# Patient Record
Sex: Male | Born: 1955 | Race: White | Hispanic: No | Smoking: Current every day smoker
Health system: Southern US, Community
[De-identification: ages and names within clinical notes are randomized; demographics above are authoritative.]

---

## 2008-10-14 ENCOUNTER — Emergency Department (HOSPITAL_COMMUNITY): Admission: EM | Admit: 2008-10-14 | Discharge: 2008-10-15 | Payer: Self-pay | Admitting: Emergency Medicine

## 2014-01-22 ENCOUNTER — Encounter (HOSPITAL_COMMUNITY): Payer: Self-pay | Admitting: Emergency Medicine

## 2014-01-22 ENCOUNTER — Emergency Department (HOSPITAL_COMMUNITY)
Admission: EM | Admit: 2014-01-22 | Discharge: 2014-01-23 | Disposition: A | Discharge status: Other Acute Inpatient Hospital | Payer: Federal, State, Local not specified - Other | Attending: Emergency Medicine | Admitting: Emergency Medicine

## 2014-01-22 DIAGNOSIS — Z720 Tobacco use: Secondary | ICD-10-CM | POA: Insufficient documentation

## 2014-01-22 DIAGNOSIS — R000 Tachycardia, unspecified: Secondary | ICD-10-CM | POA: Insufficient documentation

## 2014-01-22 DIAGNOSIS — F10230 Alcohol dependence with withdrawal, uncomplicated: Secondary | ICD-10-CM | POA: Insufficient documentation

## 2014-01-22 LAB — CBC
HEMATOCRIT: 46.7 % (ref 39.0–52.0)
HEMOGLOBIN: 16.3 g/dL (ref 13.0–17.0)
MCH: 34.4 pg — AB (ref 26.0–34.0)
MCHC: 34.9 g/dL (ref 30.0–36.0)
MCV: 98.5 fL (ref 78.0–100.0)
Platelets: 183 10*3/uL (ref 150–400)
RBC: 4.74 MIL/uL (ref 4.22–5.81)
RDW: 12.8 % (ref 11.5–15.5)
WBC: 6.8 10*3/uL (ref 4.0–10.5)

## 2014-01-22 LAB — RAPID URINE DRUG SCREEN, HOSP PERFORMED
Amphetamines: NOT DETECTED
BARBITURATES: NOT DETECTED
Benzodiazepines: NOT DETECTED
COCAINE: NOT DETECTED
OPIATES: NOT DETECTED
TETRAHYDROCANNABINOL: NOT DETECTED

## 2014-01-22 LAB — COMPREHENSIVE METABOLIC PANEL
ALBUMIN: 4.2 g/dL (ref 3.5–5.2)
ALK PHOS: 105 U/L (ref 39–117)
ALT: 142 U/L — ABNORMAL HIGH (ref 0–53)
AST: 139 U/L — ABNORMAL HIGH (ref 0–37)
Anion gap: 17 — ABNORMAL HIGH (ref 5–15)
BUN: 10 mg/dL (ref 6–23)
CO2: 20 mEq/L (ref 19–32)
CREATININE: 0.97 mg/dL (ref 0.50–1.35)
Calcium: 10 mg/dL (ref 8.4–10.5)
Chloride: 96 mEq/L (ref 96–112)
GFR calc Af Amer: >90 mL/min (ref >90)
GFR calc non Af Amer: >90 mL/min (ref >90)
GLUCOSE: 95 mg/dL (ref 70–99)
Potassium: 4.4 mEq/L (ref 3.7–5.3)
Sodium: 133 mEq/L — ABNORMAL LOW (ref 137–147)
TOTAL PROTEIN: 8.3 g/dL (ref 6.0–8.3)
Total Bilirubin: 0.8 mg/dL (ref 0.3–1.2)

## 2014-01-22 LAB — SALICYLATE LEVEL: Salicylate Lvl: <2 mg/dL — ABNORMAL LOW (ref 2.8–20.0)

## 2014-01-22 LAB — ETHANOL: ALCOHOL ETHYL (B): 13 mg/dL — AB (ref 0–11)

## 2014-01-22 LAB — ACETAMINOPHEN LEVEL: Acetaminophen (Tylenol), Serum: <15 ug/mL (ref 10–30)

## 2014-01-22 MED ORDER — ACETAMINOPHEN 325 MG PO TABS
650.0000 mg | ORAL_TABLET | ORAL | Status: DC | PRN
Start: 2014-01-22 — End: 2014-01-23

## 2014-01-22 MED ORDER — VITAMIN B-1 100 MG PO TABS: 100.0000 mg | ORAL_TABLET | Freq: Every day | ORAL | Status: DC

## 2014-01-22 MED ORDER — LORAZEPAM 2 MG/ML IJ SOLN: 0.0000 mg | Freq: Four times a day (QID) | INTRAMUSCULAR | Status: DC

## 2014-01-22 MED ORDER — LORAZEPAM 1 MG PO TABS
0.0000 mg | ORAL_TABLET | Freq: Two times a day (BID) | ORAL | Status: DC
  Administered 2014-01-23: 2 mg via ORAL
  Filled 2014-01-22: qty 2

## 2014-01-22 MED ORDER — ALUM & MAG HYDROXIDE-SIMETH 200-200-20 MG/5ML PO SUSP: 30.0000 mL | ORAL | Status: DC | PRN

## 2014-01-22 MED ORDER — LORAZEPAM 2 MG/ML IJ SOLN: 0.0000 mg | Freq: Two times a day (BID) | INTRAMUSCULAR | Status: DC

## 2014-01-22 MED ORDER — IBUPROFEN 200 MG PO TABS: 600.0000 mg | ORAL_TABLET | Freq: Three times a day (TID) | ORAL | Status: DC | PRN

## 2014-01-22 MED ORDER — ONDANSETRON HCL 4 MG PO TABS: 4.0000 mg | ORAL_TABLET | Freq: Three times a day (TID) | ORAL | Status: DC | PRN

## 2014-01-22 MED ORDER — THIAMINE HCL 100 MG/ML IJ SOLN: 100.0000 mg | Freq: Every day | INTRAMUSCULAR | Status: DC

## 2014-01-22 MED ORDER — VITAMIN B-1 100 MG PO TABS
100.0000 mg | ORAL_TABLET | Freq: Every day | ORAL | Status: DC
  Administered 2014-01-22 – 2014-01-23 (×2): 100 mg via ORAL
  Filled 2014-01-22 (×2): qty 1

## 2014-01-22 MED ORDER — LORAZEPAM 1 MG PO TABS
0.0000 mg | ORAL_TABLET | Freq: Four times a day (QID) | ORAL | Status: DC
  Administered 2014-01-22: 2 mg via ORAL
  Administered 2014-01-23: 1 mg via ORAL
  Filled 2014-01-22: qty 2
  Filled 2014-01-22: qty 1

## 2014-01-22 MED ORDER — NICOTINE 21 MG/24HR TD PT24
21.0000 mg | MEDICATED_PATCH | Freq: Every day | TRANSDERMAL | Status: DC
  Administered 2014-01-22 – 2014-01-23 (×2): 21 mg via TRANSDERMAL
  Filled 2014-01-22 (×2): qty 1

## 2014-01-22 MED ORDER — LORAZEPAM 1 MG PO TABS
1.0000 mg | ORAL_TABLET | Freq: Three times a day (TID) | ORAL | Status: DC | PRN
  Administered 2014-01-22: 1 mg via ORAL
  Filled 2014-01-22: qty 1

## 2014-01-22 NOTE — ED Provider Notes (Signed)
CSN: [id number]     Arrival date & time 01/22/14  1504 History   First MD Initiated Contact with Patient 01/22/14 1532     Chief Complaint  Patient presents with  . etoh detox      (Consider location/radiation/quality/duration/timing/severity/associated sxs/prior Treatment) HPI Comments: Patient is a 58 year old male with no past medical history who presents requesting alcohol detox. Patient reports drinking daily for the past 40 years. He drinks about 6-40oz beers daily. He denies any illicit drug use. He smokes cigarettes daily. He denies SI/HI. No aggravating/alleviaitng factors. No other associated symptoms.    History reviewed. No pertinent past medical history. History reviewed. No pertinent past surgical history. No family history on file. History  Substance Use Topics  . Smoking status: Current Every Day Smoker  . Smokeless tobacco: Not on file  . Alcohol Use: Yes    Review of Systems  Constitutional: Negative for fever, chills and fatigue.       Substance abuse  HENT: Negative for trouble swallowing.   Eyes: Negative for visual disturbance.  Respiratory: Negative for shortness of breath.   Cardiovascular: Negative for chest pain and palpitations.  Gastrointestinal: Negative for nausea, vomiting, abdominal pain and diarrhea.  Genitourinary: Negative for dysuria and difficulty urinating.  Musculoskeletal: Negative for arthralgias and neck pain.  Skin: Negative for color change.  Neurological: Negative for dizziness and weakness.  Psychiatric/Behavioral: Negative for dysphoric mood.      Allergies  Review of patient's allergies indicates no known allergies.  Home Medications   Prior to Admission medications   Not on File   BP 138/87 mmHg  Pulse 109  Temp(Src) 98.6 F (37 C) (Oral)  Resp 20  SpO2 98% Physical Exam  Constitutional: He appears well-developed and well-nourished. No distress.  HENT:  Head: Normocephalic and atraumatic.  Eyes: Conjunctivae  and EOM are normal.  Neck: Normal range of motion.  Cardiovascular: Regular rhythm.  Exam reveals no gallop and no friction rub.   No murmur heard. tachycardic  Pulmonary/Chest: Effort normal and breath sounds normal. He has no wheezes. He has no rales. He exhibits no tenderness.  Abdominal: Soft. He exhibits no distension. There is no tenderness. There is no rebound.  Musculoskeletal: Normal range of motion.  Neurological: He is alert.  Speech is goal-oriented. Generalized tremors consistent with DTs.   Skin: Skin is warm and dry.  Psychiatric: He has a normal mood and affect. His behavior is normal.  Nursing note and vitals reviewed.   ED Course  Procedures (including critical care time) Labs Review Labs Reviewed  CBC - Abnormal; Notable for the following:    MCH 34.4 (*)    All other components within normal limits  COMPREHENSIVE METABOLIC PANEL - Abnormal; Notable for the following:    Sodium 133 (*)    AST 139 (*)    ALT 142 (*)    Anion gap 17 (*)    All other components within normal limits  ETHANOL - Abnormal; Notable for the following:    Alcohol, Ethyl (B) 13 (*)    All other components within normal limits  SALICYLATE LEVEL - Abnormal; Notable for the following:    Salicylate Lvl <2.0 (*)    All other components within normal limits  ACETAMINOPHEN LEVEL  URINE RAPID DRUG SCREEN (HOSP PERFORMED)    Imaging Review No results found.   EKG Interpretation None      MDM   Final diagnoses:  None    3:50 PM Labs pending.  Patient has DTs at the time. Patient is tachycardic with remaining vitals stable.     Emilia BeckKaitlyn Sadhana Frater, PA-C 01/23/14 1517  Gerhard Munchobert Lockwood, MD 01/23/14 1530

## 2014-01-22 NOTE — ED Notes (Signed)
Called PA an informed that pt's ciwa is 16. Pt does not have ativan attached to ciwa protocol. PA stated she would assess pt status.

## 2014-01-22 NOTE — Progress Notes (Signed)
CSW met with Pt. There was no family at bedside. The Pt says he does not feel as though he has a good support system. Pt says he has been drinking for 30 years, but has been drinking excessively for about a year because he has been unemployed.  Pt informed CSW that when he was employed with an income he consumed less alcohol.  Pt is homeless and does not have any insurance on file. CSW will start documentation for [facility] and [facility]. CSW will also give Pt community resources for shelter and food.   , LCSWA [phone] ED CSW 01/22/2014 7:46 PM     

## 2014-01-22 NOTE — ED Notes (Signed)
Pt requesting detox from ETOH, last use right before coming into ED. Denies SI/HI.

## 2014-01-22 NOTE — Progress Notes (Signed)
  CARE MANAGEMENT ED NOTE 01/22/2014  Patient:  Justin Pugh,Justin Pugh   Account Number:  [id number]  Date Initiated:  01/22/2014  Documentation initiated by:  Radford PaxFERRERO,Kyreese Chio  Subjective/Objective Assessment:   Patient presents to Ed with requesting detox from alcohol.     Subjective/Objective Assessment Detail:   Patient without pmhx per chart review     Action/Plan:   TTS consult   Action/Plan Detail:   Anticipated DC Date:       Status Recommendation to Physician:   Result of Recommendation:    Other ED Services  Consult Working Plan   In-house referral  Clinical Social Worker   DC Associate Professorlanning Services  Other  PCP issues    Choice offered to / List presented to:            Status of service:  Completed, signed off  ED Comments:   ED Comments Detail:  EDCM spoke to patient at bedside.  Patient confirms he does [place] have a pcp [place]. Patient reports he is homeless and living in a tent. [facility]   EDCM provide patient with [facility], informed patient of services there and walk in times.  EDCM also provided patient with list of pcps who accept self pay patients, list of discount pharmacies and websites needymeds.org and GoodRX.com for medication assistance, phone number to inquire about the orange card, phone number to inquire about Mediciad, phone number to inquire about the Affordable Care Act, financial resources in the community such as local churches, salvation army, urban ministries, and dental assistance for uninsured patients. EDCM placed consult to EDSW for homelessness.  [facility] discussed patient with EDP at 1753pm regarding DT's?  As per EDP, patient "not really having DT's, he is more alcohol dependent."  Patient is currently on CIWA protocol. EDCM discussed patient with EDRN.  EDCM discussed having disposition for patient within twenty four hours with EDSW. No further EDCM needs at this  time.

## 2014-01-22 NOTE — BH Assessment (Signed)
Assessment Note  Justin Pugh is an 58 y.o. male.  -Clinician talked to [facility], PA about need for TTS.  Patient is requesting detox from ETOH.  No other drug use and no SI or HI.  Patient says that he does want to get detox from ETOH.  He drinks three to four '40s per day for the last year.  Patient drank around 14:00 today prior to coming in.  BAL is only 13 however and that was taken at 16:55.  Patient reports having tremors and cramps, nausea, chills, sweats when detoxing.  Patient denies any HI or SI or any previous attempts.  Pt denies A/V hallucinations.  Depression related to stressors of no job and being homeless & living in a tent.  Patient said that he has a court date on 12/10 for trespassing, misconduct and soliciting.  Patient says that he was told that he needed to get into a detox program prior to this court date and have proof of same.  Clinician called [facility] and spoke to [facility] who said that they could take the referral after 08:30 tomorrow (12/04).  [facility] has no beds available tonight.  No beds at [facility] either.  -Pt care was discussed with Verne Spurr, PA-C who recommended that patient stay at [facility] overnight then be referred out to [facility] in the AM.  Also be evaluated for his detox symptoms at that time.  PA [facility] notified and in agreement.  Axis I: 303.90 ETOH use d/o, severe Axis II: Deferred Axis III: History reviewed. No pertinent past medical history. Axis IV: economic problems, housing problems, occupational problems, problems related to legal system/crime, problems with access to health care services and problems with primary support group Axis V: 31-40 impairment in reality testing  Past Medical History: History reviewed. No pertinent past medical history.  History reviewed. No pertinent past surgical history.  Family History: No family history on file.  Social History:  reports that he has been smoking.  He does not have any smokeless tobacco  history on file. He reports that he drinks alcohol. His drug history is not on file.  Additional Social History:  Alcohol / Drug Use Pain Medications: None Prescriptions: None Over the Counter: None History of alcohol / drug use?: Yes Withdrawal Symptoms: Cramps, Fever / Chills, Nausea / Vomiting, Patient aware of relationship between substance abuse and physical/medical complications, Sweats, Tremors, Weakness Substance #1 Name of Substance 1: ETOH (beer) 1 - Age of First Use: Teens 1 - Amount (size/oz): Three to four 40's per day 1 - Frequency: Daily use 1 - Duration: One year at that rate 1 - Last Use / Amount: 12/03 around 14:00 was last drink  CIWA: CIWA-Ar BP: (!) 147/129 mmHg Pulse Rate: 94 Nausea and Vomiting: 2 Tactile Disturbances: mild itching, pins and needles, burning or numbness Tremor: six Auditory Disturbances: not present Paroxysmal Sweats: two Visual Disturbances: not present Anxiety: two Headache, Fullness in Head: very mild Agitation: somewhat more than normal activity Orientation and Clouding of Sensorium: oriented and can do serial additions CIWA-Ar Total: 16 COWS:    Allergies: No Known Allergies  Home Medications:  (Not in a hospital admission)  OB/GYN Status:  No LMP for male patient.  General Assessment Data Location of Assessment: [facility] ED Is this a Tele or Face-to-Face Assessment?: Face-to-Face Is this an Initial Assessment or a Re-assessment for this encounter?: Initial Assessment Living Arrangements: Other (Comment) (Homeless, living in a tent.) Can pt return to current living arrangement?: Yes Admission  Status: Voluntary Is patient capable of signing voluntary admission?: Yes Transfer from: Acute Hospital Referral Source: Self/Family/Friend     [facility] Living Arrangements: Other (Comment) (Homeless, living in a tent.) Name of Psychiatrist: None Name of Therapist: N/A     Risk to self with the past 6 months Suicidal  Ideation: No Suicidal Intent: No Is patient at risk for suicide?: No Suicidal Plan?: No Access to Means: No What has been your use of drugs/alcohol within the last 12 months?: ETOH use daily Previous Attempts/Gestures: No How many times?: 0 Other Self Harm Risks: None Triggers for Past Attempts: None known Intentional Self Injurious Behavior: None Family Suicide History: No Recent stressful life event(s): Legal Issues, Other (Comment) (Homelessness (living in a tent).) Persecutory voices/beliefs?: Yes Depression: Yes Depression Symptoms: Despondent, Isolating Substance abuse history and/or treatment for substance abuse?: Yes Suicide prevention information given to non-admitted patients: Not applicable  Risk to Others within the past 6 months Homicidal Ideation: No Thoughts of Harm to Others: No Current Homicidal Intent: No Current Homicidal Plan: No Access to Homicidal Means: No Identified Victim: No one History of harm to others?: No Assessment of Violence: None Noted Violent Behavior Description: None noted Does patient have access to weapons?: No Criminal Charges Pending?: Yes Describe Pending Criminal Charges: Trespassing, soliciting & misconduct Does patient have a court date: Yes Court Date: 01/29/14  Psychosis Hallucinations: None noted Delusions: None noted  Mental Status Report Appear/Hygiene: Disheveled, In scrubs, Poor hygiene Eye Contact: Good Motor Activity: Freedom of movement, Restlessness Speech: Logical/coherent Level of Consciousness: Alert Mood: Anxious, Sad Affect: Anxious, Sad Anxiety Level: Moderate Thought Processes: Coherent, Relevant Judgement: Unimpaired Orientation: Person, Place, Time, Situation Obsessive Compulsive Thoughts/Behaviors: None  Cognitive Functioning Concentration: Decreased Memory: Remote Intact, Recent Impaired IQ: Average Insight: Fair Impulse Control: Poor Appetite: Good Weight Loss: 0 Weight Gain: 0 Sleep:  Decreased Total Hours of Sleep: 5 Vegetative Symptoms: None  ADLScreening [facility] Assessment Services) Patient's cognitive ability adequate to safely complete daily activities?: Yes Patient able to express need for assistance with ADLs?: Yes Independently performs ADLs?: Yes (appropriate for developmental age)  Prior Inpatient Therapy Prior Inpatient Therapy: No Prior Therapy Dates: N/A Prior Therapy Facilty/Provider(s): N/A Reason for Treatment: N/A  Prior Outpatient Therapy Prior Outpatient Therapy: No Prior Therapy Dates: N/a Prior Therapy Facilty/Provider(s): N/A Reason for Treatment: N/A  ADL Screening (condition at time of admission) Patient's cognitive ability adequate to safely complete daily activities?: Yes Is the patient deaf or have difficulty hearing?: No Does the patient have difficulty seeing, even when wearing glasses/contacts?: No Does the patient have difficulty concentrating, remembering, or making decisions?: No Patient able to express need for assistance with ADLs?: Yes Does the patient have difficulty dressing or bathing?: No Independently performs ADLs?: Yes (appropriate for developmental age) Does the patient have difficulty walking or climbing stairs?: No Weakness of Legs: None Weakness of Arms/Hands: None  Home Assistive Devices/Equipment Home Assistive Devices/Equipment: None    Abuse/Neglect Assessment (Assessment to be complete while patient is alone) Physical Abuse: Denies Verbal Abuse: Denies Sexual Abuse: Denies Exploitation of patient/patient's resources: Denies Self-Neglect: Denies     Merchant navy officerAdvance Directives (For Healthcare) Does patient have an advance directive?: No Would patient like information on creating an advanced directive?: No - patient declined information    Additional Information 1:1 In Past 12 Months?: No CIRT Risk: No Elopement Risk: No Does patient have medical clearance?: Yes     Disposition:  Disposition Initial  Assessment Completed for this Encounter:  Yes Disposition of Patient: Referred to [facility], [facility]  On Site Evaluation by:   Reviewed with Physician:    Beatriz StallionHarvey, Jayvyn Haselton Ray 01/22/2014 10:15 PM

## 2014-01-22 NOTE — Progress Notes (Signed)
CSW called [facility] and [facility] there is no bed availability. [facility] advised CSW to try again in the morning and [facility] stated they only have 1 male bed available. CSW is gathering Homeless shelter and food bank information to give to Pt.  Trish MageBrittney Lealand Elting, LCSWA [phone] ED CSW 01/22/2014 10:45 PM

## 2014-01-23 ENCOUNTER — Observation Stay (HOSPITAL_COMMUNITY)
Admission: AD | Admit: 2014-01-23 | Discharge: 2014-01-24 | Disposition: A | Discharge status: Home / Self Care | Payer: Federal, State, Local not specified - Other | Source: Intra-hospital | Attending: Psychiatry | Admitting: Psychiatry

## 2014-01-23 ENCOUNTER — Encounter (HOSPITAL_COMMUNITY): Payer: Self-pay | Admitting: *Deleted

## 2014-01-23 DIAGNOSIS — Z590 Homelessness: Secondary | ICD-10-CM | POA: Diagnosis not present

## 2014-01-23 DIAGNOSIS — F10239 Alcohol dependence with withdrawal, unspecified: Principal | ICD-10-CM | POA: Insufficient documentation

## 2014-01-23 DIAGNOSIS — F1020 Alcohol dependence, uncomplicated: Secondary | ICD-10-CM | POA: Diagnosis present

## 2014-01-23 DIAGNOSIS — F1919 Other psychoactive substance abuse with unspecified psychoactive substance-induced disorder: Secondary | ICD-10-CM

## 2014-01-23 DIAGNOSIS — F17210 Nicotine dependence, cigarettes, uncomplicated: Secondary | ICD-10-CM | POA: Insufficient documentation

## 2014-01-23 DIAGNOSIS — F419 Anxiety disorder, unspecified: Secondary | ICD-10-CM | POA: Insufficient documentation

## 2014-01-23 DIAGNOSIS — F10230 Alcohol dependence with withdrawal, uncomplicated: Secondary | ICD-10-CM | POA: Diagnosis present

## 2014-01-23 MED ORDER — LORAZEPAM 1 MG PO TABS: 0.0000 mg | ORAL_TABLET | Freq: Two times a day (BID) | ORAL | Status: DC

## 2014-01-23 MED ORDER — VITAMIN B-1 100 MG PO TABS
100.0000 mg | ORAL_TABLET | Freq: Every day | ORAL | Status: DC
  Administered 2014-01-24: 100 mg via ORAL
  Filled 2014-01-23 (×2): qty 1

## 2014-01-23 MED ORDER — IBUPROFEN 600 MG PO TABS: 600.0000 mg | ORAL_TABLET | Freq: Three times a day (TID) | ORAL | Status: DC | PRN

## 2014-01-23 MED ORDER — ALUM & MAG HYDROXIDE-SIMETH 200-200-20 MG/5ML PO SUSP: 30.0000 mL | ORAL | Status: DC | PRN

## 2014-01-23 MED ORDER — LORAZEPAM 1 MG PO TABS
1.0000 mg | ORAL_TABLET | Freq: Every day | ORAL | Status: DC
Start: 2014-01-26 — End: 2014-01-24

## 2014-01-23 MED ORDER — ACETAMINOPHEN 325 MG PO TABS: 650.0000 mg | ORAL_TABLET | ORAL | Status: DC | PRN

## 2014-01-23 MED ORDER — LORAZEPAM 1 MG PO TABS: 1.0000 mg | ORAL_TABLET | Freq: Two times a day (BID) | ORAL | Status: DC

## 2014-01-23 MED ORDER — PNEUMOCOCCAL 13-VAL CONJ VACC IM SUSP
0.5000 mL | INTRAMUSCULAR | Status: AC
  Administered 2014-01-24: 0.5 mL via INTRAMUSCULAR
  Filled 2014-01-23: qty 0.5

## 2014-01-23 MED ORDER — INFLUENZA VAC SPLIT QUAD 0.5 ML IM SUSY
0.5000 mL | PREFILLED_SYRINGE | INTRAMUSCULAR | Status: AC
  Administered 2014-01-24: 0.5 mL via INTRAMUSCULAR
  Filled 2014-01-23: qty 0.5

## 2014-01-23 MED ORDER — ACETAMINOPHEN 325 MG PO TABS: 650.0000 mg | ORAL_TABLET | Freq: Four times a day (QID) | ORAL | Status: DC | PRN

## 2014-01-23 MED ORDER — LORAZEPAM 1 MG PO TABS
1.0000 mg | ORAL_TABLET | Freq: Three times a day (TID) | ORAL | Status: DC
  Administered 2014-01-24 (×2): 1 mg via ORAL
  Filled 2014-01-23: qty 1

## 2014-01-23 MED ORDER — MAGNESIUM HYDROXIDE 400 MG/5ML PO SUSP: 30.0000 mL | Freq: Every day | ORAL | Status: DC | PRN

## 2014-01-23 MED ORDER — LORAZEPAM 1 MG PO TABS
1.0000 mg | ORAL_TABLET | Freq: Four times a day (QID) | ORAL | Status: AC
  Administered 2014-01-23 (×2): 1 mg via ORAL

## 2014-01-23 MED ORDER — LORAZEPAM 1 MG PO TABS: 0.0000 mg | ORAL_TABLET | Freq: Four times a day (QID) | ORAL | Status: DC

## 2014-01-23 MED ORDER — HYDROXYZINE HCL 25 MG PO TABS: 25.0000 mg | ORAL_TABLET | Freq: Four times a day (QID) | ORAL | Status: DC | PRN

## 2014-01-23 MED ORDER — LORAZEPAM 1 MG PO TABS: 1.0000 mg | ORAL_TABLET | Freq: Three times a day (TID) | ORAL | Status: DC | PRN

## 2014-01-23 MED ORDER — NICOTINE 21 MG/24HR TD PT24
21.0000 mg | MEDICATED_PATCH | Freq: Every day | TRANSDERMAL | Status: DC
  Administered 2014-01-24: 21 mg via TRANSDERMAL
  Filled 2014-01-23 (×2): qty 1

## 2014-01-23 MED ORDER — LOPERAMIDE HCL 2 MG PO CAPS: 2.0000 mg | ORAL_CAPSULE | ORAL | Status: DC | PRN

## 2014-01-23 MED ORDER — ADULT MULTIVITAMIN W/MINERALS CH
1.0000 | ORAL_TABLET | Freq: Every day | ORAL | Status: DC
  Administered 2014-01-24: 1 via ORAL
  Filled 2014-01-23 (×3): qty 1

## 2014-01-23 MED ORDER — ONDANSETRON HCL 4 MG PO TABS: 4.0000 mg | ORAL_TABLET | Freq: Three times a day (TID) | ORAL | Status: DC | PRN

## 2014-01-23 NOTE — H&P (Signed)
Psychiatric Observation Admission Assessment Adult  Patient Identification:  Justin Pugh Date of Evaluation:  01/23/2014 Chief Complaint:  ETOH USE DISORDER   Subjective:   Justin Pugh is a 58 year old Caucasian Male who presented to the [facility] ED for Alcohol Detoxification.  Patient reports he drinks 4-6 40 oz beers every day.  Relates that he has had a stable pattern of use since he was around 58 years old. Denies drug use.  Denies any past psychiatric hospitalization or recovery programs but feels he needs to obtain sobriety as he has multiple misdemeanor charges pending and "everything just keeps getting worse" History of Present Illness: Justin Pugh is a Caucasian male who looks older than his stated age. He presented to the ED for Alcohol Detox and was subsequently admitted to the observation unit for stabilization of his alcohol withdrawal.  Patient has a long history of substance use without a pattern of recovery.  Denies every seeking substance abuse treatment or using a 12 step program and feels that he needs to obtain sobriety as he has been getting into increasingly more trouble while living on the streets.  Patient has multiple pending charges and a court date scheduled for disorderly conduct and trespassing on December 10th.  No past history of psychiatric hospitalization or outpatient management.  No psychotropic medications. No previous SI attempts  Denies signs and symptoms of depression.  Denies Suicidal or Homicidal ideation.  Denies auditory or visual hallucinations.  No evidence of delusions, Does not attend to internal stimuli.  Elements:  Location:  long standing substance use and wanting to obtain sobriety. Quality:  acute. Severity:  moderate. Timing:  acute exacerbation of a chronic problem . Duration:  many years . Context:  homelessness, loss of job. Associated Signs/Synptoms: Depression Symptoms:  anxiety, insomnia, (Hypo) Manic Symptoms:  Denies  Anxiety Symptoms:   Excessive Worry, Psychotic Symptoms: denies  PTSD Symptoms: Negative Total Time spent with patient: 30 minutes  Psychiatric Specialty Exam: Physical Exam  Constitutional: He appears well-developed and well-nourished.  Skin: Skin is warm and dry.  Psychiatric: He has a normal mood and affect. His behavior is normal. Thought content normal.    Review of Systems  Constitutional: Positive for diaphoresis.  HENT: Negative.   Eyes: Negative.   Respiratory: Negative.   Cardiovascular: Negative.   Gastrointestinal: Negative.   Skin: Negative.   Neurological: Positive for tremors.  Psychiatric/Behavioral: Positive for substance abuse. The patient is nervous/anxious and has insomnia.     Blood pressure 132/96, pulse 114, temperature 97.5 F (36.4 C), temperature source Oral, resp. rate 18, height 6' (1.829 m), weight 95.255 kg (210 lb).Body mass index is 28.47 kg/(m^2).  General Appearance: Casual  Eye Contact::  Fair  Speech:  Clear and Coherent  Volume:  Normal  Mood:  Euthymic  Affect:  Congruent and Full Range  Thought Process:  Goal Directed, Linear and Logical  Orientation:  Full (Time, Place, and Person)  Thought Content:  NA  Suicidal Thoughts:  No  Homicidal Thoughts:  No  Memory:  Immediate;   Good Recent;   Good Remote;   Good  Judgement:  Fair  Insight:  Fair  Psychomotor Activity:  Normal  Concentration:  Fair  Recall:  [place] of Knowledge:Good  Language: Good  Akathisia:  No  Handed:  Right  AIMS (if indicated):     Assets:  Communication Skills Desire for Improvement Leisure Time  Sleep:       Musculoskeletal: Strength & Muscle  Tone: within normal limits Gait & Station: normal Patient leans: N/A  Past Psychiatric History: Diagnosis:  Hospitalizations:  Outpatient Care:  Substance Abuse Care:  Self-Mutilation:  Suicidal Attempts:  Violent Behaviors:   Past Medical History:  History reviewed. No pertinent past medical  history. None. Allergies:  No Known Allergies PTA Medications: No prescriptions prior to admission    Previous Psychotropic Medications:  Medication/Dose  No medications               Substance Abuse History in the last 12 months:  Yes.    Consequences of Substance Abuse: Medical Consequences:  elevated liver enzymes  Legal Consequences:  charges for disorderly conduct/ past DUI - served 4 years  Withdrawal Symptoms:   Diaphoresis Nausea Tremors  Social History:  reports that he has been smoking.  He does not have any smokeless tobacco history on file. He reports that he drinks alcohol. His drug history is not on file. 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 No pertinent family history.  Results for orders placed or performed during the hospital encounter of 01/22/14 (from the past 72 hour(s))  Acetaminophen level     Status: None   Collection Time: 01/22/14  4:22 PM  Result Value Ref Range   Acetaminophen (Tylenol), Serum <15.0 10 - 30 ug/mL    Comment:        THERAPEUTIC CONCENTRATIONS VARY SIGNIFICANTLY. A RANGE OF 10-30 ug/mL MAY BE AN EFFECTIVE CONCENTRATION FOR MANY PATIENTS. HOWEVER, SOME ARE BEST TREATED AT CONCENTRATIONS OUTSIDE THIS RANGE. ACETAMINOPHEN CONCENTRATIONS >150 ug/mL AT 4 HOURS AFTER INGESTION AND >50 ug/mL AT  12 HOURS AFTER INGESTION ARE OFTEN ASSOCIATED WITH TOXIC REACTIONS.   CBC     Status: Abnormal   Collection Time: 01/22/14  4:22 PM  Result Value Ref Range   WBC 6.8 4.0 - 10.5 K/uL   RBC 4.74 4.22 - 5.81 MIL/uL   Hemoglobin 16.3 13.0 - 17.0 g/dL   HCT 46.7 39.0 - 52.0 %   MCV 98.5 78.0 - 100.0 fL   MCH 34.4 (H) 26.0 - 34.0 pg   MCHC 34.9 30.0 - 36.0 g/dL   RDW 12.8 11.5 - 15.5 %   Platelets 183 150 - 400 K/uL  Comprehensive metabolic panel     Status: Abnormal   Collection Time: 01/22/14  4:22 PM  Result Value Ref Range   Sodium 133 (L) 137 - 147 mEq/L   Potassium 4.4 3.7 - 5.3 mEq/L   Chloride 96 96 - 112 mEq/L   CO2 20 19 - 32 mEq/L   Glucose, Bld 95 70 - 99 mg/dL   BUN 10 6 - 23 mg/dL   Creatinine, Ser 0.97 0.50 - 1.35 mg/dL   Calcium 10.0 8.4 - 10.5 mg/dL   Total Protein 8.3 6.0 - 8.3  g/dL   Albumin 4.2 3.5 - 5.2 g/dL   AST 139 (H) 0 - 37 U/L    Comment: NO VISIBLE HEMOLYSIS HEMOLYSIS AT THIS LEVEL MAY AFFECT RESULT    ALT 142 (H) 0 - 53 U/L   Alkaline Phosphatase 105 39 - 117 U/L   Total Bilirubin 0.8 0.3 - 1.2 mg/dL   GFR calc non Af Amer >90 >90 mL/min   GFR calc Af Amer >90 >90 mL/min    Comment: (NOTE) The eGFR has been calculated using the CKD EPI equation. This calculation has not been validated in all clinical situations. eGFR's persistently <90 mL/min signify possible Chronic Kidney Disease.    Anion gap 17 (H) 5 - 15  Ethanol (ETOH)     Status: Abnormal   Collection Time: 01/22/14  4:22 PM  Result Value Ref Range   Alcohol, Ethyl (B) 13 (H) 0 - 11 mg/dL    Comment:        LOWEST DETECTABLE LIMIT FOR SERUM ALCOHOL IS 11 mg/dL FOR MEDICAL PURPOSES ONLY   Salicylate level     Status: Abnormal   Collection Time: 01/22/14  4:22 PM  Result Value Ref Range   Salicylate Lvl <0.0 (L) 2.8 - 20.0 mg/dL  Urine Drug Screen     Status: None   Collection Time: 01/22/14  4:36 PM  Result Value Ref Range   Opiates NONE DETECTED NONE DETECTED   Cocaine NONE  DETECTED NONE DETECTED   Benzodiazepines NONE DETECTED NONE DETECTED   Amphetamines NONE DETECTED NONE DETECTED   Tetrahydrocannabinol NONE DETECTED NONE DETECTED   Barbiturates NONE DETECTED NONE DETECTED    Comment:        DRUG SCREEN FOR MEDICAL PURPOSES ONLY.  IF CONFIRMATION IS NEEDED FOR ANY PURPOSE, NOTIFY LAB WITHIN 5 DAYS.        LOWEST DETECTABLE LIMITS FOR URINE DRUG SCREEN Drug Class       Cutoff (ng/mL) Amphetamine      1000 Barbiturate      200 Benzodiazepine   867 Tricyclics       619 Opiates          300 Cocaine          300 THC              50    Psychological Evaluations:  Assessment:   DSM5:  Alcohol dependence with uncomplicated withdrawal   Schizophrenia Disorders:none Obsessive-Compulsive Disorders: none Trauma-Stressor Disorders:  none Substance/Addictive Disorders:  Alcohol Related Disorder - Severe (303.90) and Alcohol Withdrawal without Perceptual Disturbances (F10.239) Depressive Disorders: none  AXIS I:  Substance Abuse AXIS II:  Deferred AXIS III:  History reviewed. No pertinent past medical history. AXIS IV:  economic problems, housing problems, occupational problems, problems with access to health care services and problems with primary support group AXIS V:  51-60 moderate symptoms  Treatment Plan/Recommendations:  Admit to Observation Unit for Alochol Detox  Discussed importance of sobriety and continued substance recovery/ elevation of liver enzymes with patient.  Discussed  need for patient to follow up with primary care physician in 2 weeks for recheck of liver function.  Treatment Plan Summary: Daily contact with patient to assess and evaluate symptoms and progress in treatment Medication management patient placed on CIWA - Ativan protocol for alcohol withdrawal   Discussed 23 hour observation status and patient verbalizes understanding  Current Medications:  No current facility-administered medications for this encounter.     Observation Level/Precautions:  Continuous Observation Detox  Laboratory:  CBC Chemistry Profile UDS UA  Completed in the [facility] ED   Psychotherapy:    Medications:  Ativan Detox Protocol   Consultations:  Social work for outpatient referrals for substance dependency   Discharge Concerns:   Safety   Estimated LOS:  23 observation   Other:      Kennedy Bucker  PMH-NP  12/4/20152:40 PM

## 2014-01-23 NOTE — Progress Notes (Signed)
Patient met resting in bed. Reports sweating and hot flashes as his major symptoms. Patient applied cold-wet towel on his forehead. Denies pain, SI, AH/VH. Cooperative and pleasant. Will continue to monitor patient.

## 2014-01-23 NOTE — Progress Notes (Signed)
Patient ID: Justin Pugh, male   DOB: Apr 25, 1955, 58 y.o.   MRN: [id number] Admission Note-[facility]. He states he has been drinking alcohol since age 58 and has not had any sobriety. He has recently incurred some legal charges related to his panhandling, and has charges and three court dates, 12/10,1/13, and 1/22. He has not support. He is homeless and unemployed. He would like to get his life turned around and get work, housing and he believes he can. He was encouraged to get an evaluation to help his court date and is open to our direction.He is pleasant and cooperative. He denies any thoughts to hurt self and others,and is not psychotic. He has never had previous detox treatment and doesn't have any mental health treatment. He states he feels depressed mostly related to his housing situation. Oriented to the unit and given food and drink once here.

## 2014-01-23 NOTE — Progress Notes (Signed)
[facility] INPATIENT:  Family/Significant Other Suicide Prevention Education  Suicide Prevention Education:  Patient Refusal for Family/Significant Other Suicide Prevention Education: The patient Justin Pugh has refused to provide written consent for family/significant other to be provided Family/Significant Other Suicide Prevention Education during admission and/or prior to discharge.  Physician notified.  Wynona LunaBeck, Cletis Muma K 01/23/2014, 2:41 PM

## 2014-01-23 NOTE — Consult Note (Signed)
[facility] Psychiat[facility] Consult   Reason for Consult:  Request for detox  Referring Physician:  EDP  Justin Pugh is an 58 y.o. male. Total Time spent with patient: 45 minutes  Assessment: AXIS I:  Substance Abuse AXIS II:  Deferred AXIS III:  History reviewed. No pertinent past medical history. AXIS IV:  economic problems, housing problems and occupational problems AXIS V:  51-60 moderate symptoms  Plan:  Supportive therapy provided about ongoing stressors. admit to [facility] observation unit for alcohol detoxification   Subjective:   Justin Pugh is a 58 y.o. male patient presented to the emergency department for alcohol detoxification  HPI:  Patient is a 58 year old male with no past medical history who presents requesting alcohol detox. Patient reports drinking daily for the past 40 years. He drinks about 6-40oz beers daily. He denies any illicit drug use. He smokes cigarettes daily. Patient reports long history of substance abuse and is noted to have fine motor tremors this am.  Patient denies feelings of depression, d Suicidal ideation, homicidal ideation, auditory or visual hallucinations.  Patient reports that he is currently homeless and drinks to cope with his problems and to avoid symptoms of withdrawal.  Patient blood alcohol at time of admission was 13.  Urine drug screen was unremarkable.   HPI Elements:   Location:  generalized. Quality:  acute. Severity:  moderate. Timing:  constant. Duration:  acute exacerbation of a chronic problem . Context:  stressors and lack of housing .  Past Psychiatric History: History reviewed. No pertinent past medical history.  reports that he has been smoking.  He does not have any smokeless tobacco history on file. He reports that he drinks alcohol. His drug history is not on file. No family history on file. Family History Substance Abuse: No Family Supports: No Living Arrangements: Other (Comment) (Homeless, living in a tent.) Can pt  return to current living arrangement?: Yes Abuse/Neglect [facility]) Physical Abuse: Denies Verbal Abuse: Denies Sexual Abuse: Denies Allergies:  No Known Allergies  ACT Assessment Complete:  Yes:    Educational Status    Risk to Self: Risk to self with the past 6 months Suicidal Ideation: No Suicidal Intent: No Is patient at risk for suicide?: No Suicidal Plan?: No Access to Means: No What has been your use of drugs/alcohol within the last 12 months?: ETOH use daily Previous Attempts/Gestures: No How many times?: 0 Other Self Harm Risks: None Triggers for Past Attempts: None known Intentional Self Injurious Behavior: None Family Suicide History: No Recent stressful life event(s): Legal Issues, Other (Comment) (Homelessness (living in a tent).) Persecutory voices/beliefs?: Yes Depression: Yes Depression Symptoms: Despondent, Isolating Substance abuse history and/or treatment for substance abuse?: Yes Suicide prevention information given to non-admitted patients: Not applicable  Risk to Others: Risk to Others within the past 6 months Homicidal Ideation: No Thoughts of Harm to Others: No Current Homicidal Intent: No Current Homicidal Plan: No Access to Homicidal Means: No Identified Victim: No one History of harm to others?: No Assessment of Violence: None Noted Violent Behavior Description: None noted Does patient have access to weapons?: No Criminal Charges Pending?: Yes Describe Pending Criminal Charges: Trespassing, soliciting & misconduct Does patient have a court date: Yes Court Date: 01/29/14  Abuse: Abuse/Neglect Assessment (Assessment to be complete while patient is alone) Physical Abuse: Denies Verbal Abuse: Denies Sexual Abuse: Denies Exploitation of patient/patient's resources: Denies Self-Neglect: Denies  Prior Inpatient Therapy: Prior Inpatient Therapy Prior Inpatient Therapy: No Prior Therapy Dates: N/A Prior  Therapy Facilty/Provider(s): N/A Reason for  Treatment: N/A  Prior Outpatient Therapy: Prior Outpatient Therapy Prior Outpatient Therapy: No Prior Therapy Dates: N/a Prior Therapy Facilty/Provider(s): N/A Reason for Treatment: N/A  Additional Information: Additional Information 1:1 In Past 12 Months?: No CIRT Risk: No Elopement Risk: No Does patient have medical clearance?: Yes                  Objective: Blood pressure 148/87, pulse 88, temperature 97.5 F (36.4 C), temperature source Oral, resp. rate 19, SpO2 99 %.There is no height or weight on file to calculate BMI. Results for orders placed or performed during the hospital encounter of 01/22/14 (from the past 72 hour(s))  Acetaminophen level     Status: None   Collection Time: 01/22/14  4:22 PM  Result Value Ref Range   Acetaminophen (Tylenol), Serum <15.0 10 - 30 ug/mL    Comment:        THERAPEUTIC CONCENTRATIONS VARY SIGNIFICANTLY. A RANGE OF 10-30 ug/mL MAY BE AN EFFECTIVE CONCENTRATION FOR MANY PATIENTS. HOWEVER, SOME ARE BEST TREATED AT CONCENTRATIONS OUTSIDE THIS RANGE. ACETAMINOPHEN CONCENTRATIONS >150 ug/mL AT 4 HOURS AFTER INGESTION AND >50 ug/mL AT 12 HOURS AFTER INGESTION ARE OFTEN ASSOCIATED WITH TOXIC REACTIONS.   CBC     Status: Abnormal   Collection Time: 01/22/14  4:22 PM  Result Value Ref Range   WBC 6.8 4.0 - 10.5 K/uL   RBC 4.74 4.22 - 5.81 MIL/uL   Hemoglobin 16.3 13.0 - 17.0 g/dL   HCT 46.7 39.0 - 52.0 %   MCV 98.5 78.0 - 100.0 fL   MCH 34.4 (H) 26.0 - 34.0 pg   MCHC 34.9 30.0 - 36.0 g/dL   RDW 12.8 11.5 - 15.5 %   Platelets 183 150 - 400 K/uL  Comprehensive metabolic panel     Status: Abnormal   Collection Time: 01/22/14  4:22 PM  Result Value Ref Range   Sodium 133 (L) 137 - 147 mEq/L   Potassium 4.4 3.7 - 5.3 mEq/L   Chloride 96 96 - 112 mEq/L   CO2 20 19 - 32 mEq/L   Glucose, Bld 95 70 - 99 mg/dL   BUN 10 6 - 23 mg/dL   Creatinine, Ser 0.97 0.50 - 1.35 mg/dL   Calcium 10.0 8.4 - 10.5 mg/dL   Total Protein  8.3 6.0 - 8.3 g/dL   Albumin 4.2 3.5 - 5.2 g/dL   AST 139 (H) 0 - 37 U/L    Comment: NO VISIBLE HEMOLYSIS HEMOLYSIS AT THIS LEVEL MAY AFFECT RESULT    ALT 142 (H) 0 - 53 U/L   Alkaline Phosphatase 105 39 - 117 U/L   Total Bilirubin 0.8 0.3 - 1.2 mg/dL   GFR calc non Af Amer >90 >90 mL/min   GFR calc Af Amer >90 >90 mL/min    Comment: (NOTE) The eGFR has been calculated using the CKD EPI equation. This calculation has not been validated in all clinical situations. eGFR's persistently <90 mL/min signify possible Chronic Kidney Disease.    Anion gap 17 (H) 5 - 15  Ethanol (ETOH)     Status: Abnormal   Collection Time: 01/22/14  4:22 PM  Result Value Ref Range   Alcohol, Ethyl (B) 13 (H) 0 - 11 mg/dL    Comment:        LOWEST DETECTABLE LIMIT FOR SERUM ALCOHOL IS 11 mg/dL FOR MEDICAL PURPOSES ONLY   Salicylate level     Status: Abnormal   Collection Time: 01/22/14  4:22 PM  Result Value Ref Range   Salicylate Lvl <6.7 (L) 2.8 - 20.0 mg/dL  Urine Drug Screen     Status: None   Collection Time: 01/22/14  4:36 PM  Result Value Ref Range   Opiates NONE DETECTED NONE DETECTED   Cocaine NONE DETECTED NONE DETECTED   Benzodiazepines NONE DETECTED NONE DETECTED   Amphetamines NONE DETECTED NONE DETECTED   Tetrahydrocannabinol NONE DETECTED NONE DETECTED   Barbiturates NONE DETECTED NONE DETECTED    Comment:        DRUG SCREEN FOR MEDICAL PURPOSES ONLY.  IF CONFIRMATION IS NEEDED FOR ANY PURPOSE, NOTIFY LAB WITHIN 5 DAYS.        LOWEST DETECTABLE LIMITS FOR URINE DRUG SCREEN Drug Class       Cutoff (ng/mL) Amphetamine      1000 Barbiturate      200 Benzodiazepine   672 Tricyclics       094 Opiates          300 Cocaine          300 THC              50    Labs are reviewed and are pertinent for elevated AST 139 and ALT 142  .  Current Facility-Administered Medications  Medication Dose Route Frequency Provider Last Rate Last Dose  . acetaminophen (TYLENOL) tablet 650 mg   650 mg Oral Q4H PRN Alvina Chou, PA-C      . alum & mag hydroxide-simeth (MAALOX/MYLANTA) 200-200-20 MG/5ML suspension 30 mL  30 mL Oral PRN Alvina Chou, PA-C      . ibuprofen (ADVIL,MOTRIN) tablet 600 mg  600 mg Oral Q8H PRN Alvina Chou, PA-C      . LORazepam (ATIVAN) injection 0-4 mg  0-4 mg Intravenous 4 times per day Alvina Chou, PA-C   Stopped at 01/23/14 0516  . LORazepam (ATIVAN) injection 0-4 mg  0-4 mg Intravenous Q12H Alvina Chou, PA-C   Stopped at 01/23/14 [phone]  . LORazepam (ATIVAN) tablet 0-4 mg  0-4 mg Oral 4 times per day Alvina Chou, PA-C   1 mg at 01/23/14 1148  . LORazepam (ATIVAN) tablet 0-4 mg  0-4 mg Oral Q12H Kaitlyn Szekalski, PA-C   2 mg at 01/23/14 0853  . LORazepam (ATIVAN) tablet 1 mg  1 mg Oral Q8H PRN Alvina Chou, PA-C   1 mg at 01/22/14 1712  . nicotine (NICODERM CQ - dosed in mg/24 hours) patch 21 mg  21 mg Transdermal Daily Kaitlyn Szekalski, PA-C   21 mg at 01/23/14 0920  . ondansetron (ZOFRAN) tablet 4 mg  4 mg Oral Q8H PRN Alvina Chou, PA-C      . thiamine (VITAMIN B-1) tablet 100 mg  100 mg Oral Daily Kaitlyn Szekalski, PA-C   100 mg at 01/23/14 0920   Or  . thiamine (B-1) injection 100 mg  100 mg Intravenous Daily Kaitlyn Szekalski, PA-C      . thiamine (B-1) injection 100 mg  100 mg Intravenous Daily Alvina Chou, PA-C   Stopped at 01/23/14 2836  . thiamine (VITAMIN B-1) tablet 100 mg  100 mg Oral Daily Alvina Chou, PA-C   Stopped at 01/23/14 6294   No current outpatient prescriptions on file.    Psychiatric Specialty Exam:     Blood pressure 148/87, pulse 88, temperature 97.5 F (36.4 C), temperature source Oral, resp. rate 19, SpO2 99 %.There is no height or weight on file to calculate BMI.  General Appearance: Casual and Fairly  Groomed  Engineer, water::  Fair  Speech:  Clear and Coherent  Volume:  Normal  Mood:  Euthymic  Affect:  Congruent  Thought Process:  Coherent, Goal Directed, Linear  and Logical  Orientation:  Full (Time, Place, and Person)  Thought Content:  WDL  Suicidal Thoughts:  No  Homicidal Thoughts:  No  Memory:  Immediate;   Good Recent;   Good Remote;   Good  Judgement:  Fair  Insight:  Fair  Psychomotor Activity:  Normal  Concentration:  Fair  Recall:  Good  Fund of Knowledge:Good  Language: Good  Akathisia:  No  Handed:  Right  AIMS (if indicated):     Assets:  Communication Skills Desire for Improvement Leisure Time  Sleep:      Musculoskeletal: Strength & Muscle Tone: within normal limits Gait & Station: normal Patient leans: N/A  Treatment Plan Summary: admit to [facility] observation unit for stabilization of withdrawal symptoms   Kennedy Bucker PMH-NP  01/23/2014 1:26 PM  Patient seen, evaluated and I agree with notes by Nurse Practitioner. Corena Pilgrim, MD

## 2014-01-23 NOTE — BH Assessment (Signed)
[facility] Assessment Progress Note  [facility] by Thedore MinsMojeed Akintayo, MD.  This writer was also asked to pursue placement at [facility] and [facility], as these referrals had already been initiated.  [facility], pt has been declined due to pending legal charges.  [facility] has received his referral information and is reviewing it at this time.  Pt has signed Voluntary Admission and Consent for Treatment, as well as Consent to Release Information, and these [facility].  Pt's nurse, Hardie LoraLilibeth, has been informed.  She agrees to send original paperwork with pt via [facility] [place], and to call report to [phone] or [phone].  Doylene Canninghomas Shanikka Wonders, MA Triage Specialist 01/23/2014 @ 13:19

## 2014-01-23 NOTE — ED Notes (Signed)
3 personal belonging bags endorsed to Transport.

## 2014-01-24 DIAGNOSIS — F1014 Alcohol abuse with alcohol-induced mood disorder: Secondary | ICD-10-CM

## 2014-01-24 DIAGNOSIS — F329 Major depressive disorder, single episode, unspecified: Secondary | ICD-10-CM

## 2014-01-24 DIAGNOSIS — F10239 Alcohol dependence with withdrawal, unspecified: Secondary | ICD-10-CM | POA: Diagnosis not present

## 2014-01-24 MED ORDER — HYDROXYZINE HCL 25 MG PO TABS: 25.0000 mg | ORAL_TABLET | Freq: Four times a day (QID) | ORAL | Status: DC | PRN

## 2014-01-24 NOTE — BH Assessment (Signed)
[facility] Assessment Progress Note    Pt given outpatient resources to follow up with by this clinician.  Casimer LaniusKristen Mar Zettler, MS, [facility] Licensed Professional Counselor Therapeutic Triage Specialist [facility] Phone: [phone] Fax: [phone]

## 2014-01-24 NOTE — Progress Notes (Signed)
Pt alert, oriented and cooperative. Affect/mood anxious but pleasant. Denies pain or discomfort. -SI/HI, -A/[place]. Emotional support and encouragement given. Will continue to monitor closely and evaluate for stabilization.

## 2014-01-24 NOTE — Progress Notes (Signed)
Discharge instructions reviewed with patient. Outpatient resources for alcoholism and substance abuse provided by Counselor. Denies SI/HI, -A/[place]. Denies pain or discomfort.

## 2014-01-24 NOTE — Progress Notes (Signed)
Pt alert, oriented and cooperative. Pt discharged home. Bus pass given. All personal belongings returned to patient. Pt agrees to follow up with outpatient resources provided.

## 2014-01-24 NOTE — Discharge Summary (Signed)
[facility] [facility] OBS UNIT DISCHARGE SUMMARY  Patient Identification:  Justin Pugh Date of Evaluation:  01/24/2014 Chief Complaint:  ETOH USE DISORDER   Subjective:   Justin Pugh is a 58 year old Caucasian Male who presented to the [facility] ED for Alcohol Detoxification.  Pt seen and chart reviewed. Pt denies SI, HI, and AVH, contracts for safety. Pt reports that he is feeling much better after spending the night in the [facility] OBS Unit and that he would like outpatient treatment. Pt reports that he wants "to do the right thing" regarding being present for his upcoming court date. Pt's CIWA is 6 at this time but 2 of this score is from chronic tremor which pt reports has been going on for awhile. He denies other withdrawal symptoms at this time.   History of Present Illness: Justin Pugh is a Caucasian male who looks older than his stated age. He presented to the ED for Alcohol Detox and was subsequently admitted to the observation unit for stabilization of his alcohol withdrawal.  Patient has a long history of substance use without a pattern of recovery.  Denies every seeking substance abuse treatment or using a 12 step program and feels that he needs to obtain sobriety as he has been getting into increasingly more trouble while living on the streets.  Patient has multiple pending charges and a court date scheduled for disorderly conduct and trespassing on December 10th.  No past history of psychiatric hospitalization or outpatient management.  No psychotropic medications. No previous SI attempts  Denies signs and symptoms of depression.  Denies Suicidal or Homicidal ideation.  Denies auditory or visual hallucinations.  No evidence of delusions, Does not attend to internal stimuli.   Total Time spent with patient: 25 minutes  Psychiatric Specialty Exam: Physical Exam  Constitutional: He appears well-developed and well-nourished.  Skin: Skin is warm and dry.  Psychiatric: He has a normal mood and affect. His behavior  is normal. Thought content normal.    Review of Systems  Constitutional: Positive for diaphoresis.  HENT: Negative.   Eyes: Negative.   Respiratory: Negative.   Cardiovascular: Negative.   Gastrointestinal: Negative.   Skin: Negative.   Neurological: Positive for tremors.  Psychiatric/Behavioral: Positive for substance abuse. The patient is nervous/anxious and has insomnia.     Blood pressure 137/100, pulse 92, temperature 97.5 F (36.4 C), temperature source Oral, resp. rate 16, height 6' (1.829 m), weight 95.255 kg (210 lb), SpO2 98 %.Body mass index is 28.47 kg/(m^2).  General Appearance: Casual  Eye Contact::  Fair  Speech:  Clear and Coherent  Volume:  Normal  Mood:  Euthymic  Affect:  Congruent and Full Range  Thought Process:  Goal Directed, Linear and Logical  Orientation:  Full (Time, Place, and Person)  Thought Content:  NA  Suicidal Thoughts:  No  Homicidal Thoughts:  No  Memory:  Immediate;   Good Recent;   Good Remote;   Good  Judgement:  Fair  Insight:  Fair  Psychomotor Activity:  Normal  Concentration:  Fair  Recall:  [place] of Knowledge:Good  Language: Good  Akathisia:  No  Handed:  Right  AIMS (if indicated):     Assets:  Communication Skills Desire for Improvement Leisure Time  Sleep:       Musculoskeletal: Strength & Muscle Tone: within normal limits Gait & Station: normal Patient leans: N/A   Past Medical History:  History reviewed. No pertinent past medical history. None. Allergies:  No Known  Allergies PTA Medications: No prescriptions prior to admission    Previous Psychotropic Medications:  Medication/Dose  No medications               Substance Abuse History in the last 12 months:  Yes.    Consequences of Substance Abuse: Medical Consequences:  elevated liver enzymes  Legal Consequences:  charges for disorderly conduct/ past DUI - served 4 years  Withdrawal Symptoms:   Diaphoresis Nausea Tremors  Social  History:  reports that he has been smoking.  He does not have any smokeless tobacco history on file. He reports that he drinks alcohol. His drug history is not on file. 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 No pertinent family history.  Results for orders placed or performed during the hospital encounter of 01/22/14 (from the past 72 hour(s))  Acetaminophen level     Status: None   Collection Time: 01/22/14  4:22 PM  Result Value Ref Range   Acetaminophen (Tylenol), Serum <15.0 10 - 30 ug/mL    Comment:        THERAPEUTIC CONCENTRATIONS VARY SIGNIFICANTLY. A RANGE OF 10-30 ug/mL MAY BE AN EFFECTIVE CONCENTRATION FOR MANY PATIENTS. HOWEVER, SOME ARE BEST TREATED AT CONCENTRATIONS OUTSIDE THIS RANGE. ACETAMINOPHEN CONCENTRATIONS >150 ug/mL AT 4 HOURS AFTER INGESTION AND >50 ug/mL AT 12 HOURS AFTER INGESTION ARE OFTEN ASSOCIATED WITH TOXIC REACTIONS.   CBC     Status: Abnormal   Collection Time: 01/22/14  4:22 PM  Result Value Ref Range   WBC 6.8 4.0 - 10.5 K/uL   RBC 4.74 4.22 - 5.81 MIL/uL   Hemoglobin 16.3 13.0 - 17.0 g/dL   HCT 46.7 39.0 - 52.0 %   MCV 98.5 78.0 - 100.0 fL   MCH 34.4 (H) 26.0 - 34.0 pg   MCHC 34.9 30.0 - 36.0 g/dL   RDW 12.8 11.5 - 15.5 %   Platelets 183 150 - 400 K/uL  Comprehensive metabolic panel     Status: Abnormal    Collection Time: 01/22/14  4:22 PM  Result Value Ref Range   Sodium 133 (L) 137 - 147 mEq/L   Potassium 4.4 3.7 - 5.3 mEq/L   Chloride 96 96 - 112 mEq/L   CO2 20 19 - 32 mEq/L   Glucose, Bld 95 70 - 99 mg/dL   BUN 10 6 - 23 mg/dL   Creatinine, Ser 0.97 0.50 - 1.35 mg/dL   Calcium 10.0 8.4 - 10.5 mg/dL   Total Protein 8.3 6.0 - 8.3 g/dL   Albumin 4.2 3.5 - 5.2 g/dL   AST 139 (H) 0 - 37 U/L    Comment: NO VISIBLE HEMOLYSIS HEMOLYSIS AT THIS LEVEL MAY AFFECT RESULT    ALT 142 (H) 0 - 53 U/L   Alkaline Phosphatase 105 39 - 117 U/L  Total Bilirubin 0.8 0.3 - 1.2 mg/dL   GFR calc non Af Amer >90 >90 mL/min   GFR calc Af Amer >90 >90 mL/min    Comment: (NOTE) The eGFR has been calculated using the CKD EPI equation. This calculation has not been validated in all clinical situations. eGFR's persistently <90 mL/min signify possible Chronic Kidney Disease.    Anion gap 17 (H) 5 - 15  Ethanol (ETOH)     Status: Abnormal   Collection Time: 01/22/14  4:22 PM  Result Value Ref Range   Alcohol, Ethyl (B) 13 (H) 0 - 11 mg/dL    Comment:        LOWEST DETECTABLE LIMIT FOR SERUM ALCOHOL IS 11 mg/dL FOR MEDICAL PURPOSES ONLY   Salicylate level     Status: Abnormal   Collection Time: 01/22/14  4:22 PM  Result Value Ref Range   Salicylate Lvl <1.0 (L) 2.8 - 20.0 mg/dL  Urine Drug Screen     Status: None   Collection Time: 01/22/14  4:36 PM  Result Value Ref Range   Opiates NONE DETECTED NONE DETECTED   Cocaine NONE DETECTED NONE DETECTED   Benzodiazepines NONE DETECTED NONE DETECTED   Amphetamines NONE DETECTED NONE DETECTED   Tetrahydrocannabinol NONE DETECTED NONE DETECTED   Barbiturates NONE DETECTED NONE DETECTED    Comment:        DRUG SCREEN FOR MEDICAL PURPOSES ONLY.  IF CONFIRMATION IS NEEDED FOR ANY PURPOSE, NOTIFY LAB WITHIN 5 DAYS.        LOWEST DETECTABLE LIMITS FOR URINE DRUG SCREEN Drug Class       Cutoff (ng/mL) Amphetamine      1000 Barbiturate       200 Benzodiazepine   175 Tricyclics       102 Opiates          300 Cocaine          300 THC              50    Psychological Evaluations:  Assessment:   DSM5:  Alcohol dependence with uncomplicated withdrawal   Schizophrenia Disorders:none Obsessive-Compulsive Disorders: none Trauma-Stressor Disorders:  none Substance/Addictive Disorders:  Alcohol Related Disorder - Severe (303.90) and Alcohol Withdrawal without Perceptual Disturbances (F10.239) Depressive Disorders: none  AXIS I:  Substance Abuse, substance induced mood disorder, alcohol abuse, MDD AXIS II:  Deferred AXIS III:  History reviewed. No pertinent past medical history. AXIS IV:  economic problems, housing problems, occupational problems, problems with access to health care services and problems with primary support group AXIS V:  51-60 moderate symptoms  Treatment Plan/Recommendations:  Admit to Observation Unit for Alochol Detox  Discussed importance of sobriety and continued substance recovery/ elevation of liver enzymes with patient.  Discussed  need for patient to follow up with primary care physician in 2 weeks for recheck of liver function.  Treatment Plan Summary: Daily contact with patient to assess and evaluate symptoms and progress in treatment Medication management patient placed on CIWA - Ativan protocol for alcohol withdrawal   Discussed 23 hour observation status and patient verbalizes understanding  Current Medications:  Current Facility-Administered Medications  Medication Dose Route Frequency Provider Last Rate Last Dose  . acetaminophen (TYLENOL) tablet 650 mg  650 mg Oral Q4H PRN Waylan Boga, NP      . alum & mag hydroxide-simeth (MAALOX/MYLANTA) 200-200-20 MG/5ML suspension 30 mL  30 mL Oral PRN Waylan Boga, NP      . hydrOXYzine (ATARAX/VISTARIL) tablet 25 mg  25 mg Oral Q6H PRN Nicholaus Bloom, MD      . ibuprofen (ADVIL,MOTRIN) tablet 600 mg  600 mg Oral Q8H PRN Waylan Boga, NP      .  loperamide (IMODIUM) capsule 2-4 mg  2-4 mg Oral PRN Nicholaus Bloom, MD      . LORazepam (ATIVAN) tablet 1 mg  1 mg Oral Q8H PRN Waylan Boga, NP      . LORazepam (ATIVAN) tablet 1 mg  1 mg Oral TID Nicholaus Bloom, MD   1 mg at 01/24/14 9144   Followed by  . [START ON 01/25/2014] LORazepam (ATIVAN) tablet 1 mg  1 mg Oral BID Nicholaus Bloom, MD       Followed by  . [START ON 01/26/2014] LORazepam (ATIVAN) tablet 1 mg  1 mg Oral Daily Nicholaus Bloom, MD      . multivitamin with minerals tablet 1 tablet  1 tablet Oral Daily Nicholaus Bloom, MD   1 tablet at 01/24/14 [phone]  . nicotine (NICODERM CQ - dosed in mg/24 hours) patch 21 mg  21 mg Transdermal Daily Waylan Boga, NP   21 mg at 01/24/14 0738  . ondansetron (ZOFRAN) tablet 4 mg  4 mg Oral Q8H PRN Waylan Boga, NP      . pneumococcal 13-valent conjugate vaccine (PREVNAR 13) injection 0.5 mL  0.5 mL Intramuscular Tomorrow-1000 Kennedy Bucker, NP      . thiamine (VITAMIN B-1) tablet 100 mg  100 mg Oral Daily Waylan Boga, NP   100 mg at 01/24/14 8350   Plan: Discharge home with outpatient resources to followup on for alcoholism and substance abuse. TTS to assist.   Benjamine Mola, FNP-BC 01/24/2014  10:13 AM  Agree with plan

## 2014-02-20 ENCOUNTER — Encounter (HOSPITAL_COMMUNITY): Payer: Self-pay | Admitting: *Deleted

## 2014-02-20 ENCOUNTER — Inpatient Hospital Stay (HOSPITAL_COMMUNITY)
Admission: EM | Admit: 2014-02-20 | Discharge: 2014-03-09 | DRG: 085 | Disposition: A | Discharge status: Rehabilitation Facility | Payer: Medicaid Other | Attending: General Surgery | Admitting: General Surgery

## 2014-02-20 ENCOUNTER — Emergency Department (HOSPITAL_COMMUNITY): Payer: Medicaid Other

## 2014-02-20 DIAGNOSIS — J15211 Pneumonia due to Methicillin susceptible Staphylococcus aureus: Secondary | ICD-10-CM | POA: Diagnosis not present

## 2014-02-20 DIAGNOSIS — Z781 Physical restraint status: Secondary | ICD-10-CM

## 2014-02-20 DIAGNOSIS — G9340 Encephalopathy, unspecified: Secondary | ICD-10-CM | POA: Diagnosis not present

## 2014-02-20 DIAGNOSIS — Y908 Blood alcohol level of 240 mg/100 ml or more: Secondary | ICD-10-CM | POA: Diagnosis present

## 2014-02-20 DIAGNOSIS — S0633AA Contusion and laceration of cerebrum, unspecified, with loss of consciousness status unknown, initial encounter: Secondary | ICD-10-CM

## 2014-02-20 DIAGNOSIS — F17210 Nicotine dependence, cigarettes, uncomplicated: Secondary | ICD-10-CM | POA: Diagnosis present

## 2014-02-20 DIAGNOSIS — S02401A Maxillary fracture, unspecified, initial encounter for closed fracture: Secondary | ICD-10-CM | POA: Diagnosis present

## 2014-02-20 DIAGNOSIS — S065XAA Traumatic subdural hemorrhage with loss of consciousness status unknown, initial encounter: Secondary | ICD-10-CM

## 2014-02-20 DIAGNOSIS — K5900 Constipation, unspecified: Secondary | ICD-10-CM | POA: Diagnosis not present

## 2014-02-20 DIAGNOSIS — S0292XA Unspecified fracture of facial bones, initial encounter for closed fracture: Secondary | ICD-10-CM | POA: Diagnosis present

## 2014-02-20 DIAGNOSIS — Z4659 Encounter for fitting and adjustment of other gastrointestinal appliance and device: Secondary | ICD-10-CM

## 2014-02-20 DIAGNOSIS — Z590 Homelessness: Secondary | ICD-10-CM

## 2014-02-20 DIAGNOSIS — A419 Sepsis, unspecified organism: Secondary | ICD-10-CM | POA: Diagnosis not present

## 2014-02-20 DIAGNOSIS — J9690 Respiratory failure, unspecified, unspecified whether with hypoxia or hypercapnia: Secondary | ICD-10-CM

## 2014-02-20 DIAGNOSIS — S022XXA Fracture of nasal bones, initial encounter for closed fracture: Secondary | ICD-10-CM | POA: Diagnosis present

## 2014-02-20 DIAGNOSIS — T1490XA Injury, unspecified, initial encounter: Secondary | ICD-10-CM

## 2014-02-20 DIAGNOSIS — F10231 Alcohol dependence with withdrawal delirium: Secondary | ICD-10-CM | POA: Diagnosis not present

## 2014-02-20 DIAGNOSIS — D62 Acute posthemorrhagic anemia: Secondary | ICD-10-CM | POA: Diagnosis not present

## 2014-02-20 DIAGNOSIS — T1490 Injury, unspecified: Secondary | ICD-10-CM | POA: Diagnosis present

## 2014-02-20 DIAGNOSIS — J9811 Atelectasis: Secondary | ICD-10-CM | POA: Diagnosis not present

## 2014-02-20 DIAGNOSIS — S069X3A Unspecified intracranial injury with loss of consciousness of 1 hour to 5 hours 59 minutes, initial encounter: Secondary | ICD-10-CM | POA: Diagnosis not present

## 2014-02-20 DIAGNOSIS — R0603 Acute respiratory distress: Secondary | ICD-10-CM

## 2014-02-20 DIAGNOSIS — J9601 Acute respiratory failure with hypoxia: Secondary | ICD-10-CM | POA: Diagnosis not present

## 2014-02-20 DIAGNOSIS — S065X0A Traumatic subdural hemorrhage without loss of consciousness, initial encounter: Secondary | ICD-10-CM | POA: Diagnosis present

## 2014-02-20 DIAGNOSIS — S0219XD Other fracture of base of skull, subsequent encounter for fracture with routine healing: Secondary | ICD-10-CM | POA: Diagnosis not present

## 2014-02-20 DIAGNOSIS — E876 Hypokalemia: Secondary | ICD-10-CM | POA: Diagnosis not present

## 2014-02-20 DIAGNOSIS — R410 Disorientation, unspecified: Secondary | ICD-10-CM | POA: Diagnosis not present

## 2014-02-20 DIAGNOSIS — R509 Fever, unspecified: Secondary | ICD-10-CM

## 2014-02-20 DIAGNOSIS — W19XXXA Unspecified fall, initial encounter: Secondary | ICD-10-CM | POA: Diagnosis present

## 2014-02-20 DIAGNOSIS — S062X9S Diffuse traumatic brain injury with loss of consciousness of unspecified duration, sequela: Secondary | ICD-10-CM | POA: Diagnosis not present

## 2014-02-20 DIAGNOSIS — J189 Pneumonia, unspecified organism: Secondary | ICD-10-CM | POA: Diagnosis not present

## 2014-02-20 DIAGNOSIS — Z01818 Encounter for other preprocedural examination: Secondary | ICD-10-CM

## 2014-02-20 DIAGNOSIS — Y9289 Other specified places as the place of occurrence of the external cause: Secondary | ICD-10-CM | POA: Diagnosis not present

## 2014-02-20 DIAGNOSIS — S06339A Contusion and laceration of cerebrum, unspecified, with loss of consciousness of unspecified duration, initial encounter: Secondary | ICD-10-CM

## 2014-02-20 DIAGNOSIS — Z931 Gastrostomy status: Secondary | ICD-10-CM

## 2014-02-20 DIAGNOSIS — S069X4S Unspecified intracranial injury with loss of consciousness of 6 hours to 24 hours, sequela: Secondary | ICD-10-CM | POA: Diagnosis not present

## 2014-02-20 DIAGNOSIS — F10239 Alcohol dependence with withdrawal, unspecified: Secondary | ICD-10-CM | POA: Diagnosis not present

## 2014-02-20 DIAGNOSIS — S062X9A Diffuse traumatic brain injury with loss of consciousness of unspecified duration, initial encounter: Secondary | ICD-10-CM

## 2014-02-20 DIAGNOSIS — G936 Cerebral edema: Secondary | ICD-10-CM | POA: Diagnosis present

## 2014-02-20 DIAGNOSIS — S020XXA Fracture of vault of skull, initial encounter for closed fracture: Secondary | ICD-10-CM | POA: Diagnosis present

## 2014-02-20 DIAGNOSIS — Z978 Presence of other specified devices: Secondary | ICD-10-CM

## 2014-02-20 DIAGNOSIS — Z95828 Presence of other vascular implants and grafts: Secondary | ICD-10-CM

## 2014-02-20 DIAGNOSIS — I6200 Nontraumatic subdural hemorrhage, unspecified: Secondary | ICD-10-CM | POA: Diagnosis not present

## 2014-02-20 DIAGNOSIS — R1314 Dysphagia, pharyngoesophageal phase: Secondary | ICD-10-CM | POA: Diagnosis not present

## 2014-02-20 DIAGNOSIS — S065X9A Traumatic subdural hemorrhage with loss of consciousness of unspecified duration, initial encounter: Secondary | ICD-10-CM | POA: Diagnosis present

## 2014-02-20 DIAGNOSIS — S0689AA Other specified intracranial injury with loss of consciousness status unknown, initial encounter: Secondary | ICD-10-CM

## 2014-02-20 LAB — PROTIME-INR
INR: 1 (ref 0.00–1.49)
PROTHROMBIN TIME: 13.3 s (ref 11.6–15.2)

## 2014-02-20 LAB — COMPREHENSIVE METABOLIC PANEL
ALBUMIN: 3.9 g/dL (ref 3.5–5.2)
ALK PHOS: 87 U/L (ref 39–117)
ALT: 33 U/L (ref 0–53)
ANION GAP: 13 (ref 5–15)
AST: 40 U/L — ABNORMAL HIGH (ref 0–37)
BILIRUBIN TOTAL: 0.7 mg/dL (ref 0.3–1.2)
BUN: 6 mg/dL (ref 6–23)
CO2: 23 mmol/L (ref 19–32)
Calcium: 8.3 mg/dL — ABNORMAL LOW (ref 8.4–10.5)
Chloride: 102 mEq/L (ref 96–112)
Creatinine, Ser: 0.81 mg/dL (ref 0.50–1.35)
GLUCOSE: 103 mg/dL — AB (ref 70–99)
POTASSIUM: 3.6 mmol/L (ref 3.5–5.1)
Sodium: 138 mmol/L (ref 135–145)
Total Protein: 6.7 g/dL (ref 6.0–8.3)

## 2014-02-20 LAB — RAPID URINE DRUG SCREEN, HOSP PERFORMED
AMPHETAMINES: NOT DETECTED
BARBITURATES: NOT DETECTED
BENZODIAZEPINES: POSITIVE — AB
Cocaine: NOT DETECTED
OPIATES: NOT DETECTED
TETRAHYDROCANNABINOL: NOT DETECTED

## 2014-02-20 LAB — URINALYSIS, ROUTINE W REFLEX MICROSCOPIC
BILIRUBIN URINE: NEGATIVE
GLUCOSE, UA: NEGATIVE mg/dL
HGB URINE DIPSTICK: NEGATIVE
Ketones, ur: 15 mg/dL — AB
LEUKOCYTES UA: NEGATIVE
NITRITE: NEGATIVE
PROTEIN: NEGATIVE mg/dL
Specific Gravity, Urine: 1.02 (ref 1.005–1.030)
Urobilinogen, UA: 0.2 mg/dL (ref 0.0–1.0)
pH: 5 (ref 5.0–8.0)

## 2014-02-20 LAB — CBC
HCT: 45 % (ref 39.0–52.0)
Hemoglobin: 15.7 g/dL (ref 13.0–17.0)
MCH: 34.6 pg — AB (ref 26.0–34.0)
MCHC: 34.9 g/dL (ref 30.0–36.0)
MCV: 99.1 fL (ref 78.0–100.0)
Platelets: 183 10*3/uL (ref 150–400)
RBC: 4.54 MIL/uL (ref 4.22–5.81)
RDW: 12.8 % (ref 11.5–15.5)
WBC: 9.8 10*3/uL (ref 4.0–10.5)

## 2014-02-20 LAB — MRSA PCR SCREENING: MRSA BY PCR: NEGATIVE

## 2014-02-20 LAB — CDS SEROLOGY

## 2014-02-20 LAB — ETHANOL: ALCOHOL ETHYL (B): 309 mg/dL — AB (ref 0–9)

## 2014-02-20 MED ORDER — HYDROMORPHONE HCL 1 MG/ML IJ SOLN
1.0000 mg | INTRAMUSCULAR | Status: DC | PRN
  Administered 2014-02-20 – 2014-02-22 (×21): 1 mg via INTRAVENOUS
  Filled 2014-02-20 (×21): qty 1

## 2014-02-20 MED ORDER — IOHEXOL 300 MG/ML  SOLN
100.0000 mL | Freq: Once | INTRAMUSCULAR | Status: AC | PRN
  Administered 2014-02-20: 100 mL via INTRAVENOUS

## 2014-02-20 MED ORDER — SODIUM CHLORIDE 0.9 % IV BOLUS (SEPSIS)
1000.0000 mL | Freq: Once | INTRAVENOUS | Status: AC
  Administered 2014-02-20: 1000 mL via INTRAVENOUS

## 2014-02-20 MED ORDER — HALOPERIDOL LACTATE 5 MG/ML IJ SOLN
2.0000 mg | Freq: Four times a day (QID) | INTRAMUSCULAR | Status: DC | PRN
  Administered 2014-02-20 – 2014-02-22 (×5): 2 mg via INTRAMUSCULAR
  Filled 2014-02-20 (×5): qty 1

## 2014-02-20 MED ORDER — CETYLPYRIDINIUM CHLORIDE 0.05 % MT LIQD
7.0000 mL | Freq: Two times a day (BID) | OROMUCOSAL | Status: DC
  Administered 2014-02-20 – 2014-02-24 (×9): 7 mL via OROMUCOSAL

## 2014-02-20 MED ORDER — DEXTROSE-NACL 5-0.9 % IV SOLN
INTRAVENOUS | Status: DC
  Administered 2014-02-20 – 2014-02-27 (×12): via INTRAVENOUS
  Administered 2014-02-28: 40 mL/h via INTRAVENOUS
  Administered 2014-03-01 – 2014-03-08 (×10): via INTRAVENOUS

## 2014-02-20 MED ORDER — ONDANSETRON HCL 4 MG/2ML IJ SOLN
4.0000 mg | Freq: Four times a day (QID) | INTRAMUSCULAR | Status: DC | PRN
  Administered 2014-03-05: 4 mg via INTRAVENOUS
  Filled 2014-02-20: qty 2

## 2014-02-20 MED ORDER — MIDAZOLAM HCL 2 MG/2ML IJ SOLN
2.0000 mg | INTRAMUSCULAR | Status: DC | PRN
  Administered 2014-02-20 – 2014-02-22 (×14): 2 mg via INTRAVENOUS
  Filled 2014-02-20 (×14): qty 2

## 2014-02-20 MED ORDER — TETANUS-DIPHTHERIA TOXOIDS TD 5-2 LFU IM INJ
0.5000 mL | INJECTION | Freq: Once | INTRAMUSCULAR | Status: AC
  Administered 2014-02-20: 0.5 mL via INTRAMUSCULAR
  Filled 2014-02-20: qty 0.5

## 2014-02-20 MED ORDER — ONDANSETRON HCL 4 MG PO TABS: 4.0000 mg | ORAL_TABLET | Freq: Four times a day (QID) | ORAL | Status: DC | PRN

## 2014-02-20 NOTE — ED Notes (Signed)
The pt has voided in the bed .  Bed changed

## 2014-02-20 NOTE — ED Notes (Signed)
The pt is startting to wake.  Pulling off 02 mask  .  Attempting to get up.  He has only siad one word   Shit.  Does not attempt to answer questions
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
## 2014-02-20 NOTE — ED Notes (Signed)
The pt just now answered the first question appropriately.  Asking him if he needs to pee.  He replied yes but  Is not

## 2014-02-20 NOTE — ED Notes (Signed)
Dr jenkins at the bedside 
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
## 2014-02-20 NOTE — ED Notes (Signed)
The pt is not responding verbally due to the drugs he was given  enroute

## 2014-02-20 NOTE — ED Notes (Signed)
Dr Lovell Sheehan roused the pt up during his exam his pupils are 3.4 equal and react.  Attempting to get up again
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
## 2014-02-20 NOTE — ED Notes (Signed)
The pt just went  To and returned from c-t pt still medicated.  Moving legs and arms thrashing   No response to question s asked

## 2014-02-20 NOTE — ED Notes (Signed)
Lab here to draw blood.

## 2014-02-20 NOTE — ED Notes (Signed)
The pt is startting to have bruising around his rt orbit now.  Pupils remain equal at 2.0 and reactive.  Agitated with blood draw attempting to get out of bed pulling off  02 mask

## 2014-02-20 NOTE — ED Notes (Signed)
Both pupils equal and react to light size 2.0 bilaterally
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
## 2014-02-20 NOTE — ED Notes (Signed)
Head elevated to 30 degrees

## 2014-02-20 NOTE — ED Notes (Signed)
Dr Luisa Hart at the bedside..  Pt settling back down

## 2014-02-20 NOTE — ED Notes (Signed)
Nasal trumpet removed by ed res.  c-collar remains in place

## 2014-02-20 NOTE — ED Notes (Signed)
One attempt to call report.
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
## 2014-02-20 NOTE — ED Notes (Signed)
Blood on his lips  No active bleeding  From nose or mouth.  Blood in nares

## 2014-02-20 NOTE — ED Notes (Signed)
Report caLLED TO RN ON 3100

## 2014-02-20 NOTE — ED Notes (Signed)
Port chest xray 
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
## 2014-02-21 ENCOUNTER — Inpatient Hospital Stay (HOSPITAL_COMMUNITY): Payer: Medicaid Other

## 2014-02-21 LAB — CBC
HCT: 42.9 % (ref 39.0–52.0)
Hemoglobin: 14.5 g/dL (ref 13.0–17.0)
MCH: 33.7 pg (ref 26.0–34.0)
MCHC: 33.8 g/dL (ref 30.0–36.0)
MCV: 99.8 fL (ref 78.0–100.0)
PLATELETS: 245 10*3/uL (ref 150–400)
RBC: 4.3 MIL/uL (ref 4.22–5.81)
RDW: 12.9 % (ref 11.5–15.5)
WBC: 12.5 10*3/uL — ABNORMAL HIGH (ref 4.0–10.5)

## 2014-02-21 LAB — COMPREHENSIVE METABOLIC PANEL
ALBUMIN: 3.7 g/dL (ref 3.5–5.2)
ALT: 31 U/L (ref 0–53)
ANION GAP: 12 (ref 5–15)
AST: 38 U/L — ABNORMAL HIGH (ref 0–37)
Alkaline Phosphatase: 71 U/L (ref 39–117)
BUN: 6 mg/dL (ref 6–23)
CALCIUM: 8.3 mg/dL — AB (ref 8.4–10.5)
CHLORIDE: 103 meq/L (ref 96–112)
CO2: 22 mmol/L (ref 19–32)
Creatinine, Ser: 0.81 mg/dL (ref 0.50–1.35)
GFR calc Af Amer: >90 mL/min (ref >90)
GLUCOSE: 141 mg/dL — AB (ref 70–99)
POTASSIUM: 3.7 mmol/L (ref 3.5–5.1)
Sodium: 137 mmol/L (ref 135–145)
Total Bilirubin: 0.5 mg/dL (ref 0.3–1.2)
Total Protein: 6.7 g/dL (ref 6.0–8.3)
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
## 2014-02-22 ENCOUNTER — Inpatient Hospital Stay (HOSPITAL_COMMUNITY): Payer: Medicaid Other

## 2014-02-22 MED ORDER — HYDRALAZINE HCL 20 MG/ML IJ SOLN
10.0000 mg | INTRAMUSCULAR | Status: DC | PRN
  Administered 2014-02-23 – 2014-03-03 (×8): 10 mg via INTRAVENOUS
  Filled 2014-02-22 (×9): qty 1

## 2014-02-22 MED ORDER — ACETAMINOPHEN 160 MG/5ML PO SOLN
650.0000 mg | Freq: Four times a day (QID) | ORAL | Status: DC | PRN
  Administered 2014-02-27: 650 mg via ORAL
  Filled 2014-02-22: qty 20.3

## 2014-02-22 MED ORDER — HYDROMORPHONE HCL 1 MG/ML IJ SOLN
1.0000 mg | INTRAMUSCULAR | Status: DC | PRN
  Administered 2014-02-22 – 2014-03-01 (×30): 1 mg via INTRAVENOUS
  Filled 2014-02-22 (×31): qty 1

## 2014-02-22 MED ORDER — LORAZEPAM 2 MG/ML IJ SOLN
0.5000 mg | Freq: Three times a day (TID) | INTRAMUSCULAR | Status: DC | PRN
  Administered 2014-02-22 – 2014-02-23 (×4): 0.5 mg via INTRAVENOUS
  Filled 2014-02-22: qty 1
  Filled 2014-02-22 (×3): qty 0.25
  Filled 2014-02-22: qty 1
  Filled 2014-02-22: qty 0.25
  Filled 2014-02-22 (×2): qty 1

## 2014-02-22 MED ORDER — ACETAMINOPHEN 650 MG RE SUPP
650.0000 mg | RECTAL | Status: DC | PRN
  Administered 2014-02-22: 650 mg via RECTAL
  Filled 2014-02-22: qty 1

## 2014-02-22 MED ORDER — HYDRALAZINE HCL 20 MG/ML IJ SOLN
10.0000 mg | INTRAMUSCULAR | Status: DC | PRN
  Administered 2014-02-22 (×2): 10 mg via INTRAVENOUS
  Filled 2014-02-22 (×2): qty 1

## 2014-02-22 MED ORDER — RISPERIDONE 2 MG PO TBDP
2.0000 mg | ORAL_TABLET | Freq: Every day | ORAL | Status: DC
  Administered 2014-02-22 – 2014-02-24 (×3): 2 mg via ORAL
  Filled 2014-02-22 (×4): qty 1

## 2014-02-22 MED ORDER — METOPROLOL TARTRATE 1 MG/ML IV SOLN
5.0000 mg | INTRAVENOUS | Status: DC | PRN
  Administered 2014-02-22 – 2014-03-06 (×8): 5 mg via INTRAVENOUS
  Filled 2014-02-22 (×6): qty 5
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
## 2014-02-22 NOTE — Progress Notes (Signed)
Apresoline drawn up but never given due to persistent tachycardia. Wasted. MD paged and orders received.

## 2014-02-23 MED ORDER — LORAZEPAM 2 MG/ML IJ SOLN
1.0000 mg | INTRAMUSCULAR | Status: DC | PRN
  Administered 2014-02-23: 1 mg via INTRAVENOUS
  Administered 2014-02-24 – 2014-03-01 (×5): 2 mg via INTRAVENOUS
  Filled 2014-02-23 (×6): qty 1

## 2014-02-23 MED ORDER — DEXMEDETOMIDINE HCL IN NACL 400 MCG/100ML IV SOLN
0.4000 ug/kg/h | INTRAVENOUS | Status: DC
  Administered 2014-02-23 – 2014-02-24 (×6): 0.4 ug/kg/h via INTRAVENOUS
  Administered 2014-02-25 (×2): 0.7 ug/kg/h via INTRAVENOUS
  Administered 2014-02-25: 0.6 ug/kg/h via INTRAVENOUS
  Administered 2014-02-25: 0.4 ug/kg/h via INTRAVENOUS
  Administered 2014-02-25: 0.8 ug/kg/h via INTRAVENOUS
  Administered 2014-02-26 (×3): 1.2 ug/kg/h via INTRAVENOUS
  Administered 2014-02-26 (×3): 0.8 ug/kg/h via INTRAVENOUS
  Administered 2014-02-26: 0.9 ug/kg/h via INTRAVENOUS
  Administered 2014-02-26: 1.1 ug/kg/h via INTRAVENOUS
  Administered 2014-02-26: 0.7 ug/kg/h via INTRAVENOUS
  Administered 2014-02-26: 0.8 ug/kg/h via INTRAVENOUS
  Administered 2014-02-26: 0.9 ug/kg/h via INTRAVENOUS
  Administered 2014-02-27 – 2014-03-01 (×18): 1.2 ug/kg/h via INTRAVENOUS
  Administered 2014-03-01: 1.7 ug/kg/h via INTRAVENOUS
  Administered 2014-03-01: 1.2 ug/kg/h via INTRAVENOUS
  Administered 2014-03-01: 1.5 ug/kg/h via INTRAVENOUS
  Administered 2014-03-01: 1.6 ug/kg/h via INTRAVENOUS
  Administered 2014-03-01: 1.7 ug/kg/h via INTRAVENOUS
  Administered 2014-03-01: 1.2 ug/kg/h via INTRAVENOUS
  Administered 2014-03-01: 1.5 ug/kg/h via INTRAVENOUS
  Administered 2014-03-02 (×2): 1.8 ug/kg/h via INTRAVENOUS
  Administered 2014-03-02: 2 ug/kg/h via INTRAVENOUS
  Administered 2014-03-02 (×5): 1.8 ug/kg/h via INTRAVENOUS
  Administered 2014-03-02: 1.2 ug/kg/h via INTRAVENOUS
  Administered 2014-03-03: 1.5 ug/kg/h via INTRAVENOUS
  Administered 2014-03-03: 2 ug/kg/h via INTRAVENOUS
  Administered 2014-03-03: 1.7 ug/kg/h via INTRAVENOUS
  Administered 2014-03-03: 1.6 ug/kg/h via INTRAVENOUS
  Administered 2014-03-03: 1.7 ug/kg/h via INTRAVENOUS
  Administered 2014-03-03: 1.6 ug/kg/h via INTRAVENOUS
  Administered 2014-03-03: 2 ug/kg/h via INTRAVENOUS
  Administered 2014-03-03: 1.5 ug/kg/h via INTRAVENOUS
  Administered 2014-03-03: 2 ug/kg/h via INTRAVENOUS
  Administered 2014-03-04: 1.8 ug/kg/h via INTRAVENOUS
  Administered 2014-03-04: 1.7 ug/kg/h via INTRAVENOUS
  Administered 2014-03-04 (×3): 2 ug/kg/h via INTRAVENOUS
  Administered 2014-03-04 (×3): 1.7 ug/kg/h via INTRAVENOUS
  Administered 2014-03-04: 2 ug/kg/h via INTRAVENOUS
  Administered 2014-03-05: 1 ug/kg/h via INTRAVENOUS
  Administered 2014-03-05: 1.5 ug/kg/h via INTRAVENOUS
  Administered 2014-03-05: 1.7 ug/kg/h via INTRAVENOUS
  Filled 2014-02-23 (×2): qty 100
  Filled 2014-02-23: qty 50
  Filled 2014-02-23: qty 100
  Filled 2014-02-23 (×2): qty 50
  Filled 2014-02-23 (×4): qty 100
  Filled 2014-02-23 (×2): qty 50
  Filled 2014-02-23: qty 100
  Filled 2014-02-23: qty 50
  Filled 2014-02-23 (×2): qty 100
  Filled 2014-02-23 (×3): qty 50
  Filled 2014-02-23: qty 100
  Filled 2014-02-23: qty 50
  Filled 2014-02-23: qty 100
  Filled 2014-02-23 (×2): qty 50
  Filled 2014-02-23 (×3): qty 100
  Filled 2014-02-23: qty 50
  Filled 2014-02-23: qty 100
  Filled 2014-02-23: qty 50
  Filled 2014-02-23: qty 100
  Filled 2014-02-23 (×2): qty 50
  Filled 2014-02-23: qty 100
  Filled 2014-02-23: qty 50
  Filled 2014-02-23: qty 100
  Filled 2014-02-23 (×2): qty 50
  Filled 2014-02-23 (×4): qty 100
  Filled 2014-02-23: qty 50
  Filled 2014-02-23 (×2): qty 100
  Filled 2014-02-23 (×2): qty 50
  Filled 2014-02-23 (×2): qty 100
  Filled 2014-02-23: qty 50
  Filled 2014-02-23: qty 100
  Filled 2014-02-23: qty 50
  Filled 2014-02-23: qty 100
  Filled 2014-02-23 (×2): qty 50
  Filled 2014-02-23 (×3): qty 100
  Filled 2014-02-23: qty 50
  Filled 2014-02-23: qty 100
  Filled 2014-02-23 (×2): qty 50
  Filled 2014-02-23 (×2): qty 100
  Filled 2014-02-23 (×3): qty 50
  Filled 2014-02-23 (×5): qty 100
  Filled 2014-02-23 (×3): qty 50
  Filled 2014-02-23 (×2): qty 100
  Filled 2014-02-23: qty 50
  Filled 2014-02-23 (×7): qty 100

## 2014-02-23 MED ORDER — DEXTROSE 5 % IV SOLN
2.0000 g | INTRAVENOUS | Status: DC
  Administered 2014-02-23 – 2014-02-25 (×3): 2 g via INTRAVENOUS
  Filled 2014-02-23 (×3): qty 2
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
## 2014-02-23 NOTE — Progress Notes (Signed)
UR completed.  Deronte Solis, RN BSN [facility] CCM Trauma/Neuro ICU Case Manager [phone]  
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
## 2014-02-23 NOTE — Progress Notes (Signed)
Two patent PIVs now. Pt calmer now. Will hold off on PICC for now per Dr. [name].

## 2014-02-24 ENCOUNTER — Inpatient Hospital Stay (HOSPITAL_COMMUNITY): Payer: Medicaid Other

## 2014-02-24 LAB — BASIC METABOLIC PANEL
Anion gap: 11 (ref 5–15)
BUN: 9 mg/dL (ref 6–23)
CHLORIDE: 108 meq/L (ref 96–112)
CO2: 19 mmol/L (ref 19–32)
Calcium: 9 mg/dL (ref 8.4–10.5)
Creatinine, Ser: 0.71 mg/dL (ref 0.50–1.35)
GFR calc non Af Amer: >90 mL/min (ref >90)
Glucose, Bld: 132 mg/dL — ABNORMAL HIGH (ref 70–99)
Potassium: 3.6 mmol/L (ref 3.5–5.1)
Sodium: 138 mmol/L (ref 135–145)

## 2014-02-24 LAB — CBC
HCT: 39.5 % (ref 39.0–52.0)
Hemoglobin: 13.7 g/dL (ref 13.0–17.0)
MCH: 33.7 pg (ref 26.0–34.0)
MCHC: 34.7 g/dL (ref 30.0–36.0)
MCV: 97.1 fL (ref 78.0–100.0)
PLATELETS: 168 10*3/uL (ref 150–400)
RBC: 4.07 MIL/uL — ABNORMAL LOW (ref 4.22–5.81)
RDW: 12.8 % (ref 11.5–15.5)
WBC: 15.5 10*3/uL — ABNORMAL HIGH (ref 4.0–10.5)

## 2014-02-24 LAB — EXPECTORATED SPUTUM ASSESSMENT W GRAM STAIN, RFLX TO RESP C: Special Requests: NORMAL

## 2014-02-24 LAB — GLUCOSE, CAPILLARY
GLUCOSE-CAPILLARY: 131 mg/dL — AB (ref 70–99)
GLUCOSE-CAPILLARY: 141 mg/dL — AB (ref 70–99)

## 2014-02-24 LAB — EXPECTORATED SPUTUM ASSESSMENT W REFEX TO RESP CULTURE

## 2014-02-24 MED ORDER — PIVOT 1.5 CAL PO LIQD
1000.0000 mL | ORAL | Status: DC
  Filled 2014-02-24: qty 1000

## 2014-02-24 MED ORDER — QUETIAPINE FUMARATE 50 MG PO TABS
50.0000 mg | ORAL_TABLET | Freq: Two times a day (BID) | ORAL | Status: DC
  Administered 2014-02-24 – 2014-02-25 (×4): 50 mg
  Filled 2014-02-24 (×7): qty 1

## 2014-02-24 MED ORDER — PRO-STAT SUGAR FREE PO LIQD
30.0000 mL | Freq: Every day | ORAL | Status: DC
Start: 2014-02-24 — End: 2014-02-25
  Administered 2014-02-24: 30 mL
  Filled 2014-02-24 (×2): qty 30

## 2014-02-24 MED ORDER — PIVOT 1.5 CAL PO LIQD
1000.0000 mL | ORAL | Status: DC
  Administered 2014-02-24 – 2014-02-28 (×5): 1000 mL
  Filled 2014-02-24 (×8): qty 1000

## 2014-02-24 MED ORDER — PIVOT 1.5 CAL PO LIQD
1000.0000 mL | ORAL | Status: DC
  Filled 2014-02-24 (×2): qty 1000
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
## 2014-02-24 NOTE — Progress Notes (Signed)
Patient ID: Justin Pugh, male   DOB: 04-01-1955, 59 y.o.   MRN: [id number] Films reviewed with Dr. Jenne PaneBates. Panda placed via R nare using protocol. Will check abd film. Justin GelinasBurke Yvanna Vidas, MD, MPH, FACS Trauma: [phone] General Surgery: [phone]
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
## 2014-02-24 NOTE — Progress Notes (Signed)
Patient ID: Justin Pugh, male   DOB: Jan 13, 1956, 59 y.o.   MRN: [id number] Tried to carefully place panda via L nare. Unable due to coughing. We did get a great sputum sample, however. ST to see as part of [name]. Violeta GelinasBurke Shawny Borkowski, MD, MPH, FACS Trauma: [phone] General Surgery: [phone]
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
## 2014-02-24 NOTE — Progress Notes (Addendum)
 1mg  of Ativan taken out of pyxis to administer to patient. Due to medical condition, pt given 2mg  of ativan instead. MAR is correct. Spoke with pharmacist about correcting the waste in pyxis. Was told to correct this by wasting 1mg  in Pyxis and documenting via progress note that I gave 2mg . See MAR.

## 2014-02-24 NOTE — Progress Notes (Signed)
Girlfriend of patient said note was needed for public defenders office.  Patients mother was called to get consent to send note to Olena LeatherwoodErin Adler, Arts administratorpublic defender.

## 2014-02-24 NOTE — Progress Notes (Signed)
Received prescreen request for inpatient rehab and have reviewed pt's case. Per latest PT note, pt is currently demonstrating behaviors consistent with Rancho II level. We will need pt to demonstrate behaviors at a Rancho III to IV to consider the possibility of rehab consult.  I will follow pt's progress and make recommendations accordingly.   Thanks.  Juliann MuleJanine Onell Mcmath, PT Rehabilitation Admissions Coordinator [phone]
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
## 2014-02-25 ENCOUNTER — Inpatient Hospital Stay (HOSPITAL_COMMUNITY): Payer: Medicaid Other

## 2014-02-25 ENCOUNTER — Inpatient Hospital Stay (HOSPITAL_COMMUNITY): Payer: Medicaid Other | Admitting: Certified Registered"

## 2014-02-25 LAB — POCT I-STAT 3, ART BLOOD GAS (G3+)
ACID-BASE DEFICIT: 3 mmol/L — AB (ref 0.0–2.0)
Acid-base deficit: 5 mmol/L — ABNORMAL HIGH (ref 0.0–2.0)
Bicarbonate: 27 meq/L — ABNORMAL HIGH (ref 20.0–24.0)
Bicarbonate: 20.9 meq/L (ref 20.0–24.0)
O2 Saturation: 93 %
O2 Saturation: 99 %
PH ART: 7.198 — AB (ref 7.350–7.450)
Patient temperature: 100.1
Patient temperature: 100.1
TCO2: 29 mmol/L (ref 0–100)
TCO2: 22 mmol/L (ref 0–100)
pCO2 arterial: 70.1 mmHg (ref 35.0–45.0)
pCO2 arterial: 40.3 mmHg (ref 35.0–45.0)
pH, Arterial: 7.326 — ABNORMAL LOW (ref 7.350–7.450)
pO2, Arterial: 86 mmHg (ref 80.0–100.0)
pO2, Arterial: 163 mmHg — ABNORMAL HIGH (ref 80.0–100.0)

## 2014-02-25 LAB — GLUCOSE, CAPILLARY
GLUCOSE-CAPILLARY: 117 mg/dL — AB (ref 70–99)
GLUCOSE-CAPILLARY: 132 mg/dL — AB (ref 70–99)
Glucose-Capillary: 121 mg/dL — ABNORMAL HIGH (ref 70–99)
Glucose-Capillary: 117 mg/dL — ABNORMAL HIGH (ref 70–99)
Glucose-Capillary: 143 mg/dL — ABNORMAL HIGH (ref 70–99)
Glucose-Capillary: 125 mg/dL — ABNORMAL HIGH (ref 70–99)

## 2014-02-25 LAB — CBC
HEMATOCRIT: 37.6 % — AB (ref 39.0–52.0)
HEMOGLOBIN: 13 g/dL (ref 13.0–17.0)
MCH: 34.6 pg — ABNORMAL HIGH (ref 26.0–34.0)
MCHC: 34.6 g/dL (ref 30.0–36.0)
MCV: 100 fL (ref 78.0–100.0)
Platelets: 171 10*3/uL (ref 150–400)
RBC: 3.76 MIL/uL — ABNORMAL LOW (ref 4.22–5.81)
RDW: 12.8 % (ref 11.5–15.5)
WBC: 12 10*3/uL — ABNORMAL HIGH (ref 4.0–10.5)

## 2014-02-25 LAB — BASIC METABOLIC PANEL
Anion gap: 7 (ref 5–15)
BUN: 17 mg/dL (ref 6–23)
CO2: 24 mmol/L (ref 19–32)
CREATININE: 1.14 mg/dL (ref 0.50–1.35)
Calcium: 8.4 mg/dL (ref 8.4–10.5)
Chloride: 109 mEq/L (ref 96–112)
GFR calc Af Amer: 81 mL/min — ABNORMAL LOW (ref >90)
GFR calc non Af Amer: 70 mL/min — ABNORMAL LOW (ref >90)
GLUCOSE: 180 mg/dL — AB (ref 70–99)
Potassium: 3.5 mmol/L (ref 3.5–5.1)
Sodium: 140 mmol/L (ref 135–145)

## 2014-02-25 LAB — TRIGLYCERIDES: TRIGLYCERIDES: 67 mg/dL (ref <150)

## 2014-02-25 MED ORDER — CETYLPYRIDINIUM CHLORIDE 0.05 % MT LIQD
7.0000 mL | Freq: Four times a day (QID) | OROMUCOSAL | Status: DC
  Administered 2014-02-25 – 2014-03-05 (×31): 7 mL via OROMUCOSAL

## 2014-02-25 MED ORDER — FENTANYL CITRATE 0.05 MG/ML IJ SOLN
100.0000 ug | INTRAMUSCULAR | Status: DC | PRN
  Administered 2014-02-25 – 2014-03-01 (×21): 100 ug via INTRAVENOUS
  Filled 2014-02-25 (×22): qty 2

## 2014-02-25 MED ORDER — LIDOCAINE HCL (CARDIAC) 20 MG/ML IV SOLN
INTRAVENOUS | Status: DC | PRN
  Administered 2014-02-25: 100 mg via INTRAVENOUS

## 2014-02-25 MED ORDER — PROPOFOL 10 MG/ML IV BOLUS
INTRAVENOUS | Status: DC | PRN
  Administered 2014-02-25: 100 mg via INTRAVENOUS

## 2014-02-25 MED ORDER — VANCOMYCIN HCL IN DEXTROSE 1-5 GM/200ML-% IV SOLN
1000.0000 mg | Freq: Three times a day (TID) | INTRAVENOUS | Status: DC
  Administered 2014-02-25 – 2014-02-26 (×2): 1000 mg via INTRAVENOUS
  Filled 2014-02-25 (×3): qty 200

## 2014-02-25 MED ORDER — PROPOFOL 10 MG/ML IV EMUL: 0.0000 ug/kg/min | INTRAVENOUS | Status: DC

## 2014-02-25 MED ORDER — VANCOMYCIN HCL 10 G IV SOLR
1500.0000 mg | Freq: Once | INTRAVENOUS | Status: AC
  Administered 2014-02-25: 1500 mg via INTRAVENOUS
  Filled 2014-02-25: qty 1500

## 2014-02-25 MED ORDER — RISPERIDONE 2 MG PO TABS
2.0000 mg | ORAL_TABLET | Freq: Every day | ORAL | Status: DC
  Administered 2014-02-25 – 2014-02-27 (×3): 2 mg via ORAL
  Filled 2014-02-25 (×4): qty 1

## 2014-02-25 MED ORDER — SUCCINYLCHOLINE CHLORIDE 20 MG/ML IJ SOLN
INTRAMUSCULAR | Status: DC | PRN
  Administered 2014-02-25: 100 mg via INTRAVENOUS

## 2014-02-25 MED ORDER — PROPOFOL 10 MG/ML IV EMUL
INTRAVENOUS | Status: AC
  Filled 2014-02-25: qty 100

## 2014-02-25 MED ORDER — CHLORHEXIDINE GLUCONATE 0.12 % MT SOLN
15.0000 mL | Freq: Two times a day (BID) | OROMUCOSAL | Status: DC
  Administered 2014-02-25 – 2014-03-04 (×14): 15 mL via OROMUCOSAL
  Filled 2014-02-25 (×15): qty 15

## 2014-02-25 MED ORDER — PIPERACILLIN-TAZOBACTAM 3.375 G IVPB
3.3750 g | Freq: Three times a day (TID) | INTRAVENOUS | Status: DC
  Administered 2014-02-25 (×2): 3.375 g via INTRAVENOUS
  Filled 2014-02-25 (×3): qty 50

## 2014-02-25 MED ORDER — SODIUM CHLORIDE 0.9 % IV BOLUS (SEPSIS)
1000.0000 mL | Freq: Once | INTRAVENOUS | Status: AC
  Administered 2014-02-25: 1000 mL via INTRAVENOUS

## 2014-02-25 NOTE — Progress Notes (Signed)
[phone] paged Dr. Magnus IvanBlackman regarding patients 02 sats dropping to high 80's with RR in 40's. Orders given for stat blood gas and chest xray. Patient placed on non-rebreather. [phone] paged Dr. Magnus IvanBlackman again with blood gas results and to notify that 02 sats were continuing to drop. Requested emergent intubation.  [phone] operator was called requesting emergent intubation. Began bagging the patient. Respiratory at bedside.  [id number] Patient intubated. [phone] Dr. Magnus IvanBlackman at bedside. 0300 Patient BP dropped 70's-80's systolic. Orders given for 1 L bolus of saline. Will continue to monitor.

## 2014-02-25 NOTE — Clinical Social Work Note (Signed)
Clinical Social Worker continuing to follow patient and family for support and discharge planning needs.  Patient is now intubated with a Panda in place.  Patient mother works from [employer]5am - 1pm and plans to visit in the afternoons.  Patient mother states that patient is welcome into her [place] in [place] once stable for discharge.  CSW remains available for support and to assist with discharge planning needs once medically stable.  Justin Pugh, Kentucky[place]CSW [phone]
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
## 2014-02-25 NOTE — Progress Notes (Signed)
SLP Cancellation Note  Patient Details Name: Justin Pugh MRN: [id number] DOB: 1955/09/08   Cancelled treatment:        Pt intubated currently. Will follow along for communicative-cognitive intervention  [name], Zanita Millman Willis 02/25/2014, 9:55 AM   [name] [name] Pager [phone]

## 2014-02-25 NOTE — Progress Notes (Signed)
Patient ID: Justin Pugh, male   DOB: 1955/06/14, 59 y.o.   MRN: [id number]  Patient developed persistent decreasing O2 sats and increased O2 requirements.  ABG with CO2 of 70.  CXR looked much worse. Urgently intubated. Sedation protocol started. Repeat CXR pending.  Patient remained hemodynamically stable.

## 2014-02-25 NOTE — Transfer of Care (Signed)
OOD intubation 

## 2014-02-25 NOTE — Progress Notes (Signed)
Justin Pugh paged Neurosurgery answering service regarding patient neuro status. Patient intubated at 0245, no sedation administered since procedure. Patient only withdrawing to pain, not opening eyes or following commands. Dr. Conchita ParisNundkumar recommended to continue to monitor and allow sedation more time to wear off. 0740 patient now opening eyes and localizing to pain. Dr. Lovell SheehanJenkins at bedside. No new orders at this time.
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
## 2014-02-26 LAB — BLOOD GAS, ARTERIAL
ACID-BASE EXCESS: 0.6 mmol/L (ref 0.0–2.0)
BICARBONATE: 24 meq/L (ref 20.0–24.0)
Drawn by: 41977
FIO2: 0.45 %
MECHVT: 620 mL
O2 SAT: 96.6 %
PCO2 ART: 33.8 mmHg — AB (ref 35.0–45.0)
PEEP: 5 cmH2O
Patient temperature: 98.7
RATE: 20 resp/min
TCO2: 25 mmol/L (ref 0–100)
pH, Arterial: 7.465 — ABNORMAL HIGH (ref 7.350–7.450)
pO2, Arterial: 82.3 mmHg (ref 80.0–100.0)

## 2014-02-26 LAB — BASIC METABOLIC PANEL
ANION GAP: 8 (ref 5–15)
BUN: 19 mg/dL (ref 6–23)
CHLORIDE: 112 meq/L (ref 96–112)
CO2: 23 mmol/L (ref 19–32)
Calcium: 8.6 mg/dL (ref 8.4–10.5)
Creatinine, Ser: 0.91 mg/dL (ref 0.50–1.35)
GFR calc non Af Amer: >90 mL/min (ref >90)
Glucose, Bld: 132 mg/dL — ABNORMAL HIGH (ref 70–99)
POTASSIUM: 3.1 mmol/L — AB (ref 3.5–5.1)
Sodium: 143 mmol/L (ref 135–145)

## 2014-02-26 LAB — GLUCOSE, CAPILLARY
Glucose-Capillary: 119 mg/dL — ABNORMAL HIGH (ref 70–99)
Glucose-Capillary: 149 mg/dL — ABNORMAL HIGH (ref 70–99)
Glucose-Capillary: 134 mg/dL — ABNORMAL HIGH (ref 70–99)
Glucose-Capillary: 115 mg/dL — ABNORMAL HIGH (ref 70–99)
Glucose-Capillary: 135 mg/dL — ABNORMAL HIGH (ref 70–99)

## 2014-02-26 LAB — CULTURE, RESPIRATORY

## 2014-02-26 LAB — CULTURE, RESPIRATORY W GRAM STAIN

## 2014-02-26 LAB — CBC WITH DIFFERENTIAL/PLATELET
Basophils Absolute: 0.1 10*3/uL (ref 0.0–0.1)
Basophils Relative: 1 % (ref 0–1)
EOS PCT: 3 % (ref 0–5)
Eosinophils Absolute: 0.3 10*3/uL (ref 0.0–0.7)
HCT: 37.2 % — ABNORMAL LOW (ref 39.0–52.0)
Hemoglobin: 12.7 g/dL — ABNORMAL LOW (ref 13.0–17.0)
LYMPHS PCT: 11 % — AB (ref 12–46)
Lymphs Abs: 1.2 10*3/uL (ref 0.7–4.0)
MCH: 33.8 pg (ref 26.0–34.0)
MCHC: 34.1 g/dL (ref 30.0–36.0)
MCV: 98.9 fL (ref 78.0–100.0)
MONO ABS: 2.2 10*3/uL — AB (ref 0.1–1.0)
Monocytes Relative: 20 % — ABNORMAL HIGH (ref 3–12)
NEUTROS PCT: 65 % (ref 43–77)
Neutro Abs: 7.1 10*3/uL (ref 1.7–7.7)
PLATELETS: 190 10*3/uL (ref 150–400)
RBC: 3.76 MIL/uL — ABNORMAL LOW (ref 4.22–5.81)
RDW: 12.8 % (ref 11.5–15.5)
WBC: 10.9 10*3/uL — AB (ref 4.0–10.5)

## 2014-02-26 MED ORDER — SODIUM CHLORIDE 0.9 % IJ SOLN
10.0000 mL | Freq: Two times a day (BID) | INTRAMUSCULAR | Status: DC
  Administered 2014-02-26 (×2): 20 mL
  Administered 2014-02-27 – 2014-02-28 (×3): 10 mL
  Administered 2014-03-01: 20 mL
  Administered 2014-03-01 – 2014-03-04 (×4): 10 mL
  Administered 2014-03-07: 40 mL
  Administered 2014-03-07: 20 mL
  Administered 2014-03-08: 12 mL

## 2014-02-26 MED ORDER — CEFAZOLIN SODIUM-DEXTROSE 2-3 GM-% IV SOLR
2.0000 g | Freq: Three times a day (TID) | INTRAVENOUS | Status: AC
  Administered 2014-02-26 – 2014-03-08 (×32): 2 g via INTRAVENOUS
  Filled 2014-02-26 (×37): qty 50

## 2014-02-26 MED ORDER — POTASSIUM CHLORIDE 20 MEQ/15ML (10%) PO SOLN
40.0000 meq | Freq: Two times a day (BID) | ORAL | Status: AC
  Administered 2014-02-26 (×2): 40 meq via ORAL
  Filled 2014-02-26 (×3): qty 30

## 2014-02-26 MED ORDER — SODIUM CHLORIDE 0.9 % IJ SOLN
10.0000 mL | INTRAMUSCULAR | Status: DC | PRN
  Administered 2014-03-05: 10 mL
  Filled 2014-02-26: qty 40

## 2014-02-26 MED ORDER — QUETIAPINE FUMARATE 100 MG PO TABS
100.0000 mg | ORAL_TABLET | Freq: Three times a day (TID) | ORAL | Status: DC
  Administered 2014-02-26 – 2014-02-27 (×6): 100 mg
  Filled 2014-02-26 (×9): qty 1
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
## 2014-02-26 NOTE — Progress Notes (Signed)
Patient ID: Justin Pugh, male   DOB: 1955/08/19, 59 y.o.   MRN: [id number] I met with his mother at the bedside and updated her on [name]'s current condition and our plan of care. I answered her questions. She is willing to have him come home with her at D/C but she does work some during the day. Georganna Skeans, MD, MPH, FACS Trauma: [phone] General Surgery: [phone]

## 2014-02-26 NOTE — Progress Notes (Signed)
SLP Cancellation Note  Patient Details Name: Justin Pugh MRN: [id number] DOB: [birth year]   Cancelled treatment:        Continues on vent. Defer tx; if not weaning by tomorrow, likely sign off and request new orders.   [name], [name] 02/26/2014, 7:51 AM   [name] [name] Pager [phone]

## 2014-02-26 NOTE — Progress Notes (Signed)
UR completed.  Await medical stability to be able to determine pt's needs for d/c.   Carlyle LipaMichelle Harlis Champoux, RN BSN MHA CCM Trauma/Neuro ICU Case Manager [phone]

## 2014-02-26 NOTE — Clinical Documentation Improvement (Signed)
Possible Clinical Conditions? Cerebral Edema Brain Herniation Other Condition Cannot Clinically Determine   Supporting Information: Risk Factors:Subdural Hematoma  Diagnostics:CT HEAD 02-24-14 IMPRESSION: Convexity subdural hematoma bilaterally greater right frontal region measuring 13.2 mm versus prior 12 mm. Anterior frontal hemorrhagic contusions greater on the right with largest hemorrhagic component measuring 2.2 cm versus prior 1.8 cm. Increase in amount of surrounding vasogenic edema. Mild impression upon the frontal horns without evidence of midline shift.  Thank You, Nevin BloodgoodJoan B Genevia Bouldin, RN, BSN, CCDS,Clinical Documentation Specialist:  [phone]  [phone]=[facility]- Health Information Management

## 2014-02-26 NOTE — Progress Notes (Signed)
Peripherally Inserted Central Catheter/Midline Placement  The IV Nurse has discussed with the patient and/or persons authorized to consent for the patient, the purpose of this procedure and the potential benefits and risks involved with this procedure.  The benefits include less needle sticks, lab draws from the catheter and patient may be discharged home with the catheter.  Risks include, but not limited to, infection, bleeding, blood clot (thrombus formation), and puncture of an artery; nerve damage and irregular heat beat.  Alternatives to this procedure were also discussed.  PICC/Midline Placement Documentation  PICC / Midline Double Lumen 02/26/14 PICC Right Basilic 47 cm 0 cm (Active)  Indication for Insertion or Continuance of Line Prolonged intravenous therapies 02/26/2014  2:00 PM  Exposed Catheter (cm) 0 cm 02/26/2014  2:00 PM  Dressing Change Due 03/05/14 02/26/2014  2:00 PM    Telephone Consent   Justin Pugh, Justin Pugh 02/26/2014, 2:54 PM
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
## 2014-02-26 NOTE — Progress Notes (Signed)
PT Cancellation Note  Patient Details Name: Justin Pugh MRN: [id number] DOB: 06/15/1955   Cancelled Treatment:    Reason Eval/Treat Not Completed: Patient not medically ready.  Pt sedated on vent at this time.  Spoke with MD who requests hold therapy today.  Will f/u another time.     [name], Alison MurrayMegan F 02/26/2014, 9:51 AM

## 2014-02-27 ENCOUNTER — Inpatient Hospital Stay (HOSPITAL_COMMUNITY): Payer: Medicaid Other

## 2014-02-27 DIAGNOSIS — J9601 Acute respiratory failure with hypoxia: Secondary | ICD-10-CM

## 2014-02-27 DIAGNOSIS — I6200 Nontraumatic subdural hemorrhage, unspecified: Secondary | ICD-10-CM

## 2014-02-27 LAB — GLUCOSE, CAPILLARY
GLUCOSE-CAPILLARY: 117 mg/dL — AB (ref 70–99)
Glucose-Capillary: 118 mg/dL — ABNORMAL HIGH (ref 70–99)
Glucose-Capillary: 135 mg/dL — ABNORMAL HIGH (ref 70–99)
Glucose-Capillary: 136 mg/dL — ABNORMAL HIGH (ref 70–99)
Glucose-Capillary: 145 mg/dL — ABNORMAL HIGH (ref 70–99)
Glucose-Capillary: 163 mg/dL — ABNORMAL HIGH (ref 70–99)

## 2014-02-27 LAB — CBC
HCT: 38.4 % — ABNORMAL LOW (ref 39.0–52.0)
Hemoglobin: 12.9 g/dL — ABNORMAL LOW (ref 13.0–17.0)
MCH: 33.6 pg (ref 26.0–34.0)
MCHC: 33.6 g/dL (ref 30.0–36.0)
MCV: 100 fL (ref 78.0–100.0)
Platelets: 217 10*3/uL (ref 150–400)
RBC: 3.84 MIL/uL — ABNORMAL LOW (ref 4.22–5.81)
RDW: 12.9 % (ref 11.5–15.5)
WBC: 13.1 10*3/uL — ABNORMAL HIGH (ref 4.0–10.5)

## 2014-02-27 LAB — BASIC METABOLIC PANEL
Anion gap: 6 (ref 5–15)
BUN: 17 mg/dL (ref 6–23)
CO2: 24 mmol/L (ref 19–32)
Calcium: 8.6 mg/dL (ref 8.4–10.5)
Chloride: 112 mEq/L (ref 96–112)
Creatinine, Ser: 0.79 mg/dL (ref 0.50–1.35)
GFR calc Af Amer: >90 mL/min (ref >90)
GFR calc non Af Amer: >90 mL/min (ref >90)
GLUCOSE: 189 mg/dL — AB (ref 70–99)
Potassium: 3.3 mmol/L — ABNORMAL LOW (ref 3.5–5.1)
Sodium: 142 mmol/L (ref 135–145)

## 2014-02-27 LAB — BLOOD GAS, ARTERIAL
Acid-base deficit: 1.6 mmol/L (ref 0.0–2.0)
BICARBONATE: 22.2 meq/L (ref 20.0–24.0)
DRAWN BY: 41977
FIO2: 0.45 %
O2 SAT: 98.4 %
PEEP: 5 cmH2O
Patient temperature: 98.6
RATE: 20 resp/min
TCO2: 23.2 mmol/L (ref 0–100)
VT: 620 mL
pCO2 arterial: 34.5 mmHg — ABNORMAL LOW (ref 35.0–45.0)
pH, Arterial: 7.424 (ref 7.350–7.450)
pO2, Arterial: 132 mmHg — ABNORMAL HIGH (ref 80.0–100.0)

## 2014-02-27 LAB — MAGNESIUM: Magnesium: 2.1 mg/dL (ref 1.5–2.5)

## 2014-02-27 MED ORDER — FUROSEMIDE 10 MG/ML IJ SOLN
20.0000 mg | Freq: Once | INTRAMUSCULAR | Status: AC
  Administered 2014-02-27: 20 mg via INTRAVENOUS

## 2014-02-27 MED ORDER — ALBUTEROL SULFATE (2.5 MG/3ML) 0.083% IN NEBU: 2.5000 mg | INHALATION_SOLUTION | RESPIRATORY_TRACT | Status: DC | PRN

## 2014-02-27 MED ORDER — PANTOPRAZOLE SODIUM 40 MG PO PACK
40.0000 mg | PACK | ORAL | Status: DC
  Administered 2014-02-27: 40 mg
  Filled 2014-02-27 (×4): qty 20

## 2014-02-27 MED ORDER — FOLIC ACID 1 MG PO TABS
1.0000 mg | ORAL_TABLET | Freq: Every day | ORAL | Status: DC
  Filled 2014-02-27: qty 1

## 2014-02-27 MED ORDER — VITAMIN B-1 100 MG PO TABS
100.0000 mg | ORAL_TABLET | Freq: Every day | ORAL | Status: DC
  Administered 2014-02-27 – 2014-02-28 (×2): 100 mg
  Filled 2014-02-27 (×3): qty 1

## 2014-02-27 MED ORDER — DOCUSATE SODIUM 50 MG/5ML PO LIQD
100.0000 mg | Freq: Two times a day (BID) | ORAL | Status: DC
  Administered 2014-02-27 – 2014-03-08 (×10): 100 mg via ORAL
  Filled 2014-02-27 (×22): qty 10

## 2014-02-27 MED ORDER — POLYETHYLENE GLYCOL 3350 17 G PO PACK
17.0000 g | PACK | Freq: Three times a day (TID) | ORAL | Status: DC | PRN
  Filled 2014-02-27: qty 1

## 2014-02-27 MED ORDER — FOLIC ACID 1 MG PO TABS
1.0000 mg | ORAL_TABLET | Freq: Every day | ORAL | Status: DC
Start: 2014-02-27 — End: 2014-03-01
  Administered 2014-02-27 – 2014-02-28 (×2): 1 mg
  Filled 2014-02-27 (×3): qty 1

## 2014-02-27 MED ORDER — POTASSIUM CHLORIDE 10 MEQ/100ML IV SOLN
10.0000 meq | INTRAVENOUS | Status: AC
  Administered 2014-02-27 (×4): 10 meq via INTRAVENOUS
  Filled 2014-02-27 (×4): qty 100

## 2014-02-27 NOTE — Progress Notes (Signed)
Received prescreen request on 02-24-14 for possible inpatient rehab consult and pt was reintubated. Pt has remained on vent support. At this time, rehab consult is not appropriate. Please reach out to an admission coordinator if pt becomes appropriate and is extubated/participating with therapy.  Thank you.  Juliann MuleJanine Macguire Holsinger, PT Rehabilitation Admissions Coordinator [phone]

## 2014-02-27 NOTE — Progress Notes (Signed)
OT Cancellation Note  Patient Details Name: Katrine CohoJeffrey L Noh MRN: [id number] DOB: 04-14-1955   Cancelled Treatment:    Reason Eval/Treat Not Completed: Medical issues which prohibited therapy Pt intubated/sedated. Will check on pt Monday. [facility] SandstoneWARD,HILLARY  Ahriyah Vannest, OTR/L  [phone] 02/27/2014 02/27/2014, 12:55 PM
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
## 2014-02-27 NOTE — Progress Notes (Signed)
SLP Cancellation Note  Patient Details Name: Justin CohoJeffrey L Pugh MRN: [id number] DOB: 1955/10/04   Cancelled treatment:       Reason Eval/Treat Not Completed: Medical issues which prohibited therapy.  Pt remains intubated.  Will sign off and await new orders.   Blenda MountsCouture, Chivas Notz Laurice 02/27/2014, 10:44 AM

## 2014-02-28 ENCOUNTER — Inpatient Hospital Stay (HOSPITAL_COMMUNITY): Payer: Medicaid Other

## 2014-02-28 DIAGNOSIS — J9601 Acute respiratory failure with hypoxia: Secondary | ICD-10-CM | POA: Diagnosis present

## 2014-02-28 DIAGNOSIS — S065XAA Traumatic subdural hemorrhage with loss of consciousness status unknown, initial encounter: Secondary | ICD-10-CM | POA: Diagnosis present

## 2014-02-28 DIAGNOSIS — S065X9A Traumatic subdural hemorrhage with loss of consciousness of unspecified duration, initial encounter: Secondary | ICD-10-CM | POA: Diagnosis present

## 2014-02-28 LAB — BASIC METABOLIC PANEL
ANION GAP: 5 (ref 5–15)
BUN: 22 mg/dL (ref 6–23)
CO2: 26 mmol/L (ref 19–32)
CREATININE: 0.81 mg/dL (ref 0.50–1.35)
Calcium: 8.1 mg/dL — ABNORMAL LOW (ref 8.4–10.5)
Chloride: 109 mEq/L (ref 96–112)
GFR calc non Af Amer: >90 mL/min (ref >90)
Glucose, Bld: 158 mg/dL — ABNORMAL HIGH (ref 70–99)
POTASSIUM: 3.2 mmol/L — AB (ref 3.5–5.1)
SODIUM: 140 mmol/L (ref 135–145)

## 2014-02-28 LAB — CBC WITH DIFFERENTIAL/PLATELET
Basophils Absolute: 0 10*3/uL (ref 0.0–0.1)
Basophils Relative: 0 % (ref 0–1)
EOS ABS: 0.6 10*3/uL (ref 0.0–0.7)
EOS PCT: 4 % (ref 0–5)
HCT: 35.6 % — ABNORMAL LOW (ref 39.0–52.0)
Hemoglobin: 11.8 g/dL — ABNORMAL LOW (ref 13.0–17.0)
Lymphocytes Relative: 11 % — ABNORMAL LOW (ref 12–46)
Lymphs Abs: 1.6 10*3/uL (ref 0.7–4.0)
MCH: 32.7 pg (ref 26.0–34.0)
MCHC: 33.1 g/dL (ref 30.0–36.0)
MCV: 98.6 fL (ref 78.0–100.0)
Monocytes Absolute: 1.3 10*3/uL — ABNORMAL HIGH (ref 0.1–1.0)
Monocytes Relative: 9 % (ref 3–12)
Neutro Abs: 11.1 10*3/uL — ABNORMAL HIGH (ref 1.7–7.7)
Neutrophils Relative %: 76 % (ref 43–77)
Platelets: 229 10*3/uL (ref 150–400)
RBC: 3.61 MIL/uL — AB (ref 4.22–5.81)
RDW: 12.8 % (ref 11.5–15.5)
WBC Morphology: INCREASED
WBC: 14.6 10*3/uL — ABNORMAL HIGH (ref 4.0–10.5)

## 2014-02-28 LAB — GLUCOSE, CAPILLARY
GLUCOSE-CAPILLARY: 107 mg/dL — AB (ref 70–99)
GLUCOSE-CAPILLARY: 129 mg/dL — AB (ref 70–99)
GLUCOSE-CAPILLARY: 140 mg/dL — AB (ref 70–99)
Glucose-Capillary: 109 mg/dL — ABNORMAL HIGH (ref 70–99)
Glucose-Capillary: 135 mg/dL — ABNORMAL HIGH (ref 70–99)
Glucose-Capillary: 149 mg/dL — ABNORMAL HIGH (ref 70–99)
Glucose-Capillary: 156 mg/dL — ABNORMAL HIGH (ref 70–99)

## 2014-02-28 LAB — TRIGLYCERIDES: Triglycerides: 63 mg/dL (ref <150)

## 2014-02-28 MED ORDER — POTASSIUM CHLORIDE 10 MEQ/100ML IV SOLN
10.0000 meq | INTRAVENOUS | Status: AC
  Administered 2014-02-28 (×6): 10 meq via INTRAVENOUS
  Filled 2014-02-28 (×6): qty 100

## 2014-02-28 MED ORDER — QUETIAPINE FUMARATE 100 MG PO TABS
100.0000 mg | ORAL_TABLET | Freq: Three times a day (TID) | ORAL | Status: DC
  Administered 2014-02-28: 100 mg
  Filled 2014-02-28 (×6): qty 1

## 2014-02-28 MED ORDER — INSULIN ASPART 100 UNIT/ML ~~LOC~~ SOLN
0.0000 [IU] | SUBCUTANEOUS | Status: DC
  Administered 2014-02-28 – 2014-03-04 (×11): 1 [IU] via SUBCUTANEOUS
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
## 2014-02-28 NOTE — Progress Notes (Signed)
eLink Physician-Brief Progress Note Patient Name: Justin CohoJeffrey L Porr DOB: 1955-08-15 MRN: [id number]   Date of Service  02/28/2014  HPI/Events of Note  Hypokalemia  eICU Interventions  Potassium replaced     Intervention Category Intermediate Interventions: Electrolyte abnormality - evaluation and management  DETERDING,ELIZABETH 02/28/2014, 6:32 AM
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
## 2014-02-28 NOTE — Procedures (Signed)
Extubation Procedure Note  Patient Details:   Name: Justin CohoJeffrey L Bribiesca DOB: November 22, 1955 MRN: [id number]   Airway Documentation:  AIRWAYS 8 mm (Active)  Secured at (cm) 24 cm 02/25/2014 12:00 AM    Evaluation  O2 sats: stable throughout Complications: No apparent complications Patient did tolerate procedure well. Bilateral Breath Sounds: Clear, Diminished Suctioning: Airway Yes, pt. placed to SBT/wean @ 0912 06/25/38% (peep/ps), tolerating well with good numbers, order placed, NIF:(-40) cmh/FVC:(1.8)L, holding head off bed attempting to pull out all lines, (+) cuff leak noted with initial cuff pressure reading, RN's/staff @ bedside, Extubated without difficulty and placed to 2 lpm n/c,plan to start I/S asap, RT to monitor.  Joylene JohnSweeney, Gregery Walberg Mitchell 02/28/2014, 10:28 AM
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
## 2014-03-01 LAB — COMPREHENSIVE METABOLIC PANEL
ALBUMIN: 2.1 g/dL — AB (ref 3.5–5.2)
ALT: 38 U/L (ref 0–53)
AST: 39 U/L — AB (ref 0–37)
Alkaline Phosphatase: 97 U/L (ref 39–117)
Anion gap: 9 (ref 5–15)
BUN: 12 mg/dL (ref 6–23)
CALCIUM: 8.9 mg/dL (ref 8.4–10.5)
CHLORIDE: 105 meq/L (ref 96–112)
CO2: 26 mmol/L (ref 19–32)
Creatinine, Ser: 0.63 mg/dL (ref 0.50–1.35)
GFR calc Af Amer: >90 mL/min (ref >90)
GFR calc non Af Amer: >90 mL/min (ref >90)
Glucose, Bld: 126 mg/dL — ABNORMAL HIGH (ref 70–99)
Potassium: 3.6 mmol/L (ref 3.5–5.1)
Sodium: 140 mmol/L (ref 135–145)
Total Bilirubin: 0.9 mg/dL (ref 0.3–1.2)
Total Protein: 6.4 g/dL (ref 6.0–8.3)

## 2014-03-01 LAB — GLUCOSE, CAPILLARY
GLUCOSE-CAPILLARY: 109 mg/dL — AB (ref 70–99)
GLUCOSE-CAPILLARY: 118 mg/dL — AB (ref 70–99)
GLUCOSE-CAPILLARY: 131 mg/dL — AB (ref 70–99)
Glucose-Capillary: 125 mg/dL — ABNORMAL HIGH (ref 70–99)
Glucose-Capillary: 120 mg/dL — ABNORMAL HIGH (ref 70–99)
Glucose-Capillary: 121 mg/dL — ABNORMAL HIGH (ref 70–99)

## 2014-03-01 LAB — CBC
HEMATOCRIT: 36.7 % — AB (ref 39.0–52.0)
Hemoglobin: 12.5 g/dL — ABNORMAL LOW (ref 13.0–17.0)
MCH: 32.8 pg (ref 26.0–34.0)
MCHC: 34.1 g/dL (ref 30.0–36.0)
MCV: 96.3 fL (ref 78.0–100.0)
Platelets: 243 10*3/uL (ref 150–400)
RBC: 3.81 MIL/uL — ABNORMAL LOW (ref 4.22–5.81)
RDW: 12.5 % (ref 11.5–15.5)
WBC: 16.5 10*3/uL — AB (ref 4.0–10.5)

## 2014-03-01 MED ORDER — FENTANYL CITRATE 0.05 MG/ML IJ SOLN
25.0000 ug | INTRAMUSCULAR | Status: DC | PRN
  Administered 2014-03-01 – 2014-03-04 (×11): 25 ug via INTRAVENOUS
  Filled 2014-03-01 (×10): qty 2

## 2014-03-01 MED ORDER — LORAZEPAM 2 MG/ML IJ SOLN
0.5000 mg | INTRAMUSCULAR | Status: DC | PRN
  Administered 2014-03-01 – 2014-03-07 (×7): 0.5 mg via INTRAVENOUS
  Filled 2014-03-01 (×8): qty 1

## 2014-03-01 MED ORDER — THIAMINE HCL 100 MG/ML IJ SOLN
100.0000 mg | Freq: Every day | INTRAMUSCULAR | Status: DC
  Administered 2014-03-01 – 2014-03-04 (×4): 100 mg via INTRAVENOUS
  Filled 2014-03-01 (×5): qty 1

## 2014-03-01 MED ORDER — FOLIC ACID 5 MG/ML IJ SOLN
1.0000 mg | Freq: Every day | INTRAMUSCULAR | Status: DC
  Administered 2014-03-01 – 2014-03-04 (×4): 1 mg via INTRAVENOUS
  Filled 2014-03-01 (×5): qty 0.2

## 2014-03-01 MED ORDER — CLONIDINE HCL 0.2 MG/24HR TD PTWK
0.2000 mg | MEDICATED_PATCH | TRANSDERMAL | Status: DC
  Administered 2014-03-01 – 2014-03-08 (×2): 0.2 mg via TRANSDERMAL
  Filled 2014-03-01 (×2): qty 1
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
## 2014-03-02 ENCOUNTER — Inpatient Hospital Stay (HOSPITAL_COMMUNITY): Payer: Medicaid Other

## 2014-03-02 DIAGNOSIS — I6200 Nontraumatic subdural hemorrhage, unspecified: Secondary | ICD-10-CM

## 2014-03-02 DIAGNOSIS — S062X9S Diffuse traumatic brain injury with loss of consciousness of unspecified duration, sequela: Secondary | ICD-10-CM

## 2014-03-02 DIAGNOSIS — R410 Disorientation, unspecified: Secondary | ICD-10-CM

## 2014-03-02 DIAGNOSIS — J9601 Acute respiratory failure with hypoxia: Secondary | ICD-10-CM

## 2014-03-02 LAB — GLUCOSE, CAPILLARY
GLUCOSE-CAPILLARY: 123 mg/dL — AB (ref 70–99)
GLUCOSE-CAPILLARY: 112 mg/dL — AB (ref 70–99)
Glucose-Capillary: 118 mg/dL — ABNORMAL HIGH (ref 70–99)
Glucose-Capillary: 128 mg/dL — ABNORMAL HIGH (ref 70–99)
Glucose-Capillary: 120 mg/dL — ABNORMAL HIGH (ref 70–99)

## 2014-03-02 MED ORDER — PIVOT 1.5 CAL PO LIQD
1000.0000 mL | ORAL | Status: DC
  Filled 2014-03-02 (×2): qty 1000

## 2014-03-02 MED ORDER — PIVOT 1.5 CAL PO LIQD
1000.0000 mL | ORAL | Status: DC
  Filled 2014-03-02 (×3): qty 1000

## 2014-03-02 MED ORDER — PRO-STAT SUGAR FREE PO LIQD
30.0000 mL | Freq: Every day | ORAL | Status: DC
  Administered 2014-03-05: 30 mL
  Filled 2014-03-02 (×4): qty 30
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
## 2014-03-02 NOTE — Progress Notes (Signed)
[facility] and 5-point restraints. Unsuccessful both times. Dr. Andrey CampanileWilson called and notified. No new orders

## 2014-03-02 NOTE — Progress Notes (Signed)
03/02/14 at 16:20:  Panda feeding tube placed to 70cm via right nare.  Placement confirmed by auscultation & CO2 detector; xray pending. 17:09: Xray confirms tube in fundus. 17:55:  Tube removed by patient despite bilateral mittens, 4-point restraints, posey belt and Precedex drip at 1.8 mcg/kg/min.
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
## 2014-03-03 LAB — CBC
HCT: 37.8 % — ABNORMAL LOW (ref 39.0–52.0)
HEMOGLOBIN: 13.3 g/dL (ref 13.0–17.0)
MCH: 33.4 pg (ref 26.0–34.0)
MCHC: 35.2 g/dL (ref 30.0–36.0)
MCV: 95 fL (ref 78.0–100.0)
Platelets: 283 10*3/uL (ref 150–400)
RBC: 3.98 MIL/uL — AB (ref 4.22–5.81)
RDW: 12.3 % (ref 11.5–15.5)
WBC: 16.2 10*3/uL — AB (ref 4.0–10.5)

## 2014-03-03 LAB — BASIC METABOLIC PANEL
ANION GAP: 4 — AB (ref 5–15)
BUN: 8 mg/dL (ref 6–23)
CALCIUM: 8.2 mg/dL — AB (ref 8.4–10.5)
CHLORIDE: 106 meq/L (ref 96–112)
CO2: 25 mmol/L (ref 19–32)
Creatinine, Ser: 0.57 mg/dL (ref 0.50–1.35)
GFR calc Af Amer: >90 mL/min (ref >90)
GFR calc non Af Amer: >90 mL/min (ref >90)
GLUCOSE: 134 mg/dL — AB (ref 70–99)
Potassium: 3.3 mmol/L — ABNORMAL LOW (ref 3.5–5.1)
SODIUM: 135 mmol/L (ref 135–145)

## 2014-03-03 LAB — GLUCOSE, CAPILLARY
GLUCOSE-CAPILLARY: 116 mg/dL — AB (ref 70–99)
GLUCOSE-CAPILLARY: 116 mg/dL — AB (ref 70–99)
GLUCOSE-CAPILLARY: 121 mg/dL — AB (ref 70–99)
GLUCOSE-CAPILLARY: 145 mg/dL — AB (ref 70–99)
Glucose-Capillary: 123 mg/dL — ABNORMAL HIGH (ref 70–99)
Glucose-Capillary: 105 mg/dL — ABNORMAL HIGH (ref 70–99)

## 2014-03-03 MED ORDER — ENOXAPARIN SODIUM 40 MG/0.4ML ~~LOC~~ SOLN
40.0000 mg | SUBCUTANEOUS | Status: DC
  Administered 2014-03-03 – 2014-03-09 (×7): 40 mg via SUBCUTANEOUS
  Filled 2014-03-03 (×7): qty 0.4
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
## 2014-03-04 ENCOUNTER — Inpatient Hospital Stay (HOSPITAL_COMMUNITY): Payer: Medicaid Other

## 2014-03-04 DIAGNOSIS — J189 Pneumonia, unspecified organism: Secondary | ICD-10-CM | POA: Diagnosis not present

## 2014-03-04 DIAGNOSIS — W19XXXA Unspecified fall, initial encounter: Secondary | ICD-10-CM | POA: Diagnosis present

## 2014-03-04 DIAGNOSIS — S0292XA Unspecified fracture of facial bones, initial encounter for closed fracture: Secondary | ICD-10-CM | POA: Diagnosis present

## 2014-03-04 LAB — BASIC METABOLIC PANEL
Anion gap: 8 (ref 5–15)
BUN: 7 mg/dL (ref 6–23)
CO2: 22 mmol/L (ref 19–32)
Calcium: 8.2 mg/dL — ABNORMAL LOW (ref 8.4–10.5)
Chloride: 106 mEq/L (ref 96–112)
Creatinine, Ser: 0.64 mg/dL (ref 0.50–1.35)
GFR calc non Af Amer: >90 mL/min (ref >90)
Glucose, Bld: 119 mg/dL — ABNORMAL HIGH (ref 70–99)
POTASSIUM: 3.1 mmol/L — AB (ref 3.5–5.1)
Sodium: 136 mmol/L (ref 135–145)

## 2014-03-04 LAB — CULTURE, RESPIRATORY: GRAM STAIN: NONE SEEN

## 2014-03-04 LAB — CULTURE, RESPIRATORY W GRAM STAIN: Special Requests: NORMAL

## 2014-03-04 LAB — GLUCOSE, CAPILLARY
GLUCOSE-CAPILLARY: 145 mg/dL — AB (ref 70–99)
GLUCOSE-CAPILLARY: 118 mg/dL — AB (ref 70–99)
Glucose-Capillary: 116 mg/dL — ABNORMAL HIGH (ref 70–99)
Glucose-Capillary: 128 mg/dL — ABNORMAL HIGH (ref 70–99)

## 2014-03-04 MED ORDER — MORPHINE SULFATE 2 MG/ML IJ SOLN
2.0000 mg | INTRAMUSCULAR | Status: DC | PRN
  Administered 2014-03-05 – 2014-03-09 (×5): 2 mg via INTRAVENOUS
  Filled 2014-03-04 (×5): qty 1

## 2014-03-04 MED ORDER — TRAMADOL HCL 50 MG PO TABS
50.0000 mg | ORAL_TABLET | Freq: Four times a day (QID) | ORAL | Status: DC | PRN
  Administered 2014-03-05: 100 mg via ORAL
  Administered 2014-03-06: 75 mg via ORAL
  Filled 2014-03-04 (×2): qty 2

## 2014-03-04 MED ORDER — QUETIAPINE FUMARATE 100 MG PO TABS
100.0000 mg | ORAL_TABLET | Freq: Three times a day (TID) | ORAL | Status: DC
  Administered 2014-03-04 – 2014-03-05 (×4): 100 mg via ORAL
  Filled 2014-03-04 (×9): qty 1

## 2014-03-04 MED ORDER — CLONAZEPAM 1 MG PO TABS
1.0000 mg | ORAL_TABLET | Freq: Three times a day (TID) | ORAL | Status: DC
  Administered 2014-03-04 – 2014-03-06 (×6): 1 mg via ORAL
  Filled 2014-03-04 (×6): qty 1

## 2014-03-04 MED ORDER — STARCH (THICKENING) PO POWD
ORAL | Status: DC | PRN
  Filled 2014-03-04: qty 227

## 2014-03-04 MED ORDER — POLYETHYLENE GLYCOL 3350 17 G PO PACK
17.0000 g | PACK | Freq: Every day | ORAL | Status: DC
  Administered 2014-03-04 – 2014-03-07 (×4): 17 g via ORAL
  Filled 2014-03-04 (×5): qty 1
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
## 2014-03-04 NOTE — Progress Notes (Signed)
Patient pullled PICC out 15 cm from a position of the 0 mark. Primary RN to request CXR to confirm tip and then address in AM regarding replacement or removal. 2nd IV started for IV meds.
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
## 2014-03-05 LAB — BASIC METABOLIC PANEL
ANION GAP: 13 (ref 5–15)
BUN: 6 mg/dL (ref 6–23)
CHLORIDE: 106 meq/L (ref 96–112)
CO2: 25 mmol/L (ref 19–32)
Calcium: 9.2 mg/dL (ref 8.4–10.5)
Creatinine, Ser: 0.77 mg/dL (ref 0.50–1.35)
GFR calc Af Amer: >90 mL/min (ref >90)
GFR calc non Af Amer: >90 mL/min (ref >90)
Glucose, Bld: 122 mg/dL — ABNORMAL HIGH (ref 70–99)
Potassium: 3.6 mmol/L (ref 3.5–5.1)
Sodium: 144 mmol/L (ref 135–145)

## 2014-03-05 MED ORDER — VITAMIN B-1 100 MG PO TABS
100.0000 mg | ORAL_TABLET | Freq: Every day | ORAL | Status: DC
  Administered 2014-03-05 – 2014-03-09 (×5): 100 mg via ORAL
  Filled 2014-03-05 (×5): qty 1

## 2014-03-05 MED ORDER — FOLIC ACID 1 MG PO TABS
1.0000 mg | ORAL_TABLET | Freq: Every day | ORAL | Status: DC
  Administered 2014-03-05 – 2014-03-09 (×5): 1 mg via ORAL
  Filled 2014-03-05 (×5): qty 1

## 2014-03-05 MED ORDER — ENSURE COMPLETE PO LIQD
237.0000 mL | Freq: Two times a day (BID) | ORAL | Status: DC
  Administered 2014-03-05 – 2014-03-09 (×6): 237 mL via ORAL

## 2014-03-05 MED ORDER — CETYLPYRIDINIUM CHLORIDE 0.05 % MT LIQD
7.0000 mL | Freq: Two times a day (BID) | OROMUCOSAL | Status: DC
  Administered 2014-03-05 – 2014-03-08 (×8): 7 mL via OROMUCOSAL

## 2014-03-05 MED ORDER — QUETIAPINE FUMARATE 200 MG PO TABS
200.0000 mg | ORAL_TABLET | Freq: Three times a day (TID) | ORAL | Status: DC
  Administered 2014-03-05 – 2014-03-09 (×13): 200 mg via ORAL
  Filled 2014-03-05 (×2): qty 1
  Filled 2014-03-05: qty 4
  Filled 2014-03-05 (×3): qty 1
  Filled 2014-03-05 (×2): qty 4
  Filled 2014-03-05 (×2): qty 1
  Filled 2014-03-05: qty 4
  Filled 2014-03-05: qty 1
  Filled 2014-03-05 (×2): qty 4

## 2014-03-05 NOTE — Progress Notes (Signed)
Mom Mirna Mires(Mary Brock) took belongings home  Contents include [place]21.65 [place] and [employer] card [employer]
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
## 2014-03-05 NOTE — Progress Notes (Signed)
Rehab admissions - Evaluated for possible inpatient rehab admission.  Patient not medically ready yet for acute inpatient rehab admission.  I would like for him to be ranchos IV.  I will speak with family about inpatient rehab.  Call me for questions.  #[phone]
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
## 2014-03-05 NOTE — Clinical Social Work Note (Signed)
Clinical Social Worker continuing to follow patient and family for support and discharge planning needs.  Patient is currently a Ranchos III and per inpatient rehab, needs to be a Ranchos IV prior to admit.  CSW has completed FL2 and initiated bed search within a [place] radius [place] for a letter of guarantee facility.  CSW to follow up with patient mother, facilities, and inpatient rehab to determine most appropriate venue of care for patient at discharge.  CSW remains available for support and to facilitate patient discharge needs once medically stable and bed available.  Macario GoldsJesse Sopheap Boehle, [place]CS[place] [phone]
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
## 2014-03-06 MED ORDER — RESOURCE THICKENUP CLEAR PO POWD
ORAL | Status: DC | PRN
  Filled 2014-03-06 (×2): qty 125

## 2014-03-06 MED ORDER — CLONAZEPAM 0.5 MG PO TABS
0.5000 mg | ORAL_TABLET | Freq: Three times a day (TID) | ORAL | Status: DC
  Administered 2014-03-06 – 2014-03-09 (×8): 0.5 mg via ORAL
  Filled 2014-03-06 (×10): qty 1
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
## 2014-03-06 NOTE — Progress Notes (Signed)
UR completed.  Ceylin Dreibelbis, RN BSN MHA CCM Trauma/Neuro ICU Case Manager [phone]  
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
## 2014-03-06 NOTE — Progress Notes (Signed)
Rehab admissions - i spoke with Dr. Riley KillSwartz about patient.  We are not able to offer him a bed today on inpatient rehab, but we will look at patient again on Monday and see if he is ready for our inpatient rehab program.  Call me for questions.  #[phone]
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
## 2014-03-08 LAB — CBC
HCT: 45 % (ref 39.0–52.0)
HEMOGLOBIN: 15.5 g/dL (ref 13.0–17.0)
MCH: 33.6 pg (ref 26.0–34.0)
MCHC: 34.4 g/dL (ref 30.0–36.0)
MCV: 97.6 fL (ref 78.0–100.0)
Platelets: 420 10*3/uL — ABNORMAL HIGH (ref 150–400)
RBC: 4.61 MIL/uL (ref 4.22–5.81)
RDW: 12.4 % (ref 11.5–15.5)
WBC: 14.8 10*3/uL — ABNORMAL HIGH (ref 4.0–10.5)

## 2014-03-08 MED ORDER — HALOPERIDOL LACTATE 5 MG/ML IJ SOLN: 2.0000 mg | Freq: Four times a day (QID) | INTRAMUSCULAR | Status: DC | PRN
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
## 2014-03-09 ENCOUNTER — Encounter (HOSPITAL_COMMUNITY): Payer: Self-pay | Admitting: *Deleted

## 2014-03-09 ENCOUNTER — Inpatient Hospital Stay (HOSPITAL_COMMUNITY)
Admission: AD | Admit: 2014-03-09 | Discharge: 2014-05-22 | DRG: 560 | Disposition: A | Discharge status: Skilled Nursing Facility | Payer: Medicaid Other | Source: Intra-hospital | Attending: Physical Medicine & Rehabilitation | Admitting: Physical Medicine & Rehabilitation

## 2014-03-09 DIAGNOSIS — R1314 Dysphagia, pharyngoesophageal phase: Secondary | ICD-10-CM | POA: Diagnosis not present

## 2014-03-09 DIAGNOSIS — S062X4D Diffuse traumatic brain injury with loss of consciousness of 6 hours to 24 hours, subsequent encounter: Secondary | ICD-10-CM | POA: Diagnosis not present

## 2014-03-09 DIAGNOSIS — Z590 Homelessness: Secondary | ICD-10-CM

## 2014-03-09 DIAGNOSIS — K219 Gastro-esophageal reflux disease without esophagitis: Secondary | ICD-10-CM | POA: Diagnosis not present

## 2014-03-09 DIAGNOSIS — F10239 Alcohol dependence with withdrawal, unspecified: Secondary | ICD-10-CM

## 2014-03-09 DIAGNOSIS — R1312 Dysphagia, oropharyngeal phase: Secondary | ICD-10-CM

## 2014-03-09 DIAGNOSIS — S069X3A Unspecified intracranial injury with loss of consciousness of 1 hour to 5 hours 59 minutes, initial encounter: Secondary | ICD-10-CM

## 2014-03-09 DIAGNOSIS — R05 Cough: Secondary | ICD-10-CM

## 2014-03-09 DIAGNOSIS — J189 Pneumonia, unspecified organism: Secondary | ICD-10-CM

## 2014-03-09 DIAGNOSIS — S062X4A Diffuse traumatic brain injury with loss of consciousness of 6 hours to 24 hours, initial encounter: Secondary | ICD-10-CM | POA: Diagnosis present

## 2014-03-09 DIAGNOSIS — S062X4S Diffuse traumatic brain injury with loss of consciousness of 6 hours to 24 hours, sequela: Secondary | ICD-10-CM | POA: Diagnosis not present

## 2014-03-09 DIAGNOSIS — S065X2S Traumatic subdural hemorrhage with loss of consciousness of 31 minutes to 59 minutes, sequela: Secondary | ICD-10-CM | POA: Diagnosis not present

## 2014-03-09 DIAGNOSIS — R1311 Dysphagia, oral phase: Secondary | ICD-10-CM

## 2014-03-09 DIAGNOSIS — S069X4S Unspecified intracranial injury with loss of consciousness of 6 hours to 24 hours, sequela: Secondary | ICD-10-CM | POA: Diagnosis not present

## 2014-03-09 DIAGNOSIS — R451 Restlessness and agitation: Secondary | ICD-10-CM | POA: Diagnosis not present

## 2014-03-09 DIAGNOSIS — Z931 Gastrostomy status: Secondary | ICD-10-CM

## 2014-03-09 DIAGNOSIS — J690 Pneumonitis due to inhalation of food and vomit: Secondary | ICD-10-CM | POA: Diagnosis not present

## 2014-03-09 DIAGNOSIS — S065X4S Traumatic subdural hemorrhage with loss of consciousness of 6 hours to 24 hours, sequela: Secondary | ICD-10-CM | POA: Diagnosis not present

## 2014-03-09 DIAGNOSIS — R1310 Dysphagia, unspecified: Secondary | ICD-10-CM

## 2014-03-09 DIAGNOSIS — S0219XD Other fracture of base of skull, subsequent encounter for fracture with routine healing: Principal | ICD-10-CM

## 2014-03-09 DIAGNOSIS — F10230 Alcohol dependence with withdrawal, uncomplicated: Secondary | ICD-10-CM | POA: Diagnosis present

## 2014-03-09 DIAGNOSIS — R059 Cough, unspecified: Secondary | ICD-10-CM

## 2014-03-09 DIAGNOSIS — S065X3S Traumatic subdural hemorrhage with loss of consciousness of 1 hour to 5 hours 59 minutes, sequela: Secondary | ICD-10-CM | POA: Diagnosis not present

## 2014-03-09 DIAGNOSIS — S065X0D Traumatic subdural hemorrhage without loss of consciousness, subsequent encounter: Secondary | ICD-10-CM | POA: Diagnosis not present

## 2014-03-09 DIAGNOSIS — S028XXD Fractures of other specified skull and facial bones, subsequent encounter for fracture with routine healing: Secondary | ICD-10-CM

## 2014-03-09 DIAGNOSIS — S06370D Contusion, laceration, and hemorrhage of cerebellum without loss of consciousness, subsequent encounter: Secondary | ICD-10-CM

## 2014-03-09 DIAGNOSIS — E876 Hypokalemia: Secondary | ICD-10-CM

## 2014-03-09 DIAGNOSIS — Z09 Encounter for follow-up examination after completed treatment for conditions other than malignant neoplasm: Secondary | ICD-10-CM

## 2014-03-09 DIAGNOSIS — S065X0S Traumatic subdural hemorrhage without loss of consciousness, sequela: Secondary | ICD-10-CM | POA: Diagnosis not present

## 2014-03-09 DIAGNOSIS — S0210XD Unspecified fracture of base of skull, subsequent encounter for fracture with routine healing: Secondary | ICD-10-CM | POA: Diagnosis present

## 2014-03-09 DIAGNOSIS — IMO0001 Reserved for inherently not codable concepts without codable children: Secondary | ICD-10-CM

## 2014-03-09 MED ORDER — SODIUM CHLORIDE 0.9 % IJ SOLN: 10.0000 mL | INTRAMUSCULAR | Status: DC | PRN

## 2014-03-09 MED ORDER — OXYCODONE HCL 5 MG PO TABS
5.0000 mg | ORAL_TABLET | ORAL | Status: DC | PRN
  Administered 2014-03-10 – 2014-03-27 (×15): 10 mg via ORAL
  Administered 2014-03-29: 5 mg via ORAL
  Administered 2014-03-30 – 2014-04-06 (×6): 10 mg via ORAL
  Filled 2014-03-09 (×9): qty 2
  Filled 2014-03-09: qty 1
  Filled 2014-03-09 (×8): qty 2
  Filled 2014-03-09: qty 1
  Filled 2014-03-09 (×8): qty 2

## 2014-03-09 MED ORDER — ALUM & MAG HYDROXIDE-SIMETH 200-200-20 MG/5ML PO SUSP
30.0000 mL | ORAL | Status: DC | PRN
  Administered 2014-03-11 – 2014-03-26 (×3): 30 mL via ORAL
  Filled 2014-03-09 (×3): qty 30

## 2014-03-09 MED ORDER — CLONIDINE HCL 0.2 MG/24HR TD PTWK
0.2000 mg | MEDICATED_PATCH | TRANSDERMAL | Status: DC
  Administered 2014-03-15 – 2014-04-26 (×7): 0.2 mg via TRANSDERMAL
  Filled 2014-03-09 (×8): qty 1

## 2014-03-09 MED ORDER — GUAIFENESIN-DM 100-10 MG/5ML PO SYRP: 5.0000 mL | ORAL_SOLUTION | Freq: Four times a day (QID) | ORAL | Status: DC | PRN

## 2014-03-09 MED ORDER — QUETIAPINE FUMARATE 200 MG PO TABS
200.0000 mg | ORAL_TABLET | Freq: Two times a day (BID) | ORAL | Status: DC
  Administered 2014-03-09 – 2014-03-10 (×3): 200 mg via ORAL
  Filled 2014-03-09 (×6): qty 1

## 2014-03-09 MED ORDER — ALBUTEROL SULFATE (2.5 MG/3ML) 0.083% IN NEBU: 2.5000 mg | INHALATION_SOLUTION | RESPIRATORY_TRACT | Status: DC | PRN

## 2014-03-09 MED ORDER — SENNOSIDES-DOCUSATE SODIUM 8.6-50 MG PO TABS
1.0000 | ORAL_TABLET | Freq: Every day | ORAL | Status: DC
  Administered 2014-03-09 – 2014-03-10 (×2): 1 via ORAL
  Filled 2014-03-09 (×2): qty 1

## 2014-03-09 MED ORDER — ACETAMINOPHEN 325 MG PO TABS
325.0000 mg | ORAL_TABLET | ORAL | Status: DC | PRN
  Filled 2014-03-09 (×2): qty 2

## 2014-03-09 MED ORDER — TRAZODONE HCL 50 MG PO TABS
25.0000 mg | ORAL_TABLET | Freq: Every evening | ORAL | Status: DC | PRN
  Administered 2014-03-18 – 2014-03-29 (×4): 50 mg via ORAL
  Filled 2014-03-09 (×5): qty 1

## 2014-03-09 MED ORDER — CHLORDIAZEPOXIDE HCL 25 MG PO CAPS
25.0000 mg | ORAL_CAPSULE | Freq: Three times a day (TID) | ORAL | Status: DC | PRN
  Administered 2014-03-09 – 2014-03-19 (×7): 25 mg via ORAL
  Filled 2014-03-09 (×10): qty 1

## 2014-03-09 MED ORDER — VITAMIN B-1 100 MG PO TABS
100.0000 mg | ORAL_TABLET | Freq: Every day | ORAL | Status: DC
Start: 2014-03-10 — End: 2014-03-30
  Administered 2014-03-10 – 2014-03-30 (×21): 100 mg via ORAL
  Filled 2014-03-09 (×22): qty 1

## 2014-03-09 MED ORDER — ENOXAPARIN SODIUM 40 MG/0.4ML ~~LOC~~ SOLN
40.0000 mg | SUBCUTANEOUS | Status: DC
  Administered 2014-03-10 – 2014-04-28 (×44): 40 mg via SUBCUTANEOUS
  Filled 2014-03-09 (×52): qty 0.4

## 2014-03-09 MED ORDER — STARCH (THICKENING) PO POWD
ORAL | Status: DC | PRN
  Filled 2014-03-09 (×3): qty 227

## 2014-03-09 MED ORDER — DIPHENHYDRAMINE HCL 12.5 MG/5ML PO ELIX
12.5000 mg | ORAL_SOLUTION | Freq: Four times a day (QID) | ORAL | Status: DC | PRN
  Administered 2014-04-12: 25 mg via ORAL
  Filled 2014-03-09: qty 10

## 2014-03-09 MED ORDER — METHYLPHENIDATE HCL 5 MG PO TABS
5.0000 mg | ORAL_TABLET | Freq: Two times a day (BID) | ORAL | Status: DC
  Administered 2014-03-10 – 2014-03-15 (×11): 5 mg via ORAL
  Filled 2014-03-09 (×11): qty 1

## 2014-03-09 MED ORDER — BISACODYL 10 MG RE SUPP
10.0000 mg | Freq: Every day | RECTAL | Status: DC | PRN
  Administered 2014-03-15: 10 mg via RECTAL
  Filled 2014-03-09 (×2): qty 1

## 2014-03-09 MED ORDER — QUETIAPINE FUMARATE 200 MG PO TABS
200.0000 mg | ORAL_TABLET | Freq: Three times a day (TID) | ORAL | Status: DC
  Filled 2014-03-09 (×2): qty 1

## 2014-03-09 MED ORDER — PROCHLORPERAZINE MALEATE 5 MG PO TABS
5.0000 mg | ORAL_TABLET | Freq: Four times a day (QID) | ORAL | Status: DC | PRN
  Filled 2014-03-09: qty 2

## 2014-03-09 MED ORDER — FLEET ENEMA 7-19 GM/118ML RE ENEM: 1.0000 | ENEMA | Freq: Once | RECTAL | Status: AC | PRN

## 2014-03-09 MED ORDER — PROCHLORPERAZINE EDISYLATE 5 MG/ML IJ SOLN: 5.0000 mg | Freq: Four times a day (QID) | INTRAMUSCULAR | Status: DC | PRN

## 2014-03-09 MED ORDER — PROCHLORPERAZINE 25 MG RE SUPP: 12.5000 mg | Freq: Four times a day (QID) | RECTAL | Status: DC | PRN

## 2014-03-09 MED ORDER — RESOURCE THICKENUP CLEAR PO POWD
ORAL | Status: DC | PRN
  Filled 2014-03-09 (×2): qty 125

## 2014-03-09 MED ORDER — ENSURE PUDDING PO PUDG
1.0000 | Freq: Three times a day (TID) | ORAL | Status: DC
  Administered 2014-03-09 – 2014-03-23 (×32): 1 via ORAL

## 2014-03-09 MED ORDER — TRAMADOL HCL 50 MG PO TABS: 50.0000 mg | ORAL_TABLET | Freq: Four times a day (QID) | ORAL | Status: DC | PRN

## 2014-03-09 MED ORDER — FOLIC ACID 1 MG PO TABS
1.0000 mg | ORAL_TABLET | Freq: Every day | ORAL | Status: DC
  Administered 2014-03-10 – 2014-04-07 (×27): 1 mg via ORAL
  Filled 2014-03-09 (×30): qty 1

## 2014-03-09 MED ORDER — AMANTADINE HCL 100 MG PO CAPS
100.0000 mg | ORAL_CAPSULE | Freq: Two times a day (BID) | ORAL | Status: DC
  Filled 2014-03-09 (×2): qty 1

## 2014-03-09 MED ORDER — CLONAZEPAM 0.5 MG PO TABS
0.5000 mg | ORAL_TABLET | Freq: Three times a day (TID) | ORAL | Status: DC
  Administered 2014-03-09 – 2014-03-19 (×29): 0.5 mg via ORAL
  Filled 2014-03-09 (×29): qty 1
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
## 2014-03-09 NOTE — Progress Notes (Signed)
Discharge orders received. Pt for discharge to inpatient rehab. Soft wrist restraints and 1-to-1 sitter maintained, per MD order. Pt given discharge instructions with verbalized understanding. Staff transferred pt to 4W16 via bed.
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
## 2014-03-09 NOTE — Clinical Social Work Note (Signed)
Clinical Social Worker continuing to follow patient and family for support and discharge planning needs.  No family/friends currently at bedside.  Patient has been accepted to inpatient rehab for admission today.  Per chart, inpatient rehab admissions coordinator has attempted to contact patient family and awaiting return call to update of patient discharge plans.  CSW to update inpatient rehab CSW in the event that patient would require placement following inpatient rehab stay.    SBIRT not completed prior to discharge due to patient continued altered mental status and confusion.  Clinical Social Worker will sign off for now as social work intervention is no longer needed. Please consult us again if new need arises.  Macario GoldsJesse Tanaka Gillen, Kentucky[place]CSW [phone]
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
## 2014-03-09 NOTE — Progress Notes (Signed)
Rehab admissions - I have trauma clearance and rehab MD approval for acute inpatient rehab admission for today.  I called and left a message with mother, other contacts did not have voice mail and did not answer their phones.  Has a sitter at the bedside and mitts for safety.  Bed available and will admit to acute inpatient rehab today.  Call me for questions.  #[phone]
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
## 2014-03-10 ENCOUNTER — Inpatient Hospital Stay (HOSPITAL_COMMUNITY): Payer: Self-pay

## 2014-03-10 ENCOUNTER — Inpatient Hospital Stay (HOSPITAL_COMMUNITY): Payer: Self-pay | Admitting: Occupational Therapy

## 2014-03-10 ENCOUNTER — Inpatient Hospital Stay (HOSPITAL_COMMUNITY): Payer: Self-pay | Admitting: *Deleted

## 2014-03-10 ENCOUNTER — Inpatient Hospital Stay (HOSPITAL_COMMUNITY): Payer: Medicaid Other | Admitting: *Deleted

## 2014-03-10 ENCOUNTER — Inpatient Hospital Stay (HOSPITAL_COMMUNITY): Payer: Medicaid Other

## 2014-03-10 DIAGNOSIS — S069X4S Unspecified intracranial injury with loss of consciousness of 6 hours to 24 hours, sequela: Secondary | ICD-10-CM

## 2014-03-10 LAB — CBC WITH DIFFERENTIAL/PLATELET
BASOS ABS: 0.1 10*3/uL (ref 0.0–0.1)
BASOS PCT: 1 % (ref 0–1)
Eosinophils Absolute: 0.5 10*3/uL (ref 0.0–0.7)
Eosinophils Relative: 4 % (ref 0–5)
HEMATOCRIT: 42.8 % (ref 39.0–52.0)
Hemoglobin: 14.3 g/dL (ref 13.0–17.0)
LYMPHS PCT: 19 % (ref 12–46)
Lymphs Abs: 2.2 10*3/uL (ref 0.7–4.0)
MCH: 33.6 pg (ref 26.0–34.0)
MCHC: 33.4 g/dL (ref 30.0–36.0)
MCV: 100.5 fL — ABNORMAL HIGH (ref 78.0–100.0)
Monocytes Absolute: 1.4 10*3/uL — ABNORMAL HIGH (ref 0.1–1.0)
Monocytes Relative: 12 % (ref 3–12)
Neutro Abs: 7.4 10*3/uL (ref 1.7–7.7)
Neutrophils Relative %: 64 % (ref 43–77)
PLATELETS: 375 10*3/uL (ref 150–400)
RBC: 4.26 MIL/uL (ref 4.22–5.81)
RDW: 12.4 % (ref 11.5–15.5)
WBC: 11.5 10*3/uL — ABNORMAL HIGH (ref 4.0–10.5)

## 2014-03-10 LAB — COMPREHENSIVE METABOLIC PANEL
ALBUMIN: 2.8 g/dL — AB (ref 3.5–5.2)
ALT: 15 U/L (ref 0–53)
AST: 26 U/L (ref 0–37)
Alkaline Phosphatase: 91 U/L (ref 39–117)
Anion gap: 11 (ref 5–15)
BUN: 15 mg/dL (ref 6–23)
CALCIUM: 9.9 mg/dL (ref 8.4–10.5)
CHLORIDE: 104 meq/L (ref 96–112)
CO2: 27 mmol/L (ref 19–32)
CREATININE: 0.92 mg/dL (ref 0.50–1.35)
Glucose, Bld: 104 mg/dL — ABNORMAL HIGH (ref 70–99)
Potassium: 4.1 mmol/L (ref 3.5–5.1)
SODIUM: 142 mmol/L (ref 135–145)
Total Bilirubin: 0.7 mg/dL (ref 0.3–1.2)
Total Protein: 7.7 g/dL (ref 6.0–8.3)
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
## 2014-03-10 NOTE — Progress Notes (Signed)
Physical Therapy Session Note  Patient Details  Name: Justin CohoJeffrey L Dovel MRN: [id number] Date of Birth: Oct 03, 1955  Today's Date: 03/10/2014 PT Individual Time: 1300-1317 PT Individual Time Calculation (min): 17 min   Short Term Goals: Week 1:  PT Short Term Goal 1 (Week 1): Patient will perform bed mobility and functional transfers with supervision. PT Short Term Goal 2 (Week 1): Patient will perform functional ambulation >150' with consistent supervision. PT Short Term Goal 3 (Week 1): Patient will negotiate flight of stairs with one handrail and supervision. PT Short Term Goal 4 (Week 1): Patient will sustain attention to therapeutic activity x30" with mod cues. PT Short Term Goal 5 (Week 1): Patient will demonstrate intellectual awareness in controlled environment with min cues.  Skilled Therapeutic Interventions/Progress Updates:  Pt received in bed asleep.  Fairly easy to arouse by removing covers.  Set up patient's lunch at bedside and encouraged pt to sit EOB or in chair to eat lunch.  Pt remained in sidelying in bed but did initiate taking a few bites of his lunch and opening his container of peaches to eat while propped up on elbow.  In between bite pt began to grimace.  When asked about pain pt reported his back and leg were hurting.  Pt refused needing pain medication but did state "I think I just need to lay down, I'll catch you later".  Pt placed self in prone and returned to sleeping.  Pt to rest until next PT session at 1330.   Therapy Documentation Precautions:  Precautions Precautions: Fall Restrictions Weight Bearing Restrictions: No General: Chart Reviewed: Yes PT Amount of Missed Time (min): 13 Minutes PT Missed Treatment Reason: Patient fatigue Family/Caregiver Present: No Pain: Pain Assessment Pain Assessment: No/denies pain Pain Score: 0-No pain Faces Pain Scale: Hurts even more Pain Type: Acute pain Pain Location: Back Pain Descriptors / Indicators:  Grimacing Pain Onset: Unable to tell Pain Intervention(s): Rest  See FIM for current functional status  Therapy/Group: Individual Therapy  Edman CircleHall, Devine Dant [facility] 03/10/2014, 1:24 PM
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
## 2014-03-10 NOTE — Progress Notes (Signed)
Physical Therapy Session Note  Patient Details  Name: Justin Pugh MRN: [id number] Date of Birth: March 10, 1955  Today's Date: 03/10/2014 PT Individual Time: [id number] PT Individual Time Calculation (min): 42 min   Short Term Goals: Week 1:  PT Short Term Goal 1 (Week 1): Patient will perform bed mobility and functional transfers with supervision. PT Short Term Goal 2 (Week 1): Patient will perform functional ambulation >150' with consistent supervision. PT Short Term Goal 3 (Week 1): Patient will negotiate flight of stairs with one handrail and supervision. PT Short Term Goal 4 (Week 1): Patient will sustain attention to therapeutic activity x30" with mod cues. PT Short Term Goal 5 (Week 1): Patient will demonstrate intellectual awareness in controlled environment with min cues.  Skilled Therapeutic Interventions/Progress Updates:  Patient received supine in bed, asleep, but easily awakened. Patient requires mod coaxing, but able to initiate OOB activity. Session focused on functional mobility, cognitive remediation with emphasis on orientation, and sustained attention. Patient ambulating in/out of bathroom several times in room, combing hair at mirror. Patient continues to be restless, switching attention from one activity to another. Patient oriented to person and when asked where he was stated, "[facility]." Patient able to participate in letter and color identification task with min cues for sustained attention and 100% accuracy. Attempted to engage patient in color patterning, but patient unable to sustain attention long enough to perform task.   Patient engaged in [place] activity with B UE/LE at Level 4 x4' to facilitate improved sustained attention to task. Patient able to sustain attention for 20-25" with mod cues before stopping activity, min cues to restart activity. Patient "wandering" around during functional ambulation, picking up various items to carry with him, unable to  be redirected. Patient returned to room and sitting EOB to consume one sip of orange juice and one bite of mashed potatoes before stating he "needs to lay down." Patient left supine in enclosure bed, RN aware.  Therapy Documentation Precautions:  Precautions Precautions: Fall Restrictions Weight Bearing Restrictions: No General: Chart Reviewed: Yes PT Amount of Missed Time (min): 18 Minutes PT Missed Treatment Reason: Patient fatigue Family/Caregiver Present: No Pain: Pain Assessment Pain Assessment: No/denies pain Pain Score: 0-No pain  See FIM for current functional status  Therapy/Group: Individual Therapy  Chipper HerbBridget S Anadalay Macdonell S. Quentin Strebel, PT, DPT 03/10/2014, 2:17 PM

## 2014-03-10 NOTE — Progress Notes (Signed)
Patient information reviewed and entered into eRehab system by Laddie Naeem, RN, CRRN, PPS Coordinator.  Information including medical coding and functional independence measure will be reviewed and updated through discharge.    

## 2014-03-10 NOTE — Progress Notes (Signed)
[facility] PHYSICAL MEDICINE & REHABILITATION     PROGRESS NOTE    Subjective/Complaints: Sitting up in bed with restraints. Sitter says he slept until about 3 am. Up intermittently afterwards.  Objective: Vital Signs: Blood pressure 115/72, pulse 87, temperature 98.9 F (37.2 C), temperature source Oral, resp. rate 19, weight 85.276 kg (188 lb), SpO2 96 %. No results found.  Recent Labs  03/08/14 0553 03/10/14 0634  WBC 14.8* 11.5*  HGB 15.5 14.3  HCT 45.0 42.8  PLT 420* 375    Recent Labs  03/10/14 0634  NA 142  K 4.1  CL 104  GLUCOSE 104*  BUN 15  CREATININE 0.92  CALCIUM 9.9   CBG (last 3)  No results for input(s): GLUCAP in the last 72 hours.  Wt Readings from Last 3 Encounters:  03/10/14 85.276 kg (188 lb)  03/09/14 84.188 kg (185 lb 9.6 oz)  01/23/14 95.255 kg (210 lb)    Physical Exam:  Constitutional: He appears well-developed and well-nourished. He is sleeping. He is easily aroused. He is sedated.  In four points restraints.  HENT:  Head: Normocephalic.  Neck:  No masses.  Cardiovascular: Normal rate and regular rhythm.  Respiratory: Effort normal. No respiratory distress.  GI: Soft. Bowel sounds are normal. He exhibits no distension. There is no tenderness.  Musculoskeletal: He exhibits no edema.  Neurological: He is easily aroused.    distracted. He was unable to make eye contact or keep eyes open. Language of confusion noted. Restless.   Moves all extremities without difficulty. Left side less spontaneous with movement than right but he still moves all 4's. Senses pain in all 4. Oriented to name only.  Skin: Skin is warm and dry.  Psychiatric:  Confused, alert, fairly cooperative. Non-agitated, restless  Assessment/Plan: 1. 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 Physiatrist is providing close team supervision and 24 hour  management of active medical problems listed below. Physiatrist and rehab team continue to assess barriers to discharge/monitor patient progress toward functional and medical goals. FIM:                   Comprehension Comprehension Mode: Auditory Comprehension: 3-Understands basic 50 - 74% of the time/requires cueing 25 - 50%  of the time  Expression Expression Mode: Verbal Expression: 3-Expresses basic 50 - 74% of the time/requires cueing 25 - 50% of the time. Needs to repeat parts of sentences.  Social Interaction Social Interaction: 3-Interacts appropriately 50 - 74% of the time - May be physically or verbally inappropriate.        Medical Problem List and Plan: 1. Functional deficits secondary to TBI 2. DVT Prophylaxis/Anticoagulation: Pharmaceutical: Lovenox 3. Pain Management: Will continue oxycodone prn. Will likely need premedication as unable to express needs. Will monitor for now.  4. Mood: Unable to gauge at this time due to cognitive deficits. LCSW to follow along for evaluation and support once more appropriate.  5. Neuropsych: This patient is not capable of making decisions on her own behalf. -change to vail bed today 6. Skin/Wound Care: Pressure relief measures. Maintain adequate hydration and nutritional status as able.  7. Fluids/Electrolytes/Nutrition: Continue nutritional supplements.   8. MSSA HCAP: Completed 10 day course of antibiotic regimen 01/17.  9. Reactive Leucocytosis: Resolving. Monitor temp curve and for other signs of infection. 10. Hypokalemia: Likely due to poor nutritional status as well as dilutional . Will d/c IVF and monitor  for now.  11. Agitation: Sedated currently. Decrease Seroquel  -consider vpa - Continue Klonopin 0.5 Mg tid.  -can try librium prn for agitation/anxiety.  -Monitor sleep wake cycle.  -Continue restraints for fall prevention  and due to poor insight/awareness-vail soon 12 Activation:  ritalin to help with activation and attention.  LOS (Days) 1 A FACE TO FACE EVALUATION WAS PERFORMED  SWARTZ,ZACHARY T 03/10/2014 8:17 AM
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
## 2014-03-11 ENCOUNTER — Inpatient Hospital Stay (HOSPITAL_COMMUNITY): Payer: Medicaid Other | Admitting: *Deleted

## 2014-03-11 ENCOUNTER — Inpatient Hospital Stay (HOSPITAL_COMMUNITY): Payer: Self-pay

## 2014-03-11 ENCOUNTER — Encounter (HOSPITAL_COMMUNITY): Payer: Self-pay

## 2014-03-11 ENCOUNTER — Inpatient Hospital Stay (HOSPITAL_COMMUNITY): Payer: Medicaid Other

## 2014-03-11 MED ORDER — QUETIAPINE FUMARATE 50 MG PO TABS
150.0000 mg | ORAL_TABLET | Freq: Two times a day (BID) | ORAL | Status: DC
  Administered 2014-03-11 – 2014-03-12 (×2): 150 mg via ORAL
  Filled 2014-03-11 (×4): qty 1

## 2014-03-11 MED ORDER — SENNOSIDES-DOCUSATE SODIUM 8.6-50 MG PO TABS
2.0000 | ORAL_TABLET | Freq: Every day | ORAL | Status: DC
  Administered 2014-03-11 – 2014-03-18 (×8): 2 via ORAL
  Filled 2014-03-11 (×8): qty 2

## 2014-03-11 MED ORDER — PRO-STAT SUGAR FREE PO LIQD
30.0000 mL | Freq: Three times a day (TID) | ORAL | Status: DC
  Administered 2014-03-12 – 2014-03-15 (×5): 30 mL via ORAL
  Filled 2014-03-11 (×20): qty 30
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
## 2014-03-11 NOTE — Progress Notes (Signed)
[facility] PHYSICAL MEDICINE & REHABILITATION     PROGRESS NOTE    Subjective/Complaints: Actually was fairly participative yesterday in therapies. Slept well last night.  Objective: Vital Signs: Blood pressure 147/78, pulse 97, temperature 97.8 F (36.6 C), temperature source Oral, resp. rate 18, weight 85.276 kg (188 lb), SpO2 99 %. No results found.  Recent Labs  03/10/14 0634  WBC 11.5*  HGB 14.3  HCT 42.8  PLT 375    Recent Labs  03/10/14 0634  NA 142  K 4.1  CL 104  GLUCOSE 104*  BUN 15  CREATININE 0.92  CALCIUM 9.9   CBG (last 3)  No results for input(s): GLUCAP in the last 72 hours.  Wt Readings from Last 3 Encounters:  03/10/14 85.276 kg (188 lb)  03/09/14 84.188 kg (185 lb 9.6 oz)  01/23/14 95.255 kg (210 lb)    Physical Exam:  Constitutional: He appears well-developed and well-nourished. He is sleeping. He is easily aroused. He is sedated.  In four points restraints.  HENT:  Head: Normocephalic.  Neck: supple Cardiovascular: Normal rate and regular rhythm.  Respiratory: Effort normal. No respiratory distress.  GI: Soft. Bowel sounds are normal. He exhibits no distension. There is no tenderness.  Musculoskeletal: He exhibits no edema.  Neurological: He is easily aroused.   restless.   Moves all extremities without difficulty. Left side less spontaneous with movement than right but he still moves all 4's. Senses pain in all 4. Oriented to name only.  Skin: Skin is warm and dry.  Psychiatric:  Confused, alert, fairly cooperative. Non-agitated, restless  Assessment/Plan: 1. 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 Physiatrist is providing close team supervision and 24 hour management of active medical problems listed below. Physiatrist and rehab team continue to assess barriers to discharge/monitor patient progress toward functional and  medical goals. FIM: FIM - Bathing Bathing Steps Patient Completed: Chest, Right Arm, Left Arm, Abdomen Bathing: 2: Max-Patient completes 3-4 2f 10 parts or 25-49% (pt declined washing all other body parts)  FIM - Upper Body Dressing/Undressing Upper body dressing/undressing: 0: Wears gown/pajamas-no public clothing FIM - Lower Body Dressing/Undressing Lower body dressing/undressing steps patient completed: Thread/unthread left underwear leg, Thread/unthread right underwear leg, Don/Doff left sock, Don/Doff right sock Lower body dressing/undressing: 4: Min-Patient completed 75 plus % of tasks        FIM - Bed/Chair Transfer Bed/Chair Transfer: 4: Supine > Sit: Min A (steadying Pt. > 75%/lift 1 leg), 5: Sit > Supine: Supervision (verbal cues/safety issues), 4: Chair or W/C > Bed: Min A (steadying Pt. > 75%), 4: Bed > Chair or W/C: Min A (steadying Pt. > 75%)  FIM - Locomotion: Wheelchair Locomotion: Wheelchair: 0: Activity did not occur (patient ambulatory) FIM - Locomotion: Ambulation Ambulation/Gait Assistance: 4: Min assist, 3: Mod assist Locomotion: Ambulation: 2: Travels 50 - 149 ft with moderate assistance (Pt: 50 - 74%)  Comprehension Comprehension Mode: Auditory Comprehension: 3-Understands basic 50 - 74% of the time/requires cueing 25 - 50%  of the time  Expression Expression Mode: Verbal Expression: 2-Expresses basic 25 - 49% of the time/requires cueing 50 - 75% of the time. Uses single words/gestures.  Social Interaction Social Interaction: 2-Interacts appropriately 25 - 49% of time - Needs frequent redirection.  Problem Solving Problem Solving: 3-Solves basic 50 - 74% of the time/requires cueing 25 - 49% of the time  Memory Memory: 2-Recognizes or recalls 25 - 49% of  the time/requires cueing 51 - 75% of the time  Medical Problem List and Plan: 1. Functional deficits secondary to TBI 2. DVT Prophylaxis/Anticoagulation: Pharmaceutical: Lovenox 3. Pain Management:  Will continue oxycodone prn. Will likely need premedication as unable to express needs. Will monitor for now.  4. Mood: Unable to gauge at this time due to cognitive deficits. LCSW to follow along for evaluation and support once more appropriate.  5. Neuropsych: This patient is not capable of making decisions on her own behalf. -now in vail bed 6. Skin/Wound Care: Pressure relief measures. Maintain adequate hydration and nutritional status as able.  7. Fluids/Electrolytes/Nutrition: Continue nutritional supplements.   8. MSSA HCAP: Completed 10 day course of antibiotic regimen 01/17.  9. Reactive Leucocytosis: Resolving.  10. Hypokalemia: Likely due to poor nutritional status as well as dilutional . Will d/c IVF and monitor for now.  11. Agitation: Decreasing Seroquel  -consider vpa - Continue Klonopin 0.5 Mg tid.  -can try librium prn for agitation/anxiety.  -Monitor sleep wake cycle.  -Continue restraints for fall prevention and due to poor insight/awareness-vail soon 12 Activation:  ritalin to help with activation and attention.  LOS (Days) 2 A FACE TO FACE EVALUATION WAS PERFORMED  Gaurav Baldree T 03/11/2014 8:06 AM

## 2014-03-11 NOTE — Progress Notes (Signed)
Occupational Therapy Session Note  Patient Details  Name: Justin CohoJeffrey L Atha MRN: [id number] Date of Birth: Jul 30, 1955  Today's Date: 03/11/2014 OT Individual Time: 1015-1055 and 1400-1435 OT Individual Time Calculation (min): 40 min and 35 min     Short Term Goals: Week 1:  OT Short Term Goal 1 (Week 1): Pt will demonstrate sustained attention to self-care task for 1 min with mod cues OT Short Term Goal 2 (Week 1): Pt will consistently be oriented to x3 with max cues OT Short Term Goal 3 (Week 1): Pt will complete self-care task in standing for 1 min with min assist for balance OT Short Term Goal 4 (Week 1): Pt will complete bathing task with min assist and mod cues for sequencing and attention  Skilled Therapeutic Interventions/Progress Updates:    Session 1: Pt seen for ADL retraining with focus on sustained attention, sequencing, dynamic standing balance, safety awareness, and functional mobility. Pt received sitting EOB with RN present and very eager to shower. Ambulated to bathroom with min HHA. Completed bathing with max A and max cues due to perseveration on washing hair. Pt donned brief and socks with increased time due to poor sustained attention. Ambulated on therapy unit approx 50' before pt began to fatigue and returned to room. Completed toilet task with min A for balance. Pt oriented to person and city with min cues. Pt demonstrating confabulation and language of confusion throughout therapy session. Pt returned to bed and left with enclosure bed with all needs in reach. Pt missing 20 min due to fatigue.   Session 2: Pt seen for therapeutic co-tx with TR (LS) with focus on orientation, focused/sustained attention, and functional mobility. Pt received supine in bed. Engaged in therapeutic conversation in regards to orientation and pt's interests. Pt demonstrating language of confusion and confabulation, requiring max-total A for redirection and attention to task. Pt able to verbalize  city and county of hospital once therapist provided name of hospital. Ambulated to day room with min A. Engaged in card game of "crazy 8's" per pt's choice. Pt combining rules of crazy 8's with rummy, however unaware despite therapists correcting him. Pt also required cues for turn taking, as he often skipped TR during game. Pt returned to room and left with all needs in reach in enclosure bed.   Therapy Documentation Precautions:  Precautions Precautions: Fall Restrictions Weight Bearing Restrictions: No General: General OT Amount of Missed Time: 20 Minutes Vital Signs:  Pain: Pain Assessment Pain Assessment: No/denies pain  See FIM for current functional status  Therapy/Group: Individual Therapy  Selden Noteboom, Vara GuardianKayla N 03/11/2014, 11:01 AM

## 2014-03-11 NOTE — Progress Notes (Addendum)
Physical Therapy Session Note  Patient Details  Name: Justin CohoJeffrey L Pugh MRN: [id number] Date of Birth: [birth year]  Today's Date: 03/11/2014 PT Individual Time: 1300-1349 and 1530-1617 PT Individual Time Calculation (min): 49 min and 47 min   Short Term Goals: Week 1:  PT Short Term Goal 1 (Week 1): Patient will perform bed mobility and functional transfers with supervision. PT Short Term Goal 2 (Week 1): Patient will perform functional ambulation >150' with consistent supervision. PT Short Term Goal 3 (Week 1): Patient will negotiate flight of stairs with one handrail and supervision. PT Short Term Goal 4 (Week 1): Patient will sustain attention to therapeutic activity x30" with mod cues. PT Short Term Goal 5 (Week 1): Patient will demonstrate intellectual awareness in controlled environment with min cues.  Skilled Therapeutic Interventions/Progress Updates:   First session: Patient received sitting edge of enclosure bed with RN present. Session focused on increasing activity tolerance with functional mobility, cognitive remediation, and sustained attention. Patient requires mod encouragement/coaxing to get OOB and motivated to attempt going to the bathroom. Patient performed functional ambulation in room, in/out of bathroom, gathering items >50' with minA overall, no AD. Patient sat on toilet, but unable to have bowel movement.  Patient motivated to return items to the gym and to retrieve hot pack. Functional ambulation carrying items with minA >150' in controlled environment, mod cues for negotiation of obstacles/people. Patient performs sit<>supine on treatment mat with supervision, remains supine with hot pack on abdomen x5'. Patient encouraged to change locations from gym to day room and patient impulsively sitting on rolling stool, requiring minA to maintain balance. Patient proceeds to use B LEs to propel rolling stool approx 110' with minA to assist with patient maintaining balance on stool.  Patient able to be convinced to stand and ambulate remainder of distance, patient carrying hot pack with him 24>150' with minA and no UE support. Patient sits with hot pack on back for approx 3-4 min before wanting to return to room. Unable to coax patient to return to bed and therefore left sitting in recliner with seatbelt donned, transported to day room for nurse tech to attempt to motivate patient to consume lunch.  Second session: Patient received sitting in recliner with seatbelt donned, mother present. CSW present to speak with patient's mother. Patient able to don shoes without assistance. Session focused on functional ambulation, cognitive remediation, and sustained attention. Functional ambulation >500' x2 to [employer] [place]. Patient resting and reading magazine x3'. In day room, patient initiated pipe tree activity, completing 75% without cues until becoming internally distracted and getting up from table to continue ambulation. Patient refused to return to bed at end of session, but sits in recliner, reclines it, and closes eyes. Patient left sitting in recliner with seatbelt donned and RN aware.  Noted patient with repeated inappropriate touching of therapist throughout session, requiring redirection.  Therapy Documentation Precautions:  Precautions Precautions: Fall Restrictions Weight Bearing Restrictions: No General: PT Amount of Missed Time (min): 11 Minutes, 13 minutes PT Missed Treatment Reason: Patient fatigue (cognitive fatigue due to TBI) Pain: Pain Assessment Pain Assessment: No/denies pain Pain Score: 0-No pain Locomotion : Ambulation Ambulation/Gait Assistance: 4: Min assist   See FIM for current functional status  Therapy/Group: Individual Therapy  Chipper HerbBridget S Krisy Dix S. [place] Brandi, PT, DPT 03/11/2014, 1:56 PM

## 2014-03-11 NOTE — Progress Notes (Signed)
Speech Language Pathology Daily Session Note  Patient Details  Name: Justin Pugh MRN: [id number] Date of Birth: 1955/12/26  Today's Date: 03/11/2014 SLP Individual Time: [id number] SLP Individual Time Calculation (min): 60 min  Short Term Goals: Week 1: SLP Short Term Goal 1 (Week 1): Pt will utilize compensatory strategies to maximize safety with swallowing with recommended diet with Mod cues SLP Short Term Goal 2 (Week 1): Pt will sustain attention to basic, familiar task in structured environment for 10 minutes with Max cues SLP Short Term Goal 3 (Week 1): Pt will utilize external aids and environmental cues to demonstrate orientation x4 with Max cues SLP Short Term Goal 4 (Week 1): Pt will demonstrate intellectual awareness of at least one cognitive and one physical deficit with Max cues SLP Short Term Goal 5 (Week 1): Pt will follow one-step commands with 80% accuracy throughout basic, functional tasks in structured environment with Mod cues  Skilled Therapeutic Interventions: Skilled treatment focused on cognitive and swallowing goals. SLP facilitated session with Max multimodal cueing for sustained attention to PO intake, with pt consuming very little due to internal distractions. Attempts to modify the environment and reduce distractions minimally increased attention span. SLP administered advanced trials of thin liquids with immediate coughing noted as pt was impulsive with his rate of intake. Pt was oriented to self and [place] but required Max-Total A for orientation to location and situation, with confabulation observed.     FIM:  Comprehension Comprehension Mode: Auditory Comprehension: 3-Understands basic 50 - 74% of the time/requires cueing 25 - 50%  of the time Expression Expression Mode: Verbal Expression: 2-Expresses basic 25 - 49% of the time/requires cueing 50 - 75% of the time. Uses single words/gestures. Social Interaction Social  Interaction: 2-Interacts appropriately 25 - 49% of time - Needs frequent redirection. Problem Solving Problem Solving: 3-Solves basic 50 - 74% of the time/requires cueing 25 - 49% of the time Memory Memory: 2-Recognizes or recalls 25 - 49% of the time/requires cueing 51 - 75% of the time FIM - Eating Eating Activity: 5: Needs verbal cues/supervision  Pain Pain Assessment Pain Assessment: No/denies pain  Therapy/Group: Individual Therapy   Maxcine HamLaura Paiewonsky, M.A. CCC-SLP [phone]  Maxcine Hamaiewonsky, Tula Schryver 03/11/2014, 9:52 AM

## 2014-03-11 NOTE — Progress Notes (Signed)
Pt last BM 1-16. Notfiied [name], PA with new orders of sennokot 2 tabs HS. Will cont. To monitor pt.
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
## 2014-03-11 NOTE — Plan of Care (Signed)
Problem: RH BOWEL ELIMINATION Goal: RH STG MANAGE BOWEL WITH ASSISTANCE STG Manage Bowel with Assistance.  Outcome: Not Progressing LBM 03-07-14 Goal: RH STG MANAGE BOWEL W/MEDICATION W/ASSISTANCE STG Manage Bowel with Medication with Assistance.  Outcome: Not Progressing LBM 03-07-14
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
## 2014-03-12 ENCOUNTER — Inpatient Hospital Stay (HOSPITAL_COMMUNITY): Payer: Self-pay | Admitting: Speech Pathology

## 2014-03-12 ENCOUNTER — Encounter (HOSPITAL_COMMUNITY): Payer: Self-pay

## 2014-03-12 ENCOUNTER — Inpatient Hospital Stay (HOSPITAL_COMMUNITY): Payer: Self-pay

## 2014-03-12 ENCOUNTER — Inpatient Hospital Stay (HOSPITAL_COMMUNITY): Payer: Medicaid Other

## 2014-03-12 MED ORDER — QUETIAPINE FUMARATE 100 MG PO TABS
100.0000 mg | ORAL_TABLET | Freq: Two times a day (BID) | ORAL | Status: DC
  Administered 2014-03-12 – 2014-03-20 (×16): 100 mg via ORAL
  Filled 2014-03-12 (×18): qty 1

## 2014-03-12 MED ORDER — SORBITOL 70 % SOLN
45.0000 mL | Freq: Once | Status: AC
  Administered 2014-03-12: 45 mL via ORAL
  Filled 2014-03-12: qty 60

## 2014-03-12 NOTE — Progress Notes (Signed)
[employer] PHYSICAL MEDICINE & REHABILITATION     PROGRESS NOTE    Subjective/Complaints: No new issues. In bed comfortable. Waiting to get out so he can eat breakfast.  Objective: Vital Signs: Blood pressure 130/78, pulse 104, temperature 97.8 F (36.6 C), temperature source Oral, resp. rate 19, weight 85.276 kg (188 lb), SpO2 98 %. No results found.  Recent Labs  03/10/14 0634  WBC 11.5*  HGB 14.3  HCT 42.8  PLT 375    Recent Labs  03/10/14 0634  NA 142  K 4.1  CL 104  GLUCOSE 104*  BUN 15  CREATININE 0.92  CALCIUM 9.9   CBG (last 3)  No results for input(s): GLUCAP in the last 72 hours.  Wt Readings from Last 3 Encounters:  03/10/14 85.276 kg (188 lb)  03/09/14 84.188 kg (185 lb 9.6 oz)  01/23/14 95.255 kg (210 lb)    Physical Exam:  Constitutional: He appears well-developed and well-nourished. He is sleeping. He is easily aroused. He is sedated.  In four points restraints.  HENT:  Head: Normocephalic.  Neck: supple Cardiovascular: Normal rate and regular rhythm.  Respiratory: Effort normal. No respiratory distress.  GI: Soft. Bowel sounds are normal. He exhibits no distension. There is no tenderness.  Musculoskeletal: He exhibits no edema.  Neurological: He is easily aroused.   restless.   Moves all extremities without difficulty. Left side less spontaneous with movement than right but he still moves all 4's. Senses pain in all 4. Oriented to name only.  Skin: Skin is warm and dry.  Psychiatric:  Confused, alert, fairly cooperative. Non-agitated, restless  Assessment/Plan: 1. 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 Physiatrist is providing close team supervision and 24 hour management of active medical problems listed below. Physiatrist and rehab team continue to assess barriers to discharge/monitor patient progress toward functional and  medical goals. FIM: FIM - Bathing Bathing Steps Patient Completed: Chest, Abdomen, Front perineal area Bathing: 2: Max-Patient completes 3-4 5060f 10 parts or 25-49%  FIM - Upper Body Dressing/Undressing Upper body dressing/undressing: 0: Wears gown/pajamas-no public clothing FIM - Lower Body Dressing/Undressing Lower body dressing/undressing steps patient completed: Thread/unthread left underwear leg, Thread/unthread right underwear leg, Don/Doff left sock, Don/Doff right sock, Pull underwear up/down Lower body dressing/undressing: 4: Steadying Assist  FIM - Toileting Toileting steps completed by patient: Adjust clothing prior to toileting, Performs perineal hygiene, Adjust clothing after toileting Toileting: 4: Steadying assist  FIM - ArchivistToilet Transfers Toilet Transfers: 4-From toilet/BSC: Min A (steadying Pt. > 75%), 4-To toilet/BSC: Min A (steadying Pt. > 75%)  FIM - Bed/Chair Transfer Bed/Chair Transfer: 4: Bed > Chair or W/C: Min A (steadying Pt. > 75%), 4: Chair or W/C > Bed: Min A (steadying Pt. > 75%), 5: Supine > Sit: Supervision (verbal cues/safety issues), 5: Sit > Supine: Supervision (verbal cues/safety issues)  FIM - Locomotion: Wheelchair Locomotion: Wheelchair: 0: Activity did not occur (patient ambulatory) FIM - Locomotion: Ambulation Ambulation/Gait Assistance: 4: Min assist Locomotion: Ambulation: 4: Travels 150 ft or more with minimal assistance (Pt.>75%)  Comprehension Comprehension Mode: Auditory Comprehension: 3-Understands basic 50 - 74% of the time/requires cueing 25 - 50%  of the time  Expression Expression Mode: Verbal Expression: 2-Expresses basic 25 - 49% of the time/requires cueing 50 - 75% of the time. Uses single words/gestures.  Social Interaction Social Interaction: 2-Interacts appropriately 25 - 49% of time - Needs frequent redirection.  Problem Solving Problem  Solving: 1-Solves basic less than 25% of the time - needs direction nearly all the time  or does not effectively solve problems and may need a restraint for safety  Memory Memory: 1-Recognizes or recalls less than 25% of the time/requires cueing greater than 75% of the time  Medical Problem List and Plan: 1. Functional deficits secondary to TBI 2. DVT Prophylaxis/Anticoagulation: Pharmaceutical: Lovenox 3. Pain Management: Will continue oxycodone prn. Will likely need premedication as unable to express needs. Will monitor for now.  4. Mood: Unable to gauge at this time due to cognitive deficits. LCSW to follow along for evaluation and support once more appropriate.  5. Neuropsych: This patient is not capable of making decisions on her own behalf. -now in vail bed---still requires for safety 6. Skin/Wound Care: Pressure relief measures. Maintain adequate hydration and nutritional status as able.  7. Fluids/Electrolytes/Nutrition: Continue nutritional supplements.   8. MSSA HCAP: Completed 10 day course of antibiotic regimen 01/17.  9. Reactive Leucocytosis: Resolving.  10. Hypokalemia: Likely due to poor nutritional status as well as dilutional . Will d/c IVF and monitor for now.  11. Agitation: Decreasing Seroquel  -consider vpa - Continue Klonopin 0.5 Mg tid.  -can try librium prn for agitation/anxiety.  -Monitor sleep wake cycle.  -Continue vail bed fall prevention and due to poor insight/awareness-vail soon 12 Activation:  ritalin to help with activation and attention.  LOS (Days) 3 A FACE TO FACE EVALUATION WAS PERFORMED  Justin Pugh T 03/12/2014 8:47 AM

## 2014-03-12 NOTE — Progress Notes (Signed)
Physical Therapy Session Note  Patient Details  Name: Justin Pugh MRN: [id number] Date of Birth: December 26, 1955  Today's Date: 03/12/2014 PT Individual Time: 0800-0900 PT Individual Time Calculation (min): 60 min   Short Term Goals: Week 1:  PT Short Term Goal 1 (Week 1): Patient will perform bed mobility and functional transfers with supervision. PT Short Term Goal 2 (Week 1): Patient will perform functional ambulation >150' with consistent supervision. PT Short Term Goal 3 (Week 1): Patient will negotiate flight of stairs with one handrail and supervision. PT Short Term Goal 4 (Week 1): Patient will sustain attention to therapeutic activity x30" with mod cues. PT Short Term Goal 5 (Week 1): Patient will demonstrate intellectual awareness in controlled environment with min cues.  Skilled Therapeutic Interventions/Progress Updates:  Patient resting in enclosure bed upon entering room. Session was co-treatment with recreation therapy and focused on functional mobility, balance, cognitive remediation w/emphasis on intellectual awareness, sustained attention, and orientation. Patient reported being in "recovery center" and had no response for other orientation/awareness questions even with cueing to use clock and calendar in room. Patient with multiple attempts to drink water even with explanations of swallowing precautions and need for nectar thick liquids. Attempted to have patient eat breakfast which he took 2 bites and was not interested. Patient to sink and washed face and hands. Patient donned shoes independently. Patient to toilet with min steady assist and performed toileting tasks (pants down/up and hygiene) with steady assist for balance. Patient ambulated 1000+ feet with min steady assist for balance. Patient ambulated with narrow base of support and bilateral internal rotation. Patient sat and played crazy eight card game for 10 minutes in minimally distractive environment. Patient dealt  the cards and attended to the game following rules correctly. Patient required max cueing to find way back to room. Patient attempted to have BM with no success reporting increase in pain. Patient left lying in bed with nurse in room to give medications.  Therapy Documentation Precautions:  Precautions Precautions: Fall Restrictions Weight Bearing Restrictions: No  Pain: Patient unable to give a number to pain. Patient very restless throughout session and reporting that he was in pain. He seemed to be indicating back and abdomen pain and needing to use the bathroom. Patient unable to have BM with 2 attempts.   Locomotion : Ambulation Ambulation/Gait Assistance: 4: Min assist    See FIM for current functional status  Therapy/Group: Co treatment with recreation therapy  Arelia LongestWindsor, Ilma Achee M 03/12/2014, 9:10 AM

## 2014-03-12 NOTE — Progress Notes (Signed)
Physical Therapy Session Note  Patient Details  Name: Justin Pugh MRN: [id number] Date of Birth: February 20, 1956  Today'Pugh Date: 03/12/2014 PT Individual Time: 1430-1530 PT Individual Time Calculation (min): 60 min   Short Term Goals: Week 1:  PT Short Term Goal 1 (Week 1): Patient will perform bed mobility and functional transfers with supervision. PT Short Term Goal 2 (Week 1): Patient will perform functional ambulation >150' with consistent supervision. PT Short Term Goal 3 (Week 1): Patient will negotiate flight of stairs with one handrail and supervision. PT Short Term Goal 4 (Week 1): Patient will sustain attention to therapeutic activity x30" with mod cues. PT Short Term Goal 5 (Week 1): Patient will demonstrate intellectual awareness in controlled environment with min cues.  Skilled Therapeutic Interventions/Progress Updates:  1:1. Pt received sitting in recliner with RN in room, PT taking over. Focus this session on activity tolerance, cognitive remediation, balance, safety during functional transfers and mobility. Pt able to don shoes/socks at start of session with out cues. Pt req steadying assist for toileting at start of session as well as attempted toileting at end of session with mod cues for seq to wash hands. Overall, pt req close(Pugh)-min guard A for standing balance, multiple bouts of ambulation this session 200'x5 as well as all frequent transfers sit<>stand and stand to sit due to restlessness and impulsivity.   Pt req mod-max cues to sustain attention to finding horseshoes in quiet ortho gym req pt to look/reach in various places including performance of floor transfer. Pt req min cues for sustained attention to tossing horseshoes at target once all were located.   Pt req overall mod A for sustained attention to looking for room several times throughout session in order to find his water and while playing card game of "War" with 75% accuracy regarding determining winner of each  draw.   Pt very confabulatory and tangential throughout tx session when attempting to engage in conversation or asking orientation questions. Pt left supine in posey bed at end of session with all needs in reach.    Therapy Documentation Precautions:  Precautions Precautions: Fall Restrictions Weight Bearing Restrictions: No  See FIM for current functional status  Therapy/Group: Individual Therapy  Justin Pugh, Justin Pugh 03/12/2014, 4:57 PM

## 2014-03-12 NOTE — Progress Notes (Signed)
Speech Language Pathology Daily Session Note  Patient Details  Name: Katrine CohoJeffrey L Kalp MRN: [id number] Date of Birth: 11-09-1955  Today's Date: 03/12/2014 SLP Individual Time: 1030-1100 SLP Individual Time Calculation (min): 30 min  Short Term Goals: Week 1: SLP Short Term Goal 1 (Week 1): Pt will utilize compensatory strategies to maximize safety with swallowing with recommended diet with Mod cues SLP Short Term Goal 2 (Week 1): Pt will sustain attention to basic, familiar task in structured environment for 10 minutes with Max cues SLP Short Term Goal 3 (Week 1): Pt will utilize external aids and environmental cues to demonstrate orientation x4 with Max cues SLP Short Term Goal 4 (Week 1): Pt will demonstrate intellectual awareness of at least one cognitive and one physical deficit with Max cues SLP Short Term Goal 5 (Week 1): Pt will follow one-step commands with 80% accuracy throughout basic, functional tasks in structured environment with Mod cues  Skilled Therapeutic Interventions:  Pt was seen for skilled ST targeting goals for dysphagia and cognition.  Upon arrival, pt was reclined in enclosure bed,asleep but easily awakened to voice and light touch, agreeable to participate in ST.  Pt transferred to recliner and SLP provided mod cues for initiation and sequencing to complete thorough oral care prior to initiation of PO trials of regular water.  SLP completed skilled observations during trials of regular water via cup sips with pt exhibiting immediate cough on ~75% of trials.  Additionally, SLP facilitated the session with a structured visuoconstructional task targeting functional problem solving and sustained attention.  During the abovementioned task, pt required mod assist for sequencing, problem solving, and organization.  Continue per current plan of care.   FIM:  Comprehension Comprehension Mode: Auditory Comprehension: 3-Understands basic 50 - 74% of the time/requires cueing 25 - 50%   of the time Expression Expression Mode: Verbal Expression: 2-Expresses basic 25 - 49% of the time/requires cueing 50 - 75% of the time. Uses single words/gestures. Social Interaction Social Interaction: 2-Interacts appropriately 25 - 49% of time - Needs frequent redirection. Problem Solving Problem Solving: 1-Solves basic less than 25% of the time - needs direction nearly all the time or does not effectively solve problems and may need a restraint for safety Memory Memory: 1-Recognizes or recalls less than 25% of the time/requires cueing greater than 75% of the time FIM - Eating Eating Activity: 5: Needs verbal cues/supervision  Pain Pain Assessment Pain Assessment: No/denies pain   Therapy/Group: Individual Therapy  Jesalyn Finazzo, Melanee SpryNicole L 03/12/2014, 12:30 PM

## 2014-03-12 NOTE — Progress Notes (Signed)
Occupational Therapy Session Note  Patient Details  Name: Justin Pugh MRN: [id number] Date of Birth: 1956-01-16  Today's Date: 03/12/2014 OT Individual Time: 1100-1150 and 1300-1400 OT Individual Time Calculation (min): 50 min and 60 min     Short Term Goals: Week 1:  OT Short Term Goal 1 (Week 1): Pt will demonstrate sustained attention to self-care task for 1 min with mod cues OT Short Term Goal 2 (Week 1): Pt will consistently be oriented to x3 with max cues OT Short Term Goal 3 (Week 1): Pt will complete self-care task in standing for 1 min with min assist for balance OT Short Term Goal 4 (Week 1): Pt will complete bathing task with min assist and mod cues for sequencing and attention  Skilled Therapeutic Interventions/Progress Updates:    Session 1: Pt seen for 1:1 OT session with focus on orientation, sustained attention, functional mobility, intellectual awareness, and balance. Pt received supine in bed declining showering this AM. Pt required more than reasonable amount of time to sit EOB due to fatigue. Engaged in therapeutic conversation in regards to orientation and intellectual awareness. Pt reporting being at "rehabilitation center" however unable to state he was at a hospital or name of hospital. Pt oriented to birth date and age with increased time.  Pt perseverating on drinking water and declining nectar thick water. Pt ambulated about room picking items up then placing in drawers at steadying assist. Pt agreeable to leave room and ambulated to [employer]ft with steadying assist. Pt required long rest break and demonstrated no carryover of orientation or intellectual awareness. Ambulated back to room with pt holding basketball however declining dribbling ball to challenge balance due to disturbing others. Pt perseverating on water again, however agreeable to nectar thick. Pt took one sip then declined more. Pt removed shirt then transitioned to supine, declining furthe  participation. Pt missing 10 min due to fatigue. Pt demonstrated 3 instances of inappropriate touching of therapist requiring redirection.  Session 2: Pt seen for 1:1 OT session with focus on functional mobility, sustained attention, therapeutic exercise, and overall cognitive remediation. Pt received supine in bed oriented to person only. With increased time and encouragement, pt completed supine>sit and donned shoes. Ambulated to therapy gym at steady assist. Completed simple and difficult pipe puzzle tasks with increased time and 100% accuracy. Pt sustained attention to task for 3 min with mod cues. Attempted to engaged in dynamic balance task of tossing ball in standing however pt internally and externally distracted. Engaged in core and UB strengthening tasks of 2 sets x10 reps of push-ups with pt keeping count with 80% accuracy. Pt engaged in appropriate conversation with training therapist in regards [employer] for 3 min. Therapist provided room number to pt and ambulated gym>room with increased time and pt repeating room number in head as memory strategy. Required total A to change directions when going down hallway for extended tim as pt recognized numbers were going lower however no initiation to change direction. Pt recognizing he was having difficulty locating room and asked RN for direction. Pt then able to locate room. Pt returned to bed and left with all needs in reach.   Therapy Documentation Precautions:  Precautions Precautions: Fall Restrictions Weight Bearing Restrictions: No General: General OT Amount of Missed Time: 10 Minutes Vital Signs:  Pain: Pain Assessment Pain Assessment: 0-10 Pain Score: 5  Pain Location: Back Pain Orientation: Mid;Lower Pain Onset: On-going Patients Stated Pain Goal: 3 Pain Intervention(s): Medication (See eMAR)  See  FIM for current functional status  Therapy/Group: Individual Therapy  Daneil Dan 03/12/2014, 11:56 AM

## 2014-03-12 NOTE — Progress Notes (Signed)
Recreational Therapy Session Note  Patient Details  Name: Justin Pugh MRN: [id number] Date of Birth: October 25, 1955 Today's Date: 03/12/2014  Pain: restless & with c/o pain- back & abdominal, unable to rate, RN aware & addressing Skilled Therapeutic Interventions/Progress Updates: Session focused on activity tolerance, functional mobility, sustained & selective attention, orientation & awareness.  Pt not oriented to place or situation during session even when cued to use external/visual aids.  Pt requesting to drink water throughout session, reviewed swallowing guidelines & precautions.  Pt refused to eat breakfast although encouraged throughout session.  Pt required min assist(contact guard) for toileting, ambulation >1000'.  During seated rest break, pt played Crazy 8's card game ~10 minutes in minimally distractive environment with independence.  Pt required max cues for pathfinding [facility] way [facility]  Pt with 3 attempts to toilet throughout session without success. At end of session pt more restless and with increased c/o abdominal pain.  RN at bedside & addressing.  Therapy/Group: Co-Treatment  Talah Cookston 03/12/2014, 11:38 AM

## 2014-03-12 NOTE — Plan of Care (Signed)
Problem: RH KNOWLEDGE DEFICIT Goal: RH STG INCREASE KNOWLEDGE OF DYSPHAGIA/FLUID INTAKE Pt will be able to state dietary restrictions and rationale for maintaining safety Outcome: Not Progressing Needs encouragement for thickened liquids and dysphagia restrictions
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
## 2014-03-13 ENCOUNTER — Inpatient Hospital Stay (HOSPITAL_COMMUNITY): Payer: Medicaid Other | Admitting: Speech Pathology

## 2014-03-13 ENCOUNTER — Inpatient Hospital Stay (HOSPITAL_COMMUNITY): Payer: Medicaid Other | Admitting: *Deleted

## 2014-03-13 ENCOUNTER — Inpatient Hospital Stay (HOSPITAL_COMMUNITY): Payer: Self-pay

## 2014-03-13 ENCOUNTER — Inpatient Hospital Stay (HOSPITAL_COMMUNITY): Payer: Medicaid Other

## 2014-03-13 MED ORDER — SUCRALFATE 1 GM/10ML PO SUSP
1.0000 g | Freq: Three times a day (TID) | ORAL | Status: DC
  Administered 2014-03-13 – 2014-04-07 (×75): 1 g via ORAL
  Filled 2014-03-13 (×104): qty 10

## 2014-03-13 NOTE — Progress Notes (Signed)
Pt complaining of epigastric pain and burning with anything taken po even water. Reports" does not  taste right and is the wrong consistency; something isn't right" and will stop eating or drinking. Does better overall with thickened water and will take medications is small amounts of magic cup or pudding however as soon as he swallows, reports burning sensation. Carafate given twice today. Explained rationale for medication and pt able to take med. Pamelia HoitSharp, Welford Christmas B

## 2014-03-13 NOTE — Progress Notes (Signed)
Physical Therapy Session Note  Patient Details  Name: Justin Pugh MRN: [id number] Date of Birth: [birth year]  Today's Date: 03/13/2014 PT Individual Time: 1500-1540 PT Individual Time Calculation (min): 40 min   Short Term Goals: Week 1:  PT Short Term Goal 1 (Week 1): Patient will perform bed mobility and functional transfers with supervision. PT Short Term Goal 2 (Week 1): Patient will perform functional ambulation >150' with consistent supervision. PT Short Term Goal 3 (Week 1): Patient will negotiate flight of stairs with one handrail and supervision. PT Short Term Goal 4 (Week 1): Patient will sustain attention to therapeutic activity x30" with mod cues. PT Short Term Goal 5 (Week 1): Patient will demonstrate intellectual awareness in controlled environment with min cues.  Skilled Therapeutic Interventions/Progress Updates:    Patient received supine in enclosure bed. Patient requires max cues and encouragement to sit EOB. Encouraged patient to consume lunch, but refuses several times throughout session. Entire session spent with patient ambulating around room, looking in drawers, sitting on toilet, changing sitting locations, etc. Patient requires total cues for redirection, but still unable to be directed out of room or to therapeutic activity within room. Patient eventually returned to enclosure bed, laid down, and rolled away from therapist stating "I just need to lay here, honey." Patient left supine in enclosure bed with all needs within reach; RN aware.  Therapy Documentation Precautions:  Precautions Precautions: Fall Restrictions Weight Bearing Restrictions: No General: PT Amount of Missed Time (min): 20 Minutes PT Missed Treatment Reason: Patient fatigue Pain: Pain Assessment Pain Assessment: Faces Faces Pain Scale: Hurts little more Locomotion : Ambulation Ambulation/Gait Assistance: 5: Supervision   See FIM for current functional status  Therapy/Group:  Individual Therapy  Chipper HerbBridget S Brandt Chaney S. Fallynn Gravett, PT, DPT  03/13/2014, 3:41 PM

## 2014-03-13 NOTE — Progress Notes (Signed)
Physical Therapy Session Note  Patient Details  Name: Justin CohoJeffrey L Dolder MRN: [id number] Date of Birth: 1955-02-23  Today's Date: 03/13/2014 PT Individual Time: 1400-1430 PT Individual Time Calculation (min): 30 min   Short Term Goals: Week 1:  PT Short Term Goal 1 (Week 1): Patient will perform bed mobility and functional transfers with supervision. PT Short Term Goal 2 (Week 1): Patient will perform functional ambulation >150' with consistent supervision. PT Short Term Goal 3 (Week 1): Patient will negotiate flight of stairs with one handrail and supervision. PT Short Term Goal 4 (Week 1): Patient will sustain attention to therapeutic activity x30" with mod cues. PT Short Term Goal 5 (Week 1): Patient will demonstrate intellectual awareness in controlled environment with min cues.  Skilled Therapeutic Interventions/Progress Updates:  1:1. Pt received supine in enclosure bed, RN present to address indigestion. Pt req mod encouragement to sit EOB to take medicine. Pt extremely restless this session due to ongoing indigestion frequently changing positions and walking around room with close(S)-min guard A, req consistent total cues for redirection to attempted therapeutic tasks including sustained sitting EOB, sitting in recliner to eat lunch or toileting. Pt unable to successfully demonstrate focused attention to any task. Pt left supine in enclosure bed with all needs in reach, RN aware.   Therapy Documentation Precautions:  Precautions Precautions: Fall Restrictions Weight Bearing Restrictions: No  See FIM for current functional status  Therapy/Group: Individual Therapy  Denzil HughesKing, Cythina Mickelsen S 03/13/2014, 3:35 PM

## 2014-03-13 NOTE — Progress Notes (Signed)
Speech Language Pathology Daily Session Note  Patient Details  Name: Justin CohoJeffrey L Batte MRN: [id number] Date of Birth: [birth year]  Today's Date: 03/13/2014 SLP Individual Time: 1000-1100 SLP Individual Time Calculation (min): 60 min  Short Term Goals: Week 1: SLP Short Term Goal 1 (Week 1): Pt will utilize compensatory strategies to maximize safety with swallowing with recommended diet with Mod cues SLP Short Term Goal 2 (Week 1): Pt will sustain attention to basic, familiar task in structured environment for 10 minutes with Max cues SLP Short Term Goal 3 (Week 1): Pt will utilize external aids and environmental cues to demonstrate orientation x4 with Max cues SLP Short Term Goal 4 (Week 1): Pt will demonstrate intellectual awareness of at least one cognitive and one physical deficit with Max cues SLP Short Term Goal 5 (Week 1): Pt will follow one-step commands with 80% accuracy throughout basic, functional tasks in structured environment with Mod cues  Skilled Therapeutic Interventions: Skilled treatment session focused on cognitive and swallowing goals. Upon arrival, patient was asleep in enclosure bed but awakened with verbal and tactile stimuli and agreeable to participate in treatment session.  SLP facilitated session by providing Max A multimodal cues for focused attention for ~15 seconds due to internal distractions due to reports of back pain, dizziness and what appeared to be reflux.  RN made aware, however, patient declined medication.  Patient also appeared restless throughout the session and ambulated around the unit with close supervision and requested "water." Patient consumed minimal nectar-thick liquids via cup without overt s/s of aspiration.  SLP also facilitated session by engaging patient in a card game, patient attempted to "teach" this clinician a new game, however, required total A to complete. Patient left in enclosure bed with all needs within reach. Continue with current plan  of care.     FIM:  Comprehension Comprehension Mode: Auditory Comprehension: 3-Understands basic 50 - 74% of the time/requires cueing 25 - 50%  of the time Expression Expression Mode: Verbal Expression: 2-Expresses basic 25 - 49% of the time/requires cueing 50 - 75% of the time. Uses single words/gestures. Social Interaction Social Interaction: 2-Interacts appropriately 25 - 49% of time - Needs frequent redirection. Problem Solving Problem Solving: 1-Solves basic less than 25% of the time - needs direction nearly all the time or does not effectively solve problems and may need a restraint for safety Memory Memory: 1-Recognizes or recalls less than 25% of the time/requires cueing greater than 75% of the time FIM - Eating Eating Activity: 5: Supervision/cues;5: Set-up assist for open containers;5: Set-up assist for cut food  Pain Pain Assessment Faces Pain Scale: Hurts little more  Therapy/Group: Individual Therapy  Loye Reininger 03/13/2014, 2:54 PM

## 2014-03-13 NOTE — Progress Notes (Signed)
[place] PHYSICAL MEDICINE & REHABILITATION     PROGRESS NOTE    Subjective/Complaints: Up with therapies. Cooperating. No distress  Objective: Vital Signs: Blood pressure 128/84, pulse 98, temperature 97.7 F (36.5 C), temperature source Axillary, resp. rate 18, weight 81.33 kg (179 lb 4.8 oz), SpO2 98 %. No results found. No results for input(s): WBC, HGB, HCT, PLT in the last 72 hours. No results for input(s): NA, K, CL, GLUCOSE, BUN, CREATININE, CALCIUM in the last 72 hours.  Invalid input(s): CO CBG (last 3)  No results for input(s): GLUCAP in the last 72 hours.  Wt Readings from Last 3 Encounters:  03/13/14 81.33 kg (179 lb 4.8 oz)  03/09/14 84.188 kg (185 lb 9.6 oz)  01/23/14 95.255 kg (210 lb)    Physical Exam:  Constitutional: He appears well-developed and well-nourished. He is sleeping. He is easily aroused. He is sedated.  In four points restraints.  HENT:  Head: Normocephalic.  Neck: supple Cardiovascular: Normal rate and regular rhythm.  Respiratory: Effort normal. No respiratory distress.  GI: Soft. Bowel sounds are normal. He exhibits no distension. There is no tenderness.  Musculoskeletal: He exhibits no edema.  Neurological: He is easily aroused.   restless.   Moves all extremities without difficulty. Left side less spontaneous with movement than right but he still moves all 4's. Senses pain in all 4. Oriented to name only.  Skin: Skin is warm and dry.  Psychiatric:  Confused, alert, fairly cooperative. Non-agitated, restless  Assessment/Plan: 1. 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 Physiatrist is providing close team supervision and 24 hour management of active medical problems listed below. Physiatrist and rehab team continue to assess barriers to discharge/monitor patient progress toward functional and medical goals. FIM: FIM -  Bathing Bathing Steps Patient Completed: Chest, Abdomen, Front perineal area Bathing: 2: Max-Patient completes 3-4 [id number] 10 parts or 25-49%  FIM - Upper Body Dressing/Undressing Upper body dressing/undressing: 0: Wears gown/pajamas-no public clothing FIM - Lower Body Dressing/Undressing Lower body dressing/undressing steps patient completed: Thread/unthread left underwear leg, Thread/unthread right underwear leg, Don/Doff left sock, Don/Doff right sock, Pull underwear up/down Lower body dressing/undressing: 4: Steadying Assist  FIM - Toileting Toileting steps completed by patient: Adjust clothing prior to toileting, Performs perineal hygiene, Adjust clothing after toileting Toileting: 4: Steadying assist  FIM - Diplomatic Services operational officerToilet Transfers Toilet Transfers Assistive Devices: Grab bars Toilet Transfers: 4-To toilet/BSC: Min A (steadying Pt. > 75%), 4-From toilet/BSC: Min A (steadying Pt. > 75%)  FIM - Bed/Chair Transfer Bed/Chair Transfer: 5: Supine > Sit: Supervision (verbal cues/safety issues), 5: Sit > Supine: Supervision (verbal cues/safety issues), 4: Chair or W/C > Bed: Min A (steadying Pt. > 75%), 4: Bed > Chair or W/C: Min A (steadying Pt. > 75%)  FIM - Locomotion: Wheelchair Locomotion: Wheelchair: 0: Activity did not occur FIM - Locomotion: Ambulation Locomotion: Ambulation Assistive Devices: Other (comment) (none) Ambulation/Gait Assistance: 4: Min guard, 5: Supervision Locomotion: Ambulation: 4: Travels 150 ft or more with minimal assistance (Pt.>75%)  Comprehension Comprehension Mode: Auditory Comprehension: 3-Understands basic 50 - 74% of the time/requires cueing 25 - 50%  of the time  Expression Expression Mode: Verbal Expression: 2-Expresses basic 25 - 49% of the time/requires cueing 50 - 75% of the time. Uses single words/gestures.  Social Interaction Social Interaction: 2-Interacts appropriately 25 - 49% of time - Needs frequent redirection.  Problem Solving Problem Solving:  1-Solves basic less than 25% of  the time - needs direction nearly all the time or does not effectively solve problems and may need a restraint for safety  Memory Memory: 1-Recognizes or recalls less than 25% of the time/requires cueing greater than 75% of the time  Medical Problem List and Plan: 1. Functional deficits secondary to TBI 2. DVT Prophylaxis/Anticoagulation: Pharmaceutical: Lovenox 3. Pain Management: Will continue oxycodone prn. Will likely need premedication as unable to express needs. Will monitor for now.  4. Mood: Unable to gauge at this time due to cognitive deficits. LCSW to follow along for evaluation and support once more appropriate.  5. Neuropsych: This patient is not capable of making decisions on her own behalf. -continue in veil for safety 6. Skin/Wound Care: Pressure relief measures. Maintain adequate hydration and nutritional status as able.  7. Fluids/Electrolytes/Nutrition: Continue nutritional supplements.   8. MSSA HCAP: Completed 10 day course of antibiotic regimen 01/17.  9. Reactive Leucocytosis: Resolving.  10. Hypokalemia: Likely due to poor nutritional status as well as dilutional . Will d/c IVF and monitor for now.  11. Agitation: Decreasing Seroquel  -consider vpa - Continue Klonopin 0.5 Mg tid.  - librium prn for agitation/anxiety.  -Monitor sleep wake cycle.  -Continue vail bed fall prevention and due to poor insight/awareness-vail soon 12 Activation:  ritalin to help with activation and attention.  LOS (Days) 4 A FACE TO FACE EVALUATION WAS PERFORMED  Maeola Mchaney T 03/13/2014 9:12 AM

## 2014-03-13 NOTE — Progress Notes (Signed)
Occupational Therapy Session Note  Patient Details  Name: Katrine CohoJeffrey L Mao MRN: [id number] Date of Birth: [birth year]  Today's Date: 03/13/2014 OT Individual Time: 0700-0800 and 1100-1200 OT Individual Time Calculation (min): 60 min and 60 min     Short Term Goals: Week 1:  OT Short Term Goal 1 (Week 1): Pt will demonstrate sustained attention to self-care task for 1 min with mod cues OT Short Term Goal 2 (Week 1): Pt will consistently be oriented to x3 with max cues OT Short Term Goal 3 (Week 1): Pt will complete self-care task in standing for 1 min with min assist for balance OT Short Term Goal 4 (Week 1): Pt will complete bathing task with min assist and mod cues for sequencing and attention  Skilled Therapeutic Interventions/Progress Updates:    Session 1: Pt seen for 1:1 OT session with focus on cognitive remediation, dynamic standing balance, and functional mobility. Pt received supine in bed oriented to person and city with min cues. Pt ambulated to bathroom at close supervision level to complete toilet task. Pt required cues for sequencing to complete hand hygiene. Pt declined bathing this AM, however agreeable to changing pants. Pt retrieve pants from low drawer then donned at supervision level. Attempted to engage in breakfast however pt taking 2 bites and declining further eating. Pt ambulated to therapy gym with CGA-SBA. Engaged in zoom ball task with focus on standing balance and sustained attention. Attempted to engaged in checkers game, however pt internally and externally distracted and unable to sustain attention to task. Ambulated back to room with emphasis on recall of room number with pt requiring more than reasonable amount of time and max cues for use of sign to assist. Pt returned to room and left supine in bed with all needs in reach. Pt perseverating on water throughout session, attempting multiple times to retrieve water from sink. Pt also continues to demonstrate  confabulation and language of confusion throughout therapy session  Session 2: Pt seen for 1:1 OT session with focus on functional mobility, orientation, cognitive remediation, and activity tolerance. Pt received supine in bed agreeable to therapy with mod cues of encouragement. Attempted to engage in orientation questions, however pt declining answering stating "I know but you don't need to." Therapist provided total A to orient with little to no carryover. Ambulated to ortho gym to limit external distractions. Completed number finding game of #'s 1-6 with max A for sustained attention up to 10 seconds and mod cues for sequencing. Pt lying down on mat table after locating each number, requiring max cues to continue activity. Upon finishing task, pt ambulated back to room with recall of room number after approx 3 min, however unable to accurately use signs to assist. Upon return to room pt returned to bed and left with all needs in reach.  Therapy Documentation Precautions:  Precautions Precautions: Fall Restrictions Weight Bearing Restrictions: No General:   Vital Signs:   Pain: Pt with report of back pain and chest pain when drinking orange juice.  See FIM for current functional status  Therapy/Group: Individual Therapy  Daneil Danerkinson, Takeyah Wieman N 03/13/2014, 11:21 AM
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
## 2014-03-14 ENCOUNTER — Inpatient Hospital Stay (HOSPITAL_COMMUNITY): Payer: Self-pay | Admitting: Speech Pathology

## 2014-03-14 NOTE — Progress Notes (Signed)
[facility] PHYSICAL MEDICINE & REHABILITATION     PROGRESS NOTE    Subjective/Complaints: Some agitation this morning after breakfast.   Objective: Vital Signs: Blood pressure 111/81, pulse 90, temperature 97.9 F (36.6 C), temperature source Oral, resp. rate 20, weight 80.9 kg (178 lb 5.6 oz), SpO2 100 %. No results found. No results for input(s): WBC, HGB, HCT, PLT in the last 72 hours. No results for input(s): NA, K, CL, GLUCOSE, BUN, CREATININE, CALCIUM in the last 72 hours.  Invalid input(s): CO CBG (last 3)  No results for input(s): GLUCAP in the last 72 hours.  Wt Readings from Last 3 Encounters:  03/14/14 80.9 kg (178 lb 5.6 oz)  03/09/14 84.188 kg (185 lb 9.6 oz)  01/23/14 95.255 kg (210 lb)    Physical Exam:  Constitutional: He appears well-developed and well-nourished. He is sleeping. He is easily aroused. He is sedated.  In four points restraints.  HENT:  Head: Normocephalic.  Neck: supple Cardiovascular: Normal rate and regular rhythm.  Respiratory: Effort normal. No respiratory distress.  GI: Soft. Bowel sounds are normal. He exhibits no distension. There is no tenderness.  Musculoskeletal: He exhibits no edema.  Neurological: He is easily aroused.   restless.   Moves all extremities without difficulty. Left side less spontaneous with movement than right but he still moves all 4's. Senses pain in all 4. Oriented to name only.  Skin: Skin is warm and dry.  Psychiatric:  Confused, alert, fairly cooperative. Non-agitated, restless  Assessment/Plan: 1. 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 Physiatrist is providing close team supervision and 24 hour management of active medical problems listed below. Physiatrist and rehab team continue to assess barriers to discharge/monitor patient progress toward functional and medical goals. FIM: FIM -  Bathing Bathing Steps Patient Completed: Chest, Abdomen, Front perineal area Bathing: 2: Max-Patient completes 3-4 90f 10 parts or 25-49%  FIM - Upper Body Dressing/Undressing Upper body dressing/undressing: 0: Wears gown/pajamas-no public clothing FIM - Lower Body Dressing/Undressing Lower body dressing/undressing steps patient completed: Thread/unthread left underwear leg, Thread/unthread right underwear leg, Don/Doff left sock, Don/Doff right sock, Pull underwear up/down Lower body dressing/undressing: 4: Steadying Assist  FIM - Toileting Toileting steps completed by patient: Adjust clothing prior to toileting, Performs perineal hygiene, Adjust clothing after toileting Toileting: 5: Supervision: Safety issues/verbal cues  FIM - Diplomatic Services operational officer Devices: Grab bars Toilet Transfers: 4-To toilet/BSC: Min A (steadying Pt. > 75%), 4-From toilet/BSC: Min A (steadying Pt. > 75%)  FIM - Bed/Chair Transfer Bed/Chair Transfer Assistive Devices: Arm rests Bed/Chair Transfer: 5: Supine > Sit: Supervision (verbal cues/safety issues), 5: Sit > Supine: Supervision (verbal cues/safety issues), 4: Bed > Chair or W/C: Min A (steadying Pt. > 75%), 4: Chair or W/C > Bed: Min A (steadying Pt. > 75%)  FIM - Locomotion: Wheelchair Locomotion: Wheelchair: 0: Activity did not occur FIM - Locomotion: Ambulation Locomotion: Ambulation Assistive Devices: Other (comment) (none) Ambulation/Gait Assistance: 4: Min guard, 5: Supervision Locomotion: Ambulation: 1: Travels less than 50 ft with minimal assistance (Pt.>75%)  Comprehension Comprehension Mode: Auditory Comprehension: 3-Understands basic 50 - 74% of the time/requires cueing 25 - 50%  of the time  Expression Expression Mode: Verbal Expression: 2-Expresses basic 25 - 49% of the time/requires cueing 50 - 75% of the time. Uses single words/gestures.  Social Interaction Social Interaction: 2-Interacts appropriately 25 - 49% of  time - Needs frequent redirection.  Problem  Solving Problem Solving: 1-Solves basic less than 25% of the time - needs direction nearly all the time or does not effectively solve problems and may need a restraint for safety  Memory Memory: 1-Recognizes or recalls less than 25% of the time/requires cueing greater than 75% of the time  Medical Problem List and Plan: 1. Functional deficits secondary to TBI 2. DVT Prophylaxis/Anticoagulation: Pharmaceutical: Lovenox 3. Pain Management: Will continue oxycodone prn. Will likely need premedication as unable to express needs. Will monitor for now.  4. Mood: Unable to gauge at this time due to cognitive deficits. LCSW to follow along for evaluation and support once more appropriate.  5. Neuropsych: This patient is not capable of making decisions on her own behalf. -continue in veil for safety 6. Skin/Wound Care: Pressure relief measures. Maintain adequate hydration and nutritional status as able.  7. Fluids/Electrolytes/Nutrition: Continue nutritional supplements.   8. MSSA HCAP: Completed 10 day course of antibiotic regimen 01/17.  9. Reactive Leucocytosis: Resolving.  10. Hypokalemia: Likely due to poor nutritional status as well as dilutional . Will d/c IVF and monitor for now.  11. Agitation: Decreasing Seroquel    - Continue Klonopin 0.5 Mg tid.  - librium prn for agitation/anxiety.  -Monitor sleep wake cycle.  -Continue vail bed fall prevention and due to poor insight/awareness-vail soon 12 Activation:  ritalin to help with activation and attention.  LOS (Days) 5 A FACE TO FACE EVALUATION WAS PERFORMED  Justin Pugh T 03/14/2014 12:17 PM

## 2014-03-14 NOTE — Progress Notes (Signed)
Speech Language Pathology Daily Session Note  Patient Details  Name: Justin Pugh MRN: [id number] Date of Birth: January 11, 1956  Today's Date: 03/14/2014 SLP Individual Time: 1100-1200 SLP Individual Time Calculation (min): 60 min  Short Term Goals: Week 1: SLP Short Term Goal 1 (Week 1): Pt will utilize compensatory strategies to maximize safety with swallowing with recommended diet with Mod cues SLP Short Term Goal 2 (Week 1): Pt will sustain attention to basic, familiar task in structured environment for 10 minutes with Max cues SLP Short Term Goal 3 (Week 1): Pt will utilize external aids and environmental cues to demonstrate orientation x4 with Max cues SLP Short Term Goal 4 (Week 1): Pt will demonstrate intellectual awareness of at least one cognitive and one physical deficit with Max cues SLP Short Term Goal 5 (Week 1): Pt will follow one-step commands with 80% accuracy throughout basic, functional tasks in structured environment with Mod cues  Skilled Therapeutic Interventions:  Pt was seen for skilled ST targeting goals for cognition and dysphagia.  Upon arrival, pt was reclined in posey bed, awake, alert, and restless.  Pt initiated request to wash his face and benefited from mod cues for sequencingof basic self care tasks due to impulsivity.  Pt was noted with improved clinical s/s of aspiration during trials of regular water via cup sips and presented with a soft delayed throat clear following <50% of trials.  SLP also facilitated the session with a basic, loosely structured scavenger hunt in which pt was given items to find around the unit.  Pt followed 1-step commands during the abovementioned task with mod-max cues; max cues for sustained attention and, subsequently, safety awareness.  Pt also benefited from frequent cues for redirection as he was not only distracted by external stimuli, but internal as well, which manifested itself in decreased topic maintenance and language of  confusion during functional conversations.  Pt continues with limited to no intellectual awareness of deficits.  Continue per current plan of care .    FIM:  Comprehension Comprehension Mode: Auditory Comprehension: 3-Understands basic 50 - 74% of the time/requires cueing 25 - 50%  of the time Expression Expression Mode: Verbal Expression: 3-Expresses basic 50 - 74% of the time/requires cueing 25 - 50% of the time. Needs to repeat parts of sentences. Social Interaction Social Interaction: 2-Interacts appropriately 25 - 49% of time - Needs frequent redirection. Problem Solving Problem Solving: 2-Solves basic 25 - 49% of the time - needs direction more than half the time to initiate, plan or complete simple activities Memory Memory: 1-Recognizes or recalls less than 25% of the time/requires cueing greater than 75% of the time FIM - Eating Eating Activity: 5: Supervision/cues  Pain Pain Assessment Pain Assessment: No/denies pain  Therapy/Group: Individual Therapy  Justin Pugh, Justin Pugh 03/14/2014, 6:51 PM

## 2014-03-15 ENCOUNTER — Inpatient Hospital Stay (HOSPITAL_COMMUNITY): Payer: Self-pay | Admitting: *Deleted

## 2014-03-15 ENCOUNTER — Inpatient Hospital Stay (HOSPITAL_COMMUNITY): Payer: Self-pay

## 2014-03-15 ENCOUNTER — Inpatient Hospital Stay (HOSPITAL_COMMUNITY): Payer: Self-pay | Admitting: Physical Therapy

## 2014-03-15 ENCOUNTER — Encounter (HOSPITAL_COMMUNITY): Payer: Self-pay

## 2014-03-15 MED ORDER — METHYLPHENIDATE HCL 5 MG PO TABS
10.0000 mg | ORAL_TABLET | Freq: Two times a day (BID) | ORAL | Status: DC
  Administered 2014-03-15 – 2014-03-18 (×7): 10 mg via ORAL
  Filled 2014-03-15 (×7): qty 2

## 2014-03-15 MED ORDER — MEGESTROL ACETATE 400 MG/10ML PO SUSP
400.0000 mg | Freq: Two times a day (BID) | ORAL | Status: DC
  Administered 2014-03-15 – 2014-03-22 (×14): 400 mg via ORAL
  Filled 2014-03-15 (×19): qty 10

## 2014-03-15 NOTE — Progress Notes (Signed)
Physical Therapy Session Note  Patient Details  Name: Justin Pugh MRN: [id number] Date of Birth: [birth year]  Today's Date: 03/15/2014 PT Individual Time: 1105-1205, 1510-1555 PT Individual Time Calculation (min): 60 min , 45 min  Short Term Goals: Week 1:  PT Short Term Goal 1 (Week 1): Patient will perform bed mobility and functional transfers with supervision. PT Short Term Goal 2 (Week 1): Patient will perform functional ambulation >150' with consistent supervision. PT Short Term Goal 3 (Week 1): Patient will negotiate flight of stairs with one handrail and supervision. PT Short Term Goal 4 (Week 1): Patient will sustain attention to therapeutic activity x30" with mod cues. PT Short Term Goal 5 (Week 1): Patient will demonstrate intellectual awareness in controlled environment with min cues. Skilled Therapeutic Interventions/Progress Updates:    Pt limited in session by decreased attention, decreased safety awareness, and impulsivity. Pt able to performed structured tasks at times in session, particularly early in session and with frequent cog rest breaks. Pt with decreased ability to attend and be redirected in pm session. Pt would continue to benefit from skilled PT services to increase functional mobility.  Therapy Documentation Precautions:  Precautions Precautions: Fall Precaution Comments: C-spine cleared by MD, no longer in C-Collar.   Restrictions Weight Bearing Restrictions: No Pain: Pain Assessment Pain Assessment: No/denies pain Mobility:  Pt performs transfers Min A with cues for safety and attention Locomotion : Ambulation Ambulation/Gait Assistance: 4: Min guard;5: Supervision 100'x5 with cues for safety and attention Other Treatments:  Tx 1: Pt performs repeated transfers x20 in session. Pt performs standing balance with dual motor tasks and dual cog tasks including checkers, ring toss, looking for objects on shelves. Pt attains quadraped and prone positions with  CGA/Min A and cues for safety x10. Behavioral management performed in session with redirection, low stim, frequent cog rests, education on safety and insight.   Tx 2: Behavioral management performed in session with redirection, low stim, frequent cog rests, education on safety and insight. Pt performs transfer training x10. Pt performs dynamic standing balance reaching to mod/max excursion, reaching to floor, carrying objects, lifting objects, performing functional tasks. Pt educated on safety in mobility, insight, and attention.     See FIM for current functional status  Therapy/Group: Individual Therapy  Christia ReadingKinney, Eliseo Withers G 03/15/2014, 11:15 AM

## 2014-03-15 NOTE — Progress Notes (Signed)
Occupational Therapy Session Note  Patient Details  Name: Justin Pugh MRN: [id number] Date of Birth: Aug 02, 1955  Today's Date: 03/15/2014 OT Individual Time: [id number] and [id number] OT Individual Time Calculation (min): 45 min and 60 min     Short Term Goals: Week 1:  OT Short Term Goal 1 (Week 1): Pt will demonstrate sustained attention to self-care task for 1 min with mod cues OT Short Term Goal 2 (Week 1): Pt will consistently be oriented to x3 with max cues OT Short Term Goal 3 (Week 1): Pt will complete self-care task in standing for 1 min with min assist for balance OT Short Term Goal 4 (Week 1): Pt will complete bathing task with min assist and mod cues for sequencing and attention  Skilled Therapeutic Interventions/Progress Updates:    Session 1: Pt seen for ADL retraining with focus on sustained attention, sequencing, balance, and orientation. Pt received supine in bed oriented to person and state only. Pt agreeable to shower this AM. Pt required max cues for sequencing to doff clothing before entering shower then required max cues for washing body parts due to perseveration on washing hair. Pt did not wash all body parts due to poor attention. Completed dressing sitting EOB with more than reasonable amount of time due to constantly lying down during activity. Pt informed he must complete task before therapist retrieved water, requiring 15 min to don pants. Pt perseverating on wanting water, attempting multiple times to retrieve water from sink (water shut off in pt's room due to pt on nectar thick liquids and consistently attempting to retrieve thin liquid). Pt demonstrating poor frustration tolerance this AM, cursing at tasks throughout and not at therapist. Pt continues to demonstrate confabulation and language of confusion.  Session 2: Pt seen for 1:1 OT session with focus on sustained attention, functional mobility, standing balance, and cognitive remediation. Pt received sitting  in w/c reporting hungry. Attempted to engage in lunch, however pt grimacing and moaning in pain following drink and bite of food. Pt leaning forward and unable to determine if pt having chest or stomach pain. Notified RN. Ambulated to therapy gym and pt assisted therapist in "fixing up gym" via retrieving items from floor at supervision level. Provided 2 different tools and asked pt to assisted therapist in "fixing door." Pt chose correct tool and finished screwing in screws with min cues for attention. Ambulated to [employer] tower and back 200+ feet with CGA-SBA. Pt required 2 long rest breaks during functional mobility. Engaged in therapeutic conversation with emphasis on orientation. Pt oriented to city, hospital, person and month with mod cues. Discussed country music with pt accurately identifying 3 singers. Pt ambulated back room and left in bed with all needs in reach. Pt lying down multiple times throughout session due to fatigue, requiring max cues participation.   Therapy Documentation Precautions:  Precautions Precautions: Fall Restrictions Weight Bearing Restrictions: No General:   Vital Signs: Therapy Vitals Temp: 97.8 F (36.6 C) Temp Source: Oral Pulse Rate: (!) 112 Resp: 19 BP: 126/76 mmHg Patient Position (if appropriate): Sitting Oxygen Therapy SpO2: 98 % O2 Device: Not Delivered Pain: Pain in stomach when taking bite or drink  See FIM for current functional status  Therapy/Group: Individual Therapy  Daneil Danerkinson, Alaynna Kerwood N 03/15/2014, 7:06 AM

## 2014-03-15 NOTE — Progress Notes (Signed)
PHYSICAL MEDICINE & REHABILITATION     PROGRESS NOTE    Subjective/Complaints: Some agitation this morning again. Generally controlled  Objective: Vital Signs: Blood pressure 126/76, pulse 112, temperature 97.8 F (36.6 C), temperature source Oral, resp. rate 19, weight 81.1 kg (178 lb 12.7 oz), SpO2 98 %. No results found. No results for input(s): WBC, HGB, HCT, PLT in the last 72 hours. No results for input(s): NA, K, CL, GLUCOSE, BUN, CREATININE, CALCIUM in the last 72 hours.  Invalid input(s): CO CBG (last 3)  No results for input(s): GLUCAP in the last 72 hours.  Wt Readings from Last 3 Encounters:  03/15/14 81.1 kg (178 lb 12.7 oz)  03/09/14 84.188 kg (185 lb 9.6 oz)  01/23/14 95.255 kg (210 lb)    Physical Exam:  Constitutional: He appears well-developed and well-nourished. He is sleeping. He is easily aroused. He is sedated.  In four points restraints.  HENT:  Head: Normocephalic.  Neck: supple Cardiovascular: Normal rate and regular rhythm.  Respiratory: Effort normal. No respiratory distress.  GI: Soft. Bowel sounds are normal. He exhibits no distension. There is no tenderness.  Musculoskeletal: He exhibits no edema.  Neurological: He is easily aroused.   restless.   Moves all extremities without difficulty. Left side less spontaneous with movement than right but he still moves all 4's. Senses pain in all 4. Oriented to name only.  Skin: Skin is warm and dry.  Psychiatric:  Confused, alert, fairly cooperative. Non-agitated, restless  Assessment/Plan: 1. 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 Physiatrist is providing close team supervision and 24 hour management of active medical problems listed below. Physiatrist and rehab team continue to assess barriers to discharge/monitor patient progress toward functional and medical goals. FIM: FIM -  Bathing Bathing Steps Patient Completed: Chest, Abdomen, Front perineal area, Right Arm, Left Arm Bathing: 3: Mod-Patient completes 5-7 51f 10 parts or 50-74% (due to poor attention)  FIM - Upper Body Dressing/Undressing Upper body dressing/undressing steps patient completed: Thread/unthread right sleeve of pullover shirt/dresss, Thread/unthread left sleeve of pullover shirt/dress, Put head through opening of pull over shirt/dress, Pull shirt over trunk Upper body dressing/undressing: 5: Set-up assist to: Obtain clothing/put away FIM - Lower Body Dressing/Undressing Lower body dressing/undressing steps patient completed: Thread/unthread right underwear leg, Thread/unthread left underwear leg, Pull underwear up/down, Thread/unthread right pants leg, Thread/unthread left pants leg, Pull pants up/down, Don/Doff right sock, Don/Doff left sock Lower body dressing/undressing: 5: Set-up assist to: Obtain clothing  FIM - Toileting Toileting steps completed by patient: Adjust clothing prior to toileting, Performs perineal hygiene, Adjust clothing after toileting Toileting: 5: Supervision: Safety issues/verbal cues  FIM - Diplomatic Services operational officer Devices: Grab bars Toilet Transfers: 4-To toilet/BSC: Min A (steadying Pt. > 75%), 4-From toilet/BSC: Min A (steadying Pt. > 75%)  FIM - Bed/Chair Transfer Bed/Chair Transfer Assistive Devices: Arm rests Bed/Chair Transfer: 5: Supine > Sit: Supervision (verbal cues/safety issues), 5: Sit > Supine: Supervision (verbal cues/safety issues), 4: Bed > Chair or W/C: Min A (steadying Pt. > 75%), 4: Chair or W/C > Bed: Min A (steadying Pt. > 75%)  FIM - Locomotion: Wheelchair Locomotion: Wheelchair: 0: Activity did not occur FIM - Locomotion: Ambulation Locomotion: Ambulation Assistive Devices: Other (comment) (none) Ambulation/Gait Assistance: 4: Min guard, 5: Supervision Locomotion: Ambulation: 1: Travels less than 50 ft with minimal assistance  (Pt.>75%)  Comprehension Comprehension Mode: Auditory Comprehension: 2-Understands basic 25 -  49% of the time/requires cueing 51 - 75% of the time  Expression Expression Mode: Verbal Expression: 3-Expresses basic 50 - 74% of the time/requires cueing 25 - 50% of the time. Needs to repeat parts of sentences.  Social Interaction Social Interaction: 2-Interacts appropriately 25 - 49% of time - Needs frequent redirection.  Problem Solving Problem Solving: 1-Solves basic less than 25% of the time - needs direction nearly all the time or does not effectively solve problems and may need a restraint for safety  Memory Memory: 1-Recognizes or recalls less than 25% of the time/requires cueing greater than 75% of the time  Medical Problem List and Plan: 1. Functional deficits secondary to TBI 2. DVT Prophylaxis/Anticoagulation: Pharmaceutical: Lovenox 3. Pain Management: Will continue oxycodone prn. Will likely need premedication as unable to express needs. Will monitor for now.  4. Mood: Unable to gauge at this time due to cognitive deficits. LCSW to follow along for evaluation and support once more appropriate.  5. Neuropsych: This patient is not capable of making decisions on her own behalf. -continue in veil for safety 6. Skin/Wound Care: Pressure relief measures. Maintain adequate hydration and nutritional status as able.  7. Fluids/Electrolytes/Nutrition: increase ritalin. Add megace---eating nothing.   8. MSSA HCAP: Completed 10 day course of antibiotic regimen 01/17.  9. Reactive Leucocytosis: Resolving.  10. Hypokalemia: Likely due to poor nutritional status as well as dilutional .    11. Agitation: Decreasing Seroquel   -remains substantially impaired from a cognitive standpoint - Continue Klonopin 0.5 Mg tid.  - librium prn for agitation/anxiety.  -sleep cycle improved -Continue vail bed fall  prevention and due to poor insight/awareness-vail soon 12 Activation:  ritalin to help with activation and attention.  LOS (Days) 6 A FACE TO FACE EVALUATION WAS PERFORMED  SWARTZ,ZACHARY T 03/15/2014 9:09 AM

## 2014-03-16 ENCOUNTER — Inpatient Hospital Stay (HOSPITAL_COMMUNITY): Payer: Medicaid Other

## 2014-03-16 ENCOUNTER — Inpatient Hospital Stay (HOSPITAL_COMMUNITY): Payer: Self-pay | Admitting: Physical Therapy

## 2014-03-16 ENCOUNTER — Inpatient Hospital Stay (HOSPITAL_COMMUNITY): Payer: Self-pay

## 2014-03-16 ENCOUNTER — Inpatient Hospital Stay (HOSPITAL_COMMUNITY): Payer: Medicaid Other | Admitting: *Deleted

## 2014-03-16 ENCOUNTER — Inpatient Hospital Stay (HOSPITAL_COMMUNITY): Payer: Medicaid Other | Admitting: Speech Pathology

## 2014-03-16 LAB — BASIC METABOLIC PANEL
Anion gap: 8 (ref 5–15)
BUN: 7 mg/dL (ref 6–23)
CHLORIDE: 101 mmol/L (ref 96–112)
CO2: 32 mmol/L (ref 19–32)
Calcium: 10.1 mg/dL (ref 8.4–10.5)
Creatinine, Ser: 1.01 mg/dL (ref 0.50–1.35)
GFR calc Af Amer: >90 mL/min (ref >90)
GFR, EST NON AFRICAN AMERICAN: 81 mL/min — AB (ref >90)
GLUCOSE: 104 mg/dL — AB (ref 70–99)
POTASSIUM: 4.1 mmol/L (ref 3.5–5.1)
SODIUM: 141 mmol/L (ref 135–145)

## 2014-03-16 LAB — CBC
HEMATOCRIT: 45 % (ref 39.0–52.0)
Hemoglobin: 15.6 g/dL (ref 13.0–17.0)
MCH: 33.6 pg (ref 26.0–34.0)
MCHC: 34.7 g/dL (ref 30.0–36.0)
MCV: 97 fL (ref 78.0–100.0)
Platelets: 339 10*3/uL (ref 150–400)
RBC: 4.64 MIL/uL (ref 4.22–5.81)
RDW: 12.3 % (ref 11.5–15.5)
WBC: 6.8 10*3/uL (ref 4.0–10.5)

## 2014-03-16 NOTE — Progress Notes (Addendum)
Physical Therapy Session Note  Patient Details  Name: Justin Pugh MRN: [id number] Date of Birth: 1956-01-02  Today's Date: 03/16/2014 PT Individual Time: 1500-1600 PT Individual Time Calculation (min): 60 min   Short Term Goals: Week 1:  PT Short Term Goal 1 (Week 1): Patient will perform bed mobility and functional transfers with supervision. PT Short Term Goal 2 (Week 1): Patient will perform functional ambulation >150' with consistent supervision. PT Short Term Goal 3 (Week 1): Patient will negotiate flight of stairs with one handrail and supervision. PT Short Term Goal 4 (Week 1): Patient will sustain attention to therapeutic activity x30" with mod cues. PT Short Term Goal 5 (Week 1): Patient will demonstrate intellectual awareness in controlled environment with min cues.  Skilled Therapeutic Interventions/Progress Updates:    Attempted to assess presence of vestibular impairments to promote pt stability with functional activities/mobility. However, difficult to discern decreased sustained attention vs abnormal oculomotor exam or impaired dynamic visual acuity. While standing on [employer] foam, pt with increased A/P sway but no overt LOB. Pt with difficulty closing eyes while standing on foam. Noted intact hip and ankle strategies with anterior/posterior LOB. Slow VOR appeared intact with functional head turns. Pt appeared to require additional head turns with horizontal saccades. Disequilibrium did not appear to affect functional mobility during this session. Recommend reassessment if pt exhibits signs/symptoms of disequilibrium in future.  Remainder of session focused on sustained attention, working memory, and problem solving. While searching for golf balls and baseballs in ortho gym, pt required max cueing to transition from searching for golf balls to searching for baseballs. Pt did sustain attention to pipe tree activity (most successful in seated) but required cueing to compare/contrast  3D model with picture and make changes as needed. Departed with pt seated in gym accompanied by primary PT in preparation for upcoming session.  Therapy Documentation Precautions:  Precautions Precautions: Fall Precaution Comments: C-spine cleared by MD, no longer in C-Collar.   Restrictions Weight Bearing Restrictions: No Pain: Pain Assessment Pain Assessment: No/denies pain Locomotion : Ambulation Ambulation/Gait Assistance: 4: Min guard;5: Supervision   See FIM for current functional status  Therapy/Group: Individual Therapy  Calvert CantorHobble, Youssef Footman A 03/16/2014, 4:08 PM

## 2014-03-16 NOTE — Progress Notes (Signed)
Occupational Therapy Session Note  Patient Details  Name: Justin Pugh MRN: [id number] Date of Birth: [birth year]  Today's Date: 03/16/2014 OT Individual Time: 1000-1100 OT Individual Time Calculation (min): 60 min    Short Term Goals: Week 1:  OT Short Term Goal 1 (Week 1): Pt will demonstrate sustained attention to self-care task for 1 min with mod cues OT Short Term Goal 2 (Week 1): Pt will consistently be oriented to x3 with max cues OT Short Term Goal 3 (Week 1): Pt will complete self-care task in standing for 1 min with min assist for balance OT Short Term Goal 4 (Week 1): Pt will complete bathing task with min assist and mod cues for sequencing and attention  Skilled Therapeutic Interventions/Progress Updates:    Pt seen for 1:1 OT session with focus on dynamic standing balance, cognitive remediation, and functional mobility. Pt received sitting EOB requiring total A for orientation. Pt demonstrating language of confusion and confabulation throughout session. Pt initially ambulating around room wit CGA-SBA for balance to "clean" due to "therapist and other cats made it a mess." Pt retrieved items from floor without LOB. Pt able to be redirected after 30 min then engaged in functional task of playing cards while sitting EOB. Pt demonstrated sustained attention to task up to 1 min with min cues. Engaged in game of war initially with pt requiring max cues to rule following. Transitioned to playing 21 with pt demonstrating improved rule following and able to complete basic math with 75% accuracy. At end of session pt left in enclosure bed.  Therapy Documentation Precautions:  Precautions Precautions: Fall Precaution Comments: C-spine cleared by MD, no longer in C-Collar.   Restrictions Weight Bearing Restrictions: No General:   Vital Signs:  Pain: Pain Assessment Pain Assessment: No/denies pain  See FIM for current functional status  Therapy/Group: Individual  Therapy  Daneil Danerkinson, Ahnesty Finfrock N 03/16/2014, 12:07 PM

## 2014-03-16 NOTE — Progress Notes (Signed)
Speech Language Pathology Daily Session Note  Patient Details  Name: Justin CohoJeffrey L Pugh MRN: [id number] Date of Birth: [birth year]  Today's Date: 03/16/2014 SLP Individual Time: 0900-1000 SLP Individual Time Calculation (min): 60 min  Short Term Goals: Week 1: SLP Short Term Goal 1 (Week 1): Pt will utilize compensatory strategies to maximize safety with swallowing with recommended diet with Mod cues SLP Short Term Goal 2 (Week 1): Pt will sustain attention to basic, familiar task in structured environment for 10 minutes with Max cues SLP Short Term Goal 3 (Week 1): Pt will utilize external aids and environmental cues to demonstrate orientation x4 with Max cues SLP Short Term Goal 4 (Week 1): Pt will demonstrate intellectual awareness of at least one cognitive and one physical deficit with Max cues SLP Short Term Goal 5 (Week 1): Pt will follow one-step commands with 80% accuracy throughout basic, functional tasks in structured environment with Mod cues  Skilled Therapeutic Interventions: Skilled treatment session focused on cognitive and dysphagia goals. Upon arrival, patient was awake in the enclosure bed and agreeable to participate in treatment session. Patient consumed limited amount of breakfast meal of Dys. 1 textures with nectar-thick liquids due to decreased sustained attention and demonstrated an intermittent wet vocal quality that required total A to self-monitor and correct with a cued throat clear. Patient also required Max A multimodal cues for sustained attention and redirection throughout the entire session due to restlessness and distractibility, which manifested itself in decreased topic maintenance and language of confusion during functional conversations with perseveration on "being in the service."  Pt continues with limited to no intellectual awareness of deficits and was only oriented to himself.  Patient handed off to OT. Continue per current plan of care.   FIM:   Comprehension Comprehension Mode: Auditory Comprehension: 3-Understands basic 50 - 74% of the time/requires cueing 25 - 50%  of the time Expression Expression Mode: Verbal Expression: 2-Expresses basic 25 - 49% of the time/requires cueing 50 - 75% of the time. Uses single words/gestures. Social Interaction Social Interaction: 2-Interacts appropriately 25 - 49% of time - Needs frequent redirection. Problem Solving Problem Solving: 1-Solves basic less than 25% of the time - needs direction nearly all the time or does not effectively solve problems and may need a restraint for safety Memory Memory: 1-Recognizes or recalls less than 25% of the time/requires cueing greater than 75% of the time FIM - Eating Eating Activity: 5: Supervision/cues;5: Set-up assist for open containers;5: Set-up assist for cut food  Pain Pain Assessment Pain Assessment: No/denies pain  Therapy/Group: Individual Therapy  Joylyn Duggin 03/16/2014, 11:58 AM

## 2014-03-16 NOTE — Progress Notes (Signed)
Physical Therapy Session Note  Patient Details  Name: Justin CohoJeffrey L Pugh MRN: [id number] Date of Birth: 05/08/1955  Today's Date: 03/16/2014 PT Individual Time: [id number] PT Individual Time Calculation (min): 25 min   Short Term Goals: Week 1:  PT Short Term Goal 1 (Week 1): Patient will perform bed mobility and functional transfers with supervision. PT Short Term Goal 2 (Week 1): Patient will perform functional ambulation >150' with consistent supervision. PT Short Term Goal 3 (Week 1): Patient will negotiate flight of stairs with one handrail and supervision. PT Short Term Goal 4 (Week 1): Patient will sustain attention to therapeutic activity x30" with mod cues. PT Short Term Goal 5 (Week 1): Patient will demonstrate intellectual awareness in controlled environment with min cues.  Skilled Therapeutic Interventions/Progress Updates:    Patient received from prior therapists, finishing pipe tree activity. Session focused on functional ambulation and transfers with emphasis on way finding and sustained attention. This therapist facilitating comparison of picture/diagram and pipe tree puzzle that patient correct, requires max-total cues for sustained attention and self-monitoring with errors. Remainder of session focused on way finding, as patient perseverative on finding room. Patient performed functional ambulation around unit >500' with max-total cues to locate other room signs to assist with finding his own. Patient passes his own room 2 times and takes several wrong turns, including ambulating down hallways he has already been down. With max-total cues, patient able to locate his room, but refuses to return to bed and therefore left reclined in recliner with seatbelt donned at RN station.  Therapy Documentation Precautions:  Precautions Precautions: Fall Precaution Comments: C-spine cleared by MD, no longer in C-Collar.   Restrictions Weight Bearing Restrictions: No Pain: Pain  Assessment Pain Assessment: No/denies pain Pain Score: 0-No pain Locomotion : Ambulation Ambulation/Gait Assistance: 5: Supervision   See FIM for current functional status  Therapy/Group: Individual Therapy  Chipper HerbBridget S Bartley Vuolo S. Cosandra Plouffe, PT, DPT 03/16/2014, 4:31 PM

## 2014-03-16 NOTE — Progress Notes (Signed)
Occupational Therapy Note  Patient Details  Name: Justin Pugh MRN: [id number] Date of Birth: [birth year]  Today's Date: 03/16/2014 OT Individual Time: 1300-1400 OT Individual Time Calculation (min): 60 min   Pt denied pain Individual Therapy  Pt resting in enclosure bed upon arrival.  Pt required tot A for orientation to place, situation, and date.  Pt had placed sock over call bell.  Pt donned socks on request but was hesitant to don shoes until he looked in room for a different pair of shoes.  Pt finally agreed to donning pair of shoes in room.  Pt amb to bathroom and sat in shower and attempted to turn on water while fully clothed.  Pt retrieved urinal and placed in a bag before stating that he was ready to take a walk.  Pt engaged in two card games but required max verbal cues to adhere to rules.  Pt required max A for redirection to tasks.  Pt sustained attention for approx 15 seconds with functional tasks.  Focus on activity tolerance, orientation, task initiation, following one step commands, attention to task, and safety awareness.  Pt returned to enclosure bed at end of session.   Lavone NeriLanier, Lourdes Kucharski [facility] 03/16/2014, 2:39 PM

## 2014-03-16 NOTE — Progress Notes (Signed)
[facility] PHYSICAL MEDICINE & REHABILITATION     PROGRESS NOTE    Subjective/Complaints: Awake at nurses' station. Confused, non-agitated.  Objective: Vital Signs: Blood pressure 148/87, pulse 110, temperature 97.9 F (36.6 C), temperature source Oral, resp. rate 18, weight 80.7 kg (177 lb 14.6 oz), SpO2 98 %. No results found.  Recent Labs  03/16/14 0659  WBC 6.8  HGB 15.6  HCT 45.0  PLT 339    Recent Labs  03/16/14 0659  NA 141  K 4.1  CL 101  GLUCOSE 104*  BUN 7  CREATININE 1.01  CALCIUM 10.1   CBG (last 3)  No results for input(s): GLUCAP in the last 72 hours.  Wt Readings from Last 3 Encounters:  03/16/14 80.7 kg (177 lb 14.6 oz)  03/09/14 84.188 kg (185 lb 9.6 oz)  01/23/14 95.255 kg (210 lb)    Physical Exam:  Constitutional: He appears well-developed and well-nourished. He is sleeping. He is easily aroused. He is sedated.  In four points restraints.  HENT:  Head: Normocephalic.  Neck: supple Cardiovascular: Normal rate and regular rhythm.  Respiratory: Effort normal. No respiratory distress.  GI: Soft. Bowel sounds are normal. He exhibits no distension. There is no tenderness.  Musculoskeletal: He exhibits no edema.  Neurological: He is easily aroused.   restless.   Moves all extremities without difficulty. Left side less spontaneous with movement than right but he still moves all 4's. Senses pain in all 4. Oriented to name only.  Skin: Skin is warm and dry.  Psychiatric:  Confused, alert, fairly cooperative. Non-agitated, restless  Assessment/Plan: 1. 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 Physiatrist is providing close team supervision and 24 hour management of active medical problems listed below. Physiatrist and rehab team continue to assess barriers to discharge/monitor patient progress toward functional and medical  goals. FIM: FIM - Bathing Bathing Steps Patient Completed: Chest, Abdomen, Front perineal area, Right Arm, Left Arm Bathing: 3: Mod-Patient completes 5-7 70f 10 parts or 50-74% (due to poor attention)  FIM - Upper Body Dressing/Undressing Upper body dressing/undressing steps patient completed: Thread/unthread right sleeve of pullover shirt/dresss, Thread/unthread left sleeve of pullover shirt/dress, Put head through opening of pull over shirt/dress, Pull shirt over trunk Upper body dressing/undressing: 5: Set-up assist to: Obtain clothing/put away FIM - Lower Body Dressing/Undressing Lower body dressing/undressing steps patient completed: Thread/unthread right underwear leg, Thread/unthread left underwear leg, Pull underwear up/down, Thread/unthread right pants leg, Thread/unthread left pants leg, Pull pants up/down, Don/Doff right sock, Don/Doff left sock Lower body dressing/undressing: 5: Set-up assist to: Obtain clothing  FIM - Toileting Toileting steps completed by patient: Adjust clothing prior to toileting, Performs perineal hygiene, Adjust clothing after toileting Toileting: 5: Supervision: Safety issues/verbal cues  FIM - Diplomatic Services operational officer Devices: Grab bars Toilet Transfers: 4-To toilet/BSC: Min A (steadying Pt. > 75%), 4-From toilet/BSC: Min A (steadying Pt. > 75%)  FIM - Bed/Chair Transfer Bed/Chair Transfer Assistive Devices: Arm rests Bed/Chair Transfer: 5: Supine > Sit: Supervision (verbal cues/safety issues), 5: Sit > Supine: Supervision (verbal cues/safety issues), 4: Bed > Chair or W/C: Min A (steadying Pt. > 75%), 4: Chair or W/C > Bed: Min A (steadying Pt. > 75%)  FIM - Locomotion: Wheelchair Locomotion: Wheelchair: 0: Activity did not occur FIM - Locomotion: Ambulation Locomotion: Ambulation Assistive Devices: Other (comment) (none) Ambulation/Gait Assistance: 4: Min guard, 5: Supervision Locomotion: Ambulation: 1: Travels less than 50 ft  with minimal assistance (Pt.>75%)  Comprehension Comprehension Mode: Auditory Comprehension: 3-Understands basic 50 - 74% of the time/requires cueing 25 - 50%  of the time  Expression Expression Mode: Verbal Expression: 2-Expresses basic 25 - 49% of the time/requires cueing 50 - 75% of the time. Uses single words/gestures.  Social Interaction Social Interaction: 2-Interacts appropriately 25 - 49% of time - Needs frequent redirection.  Problem Solving Problem Solving: 1-Solves basic less than 25% of the time - needs direction nearly all the time or does not effectively solve problems and may need a restraint for safety  Memory Memory: 1-Recognizes or recalls less than 25% of the time/requires cueing greater than 75% of the time  Medical Problem List and Plan: 1. Functional deficits secondary to TBI 2. DVT Prophylaxis/Anticoagulation: Pharmaceutical: Lovenox 3. Pain Management: Will continue oxycodone prn. Will likely need premedication as unable to express needs. Will monitor for now.  4. Mood: Unable to gauge at this time due to cognitive deficits. LCSW to follow along for evaluation and support once more appropriate.  5. Neuropsych: This patient is not capable of making decisions on her own behalf. -continue in vail for safety 6. Skin/Wound Care: Pressure relief measures. Maintain adequate hydration and nutritional status as able.  7. Fluids/Electrolytes/Nutrition: increase ritalin. Add megace---eating nothing.   8. MSSA HCAP: Completed 10 day course of antibiotic regimen 01/17.  9. Reactive Leucocytosis: Resolving.  10. Hypokalemia: Likely due to poor nutritional status as well as dilutional .    11. Agitation: Decreasing Seroquel   -remains substantially impaired from a cognitive standpoint - Continue Klonopin 0.5 Mg tid.  - librium prn for agitation/anxiety.  -sleep cycle improved -Continue  vail bed fall prevention and due to poor insight/awareness-vail soon 12 Activation:  ritalin to help with activation and attention.  LOS (Days) 7 A FACE TO FACE EVALUATION WAS PERFORMED  Justin Pugh T 03/16/2014 8:22 AM

## 2014-03-17 ENCOUNTER — Inpatient Hospital Stay (HOSPITAL_COMMUNITY): Payer: Self-pay | Admitting: *Deleted

## 2014-03-17 ENCOUNTER — Inpatient Hospital Stay (HOSPITAL_COMMUNITY): Payer: Medicaid Other | Admitting: *Deleted

## 2014-03-17 ENCOUNTER — Inpatient Hospital Stay (HOSPITAL_COMMUNITY): Payer: Medicaid Other

## 2014-03-17 ENCOUNTER — Inpatient Hospital Stay (HOSPITAL_COMMUNITY): Payer: Medicaid Other | Admitting: Speech Pathology

## 2014-03-17 ENCOUNTER — Inpatient Hospital Stay (HOSPITAL_COMMUNITY): Payer: Medicaid Other | Admitting: Physical Therapy

## 2014-03-17 MED ORDER — PRO-STAT SUGAR FREE PO LIQD
30.0000 mL | Freq: Two times a day (BID) | ORAL | Status: DC
  Administered 2014-03-19 – 2014-03-22 (×7): 30 mL via ORAL
  Filled 2014-03-17 (×14): qty 30
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
## 2014-03-17 NOTE — Progress Notes (Signed)
Physical Therapy Session Note  Patient Details  Name: Justin CohoJeffrey L [place] MRN: [id number] Date of Birth: 1955-10-19  Today's Date: 03/17/2014 PT Individual Time: 1110-1140 and 13:30-14:00  PT Individual Time Calculation (min): 30 min and 30min  Short Term Goals: Week 1:  PT Short Term Goal 1 (Week 1): Patient will perform bed mobility and functional transfers with supervision. PT Short Term Goal 2 (Week 1): Patient will perform functional ambulation >150' with consistent supervision. PT Short Term Goal 3 (Week 1): Patient will negotiate flight of stairs with one handrail and supervision. PT Short Term Goal 4 (Week 1): Patient will sustain attention to therapeutic activity x30" with mod cues. PT Short Term Goal 5 (Week 1): Patient will demonstrate intellectual awareness in controlled environment with min cues.  Skilled Therapeutic Interventions/Progress Updates:  First tx focused on cognitive remediation, functional ambulation and functional balance training.  Pt performed functional ambulation 2x150' with way finding tasks and memory for locating gym and nurses station, for which pt needed Max A cues. S level ambulation in controlled setting with brief rest break following task.   Pt engaged in sustained and selective attention tasks with Wii Sports, bowling. Pt able to sustain attention to task x5 min and x226min with cues for sequencing Wii device buttons. Pt needed max A for working memory of device buttons. Pt required supine rest break between bowling trials with encouragement to re-engage in task. Pt needed S for balance without UE support.    Pt perseverating on wanting water, provided with RN assisting at end of tx at RN station. Pt continues to verbalize inappropriate language and comments, often times out of context of situation.   Second tx focused on cognitive remediation and functional mobility training, including stairs. Pt performed at S level for all gait, mobility, and stairs.    Pt given memory task to locate tackle box that he saw this morning in gym. Pt needed 1 directional cue to find gym, and then went straight to box.   Pt performed sustained attention and problem solving task seated to assemble weights/leaders from fishign tackle box x738min.   Functional ambulation on unit, as above. Dynamic balance challenged by lifting and carrying both light-weight and heavy objects during walking tasks and tossing/kicking ball.   Pt performed stairs x2 flights with S and safety cues. Handoff to PT.          Therapy Documentation Precautions:  Precautions Precautions: Fall Precaution Comments: C-spine cleared by MD, no longer in C-Collar.   Restrictions Weight Bearing Restrictions: No    Pain: Pain Assessment Pain Assessment: No/denies pain Mobility:   Locomotion : Ambulation Ambulation/Gait Assistance: 5: Supervision   See FIM for current functional status  Therapy/Group: Individual Therapy  Clydene Lamingole Celisse Ciulla, PT, DPT  03/17/2014, 12:00 PM

## 2014-03-17 NOTE — Progress Notes (Signed)
[place] PHYSICAL MEDICINE & REHABILITATION     PROGRESS NOTE    Subjective/Complaints: Slept fairly well. In enclosure bed.  Objective: Vital Signs: Blood pressure 105/66, pulse 83, temperature 98.5 F (36.9 C), temperature source Oral, resp. rate 20, weight 81.058 kg (178 lb 11.2 oz), SpO2 99 %. No results found.  Recent Labs  03/16/14 0659  WBC 6.8  HGB 15.6  HCT 45.0  PLT 339    Recent Labs  03/16/14 0659  NA 141  K 4.1  CL 101  GLUCOSE 104*  BUN 7  CREATININE 1.01  CALCIUM 10.1   CBG (last 3)  No results for input(s): GLUCAP in the last 72 hours.  Wt Readings from Last 3 Encounters:  03/17/14 81.058 kg (178 lb 11.2 oz)  03/09/14 84.188 kg (185 lb 9.6 oz)  01/23/14 95.255 kg (210 lb)    Physical Exam:  Constitutional: He appears well-developed and well-nourished. He is sleeping. He is easily aroused. He is sedated.  In four points restraints.  HENT:  Head: Normocephalic.  Neck: supple Cardiovascular: Normal rate and regular rhythm.  Respiratory: Effort normal. No respiratory distress.  GI: Soft. Bowel sounds are normal. He exhibits no distension. There is no tenderness.  Musculoskeletal: He exhibits no edema.  Neurological: He is easily aroused.   restless.   Moves all extremities without difficulty. Left side less spontaneous with movement than right but he still moves all 4's. Senses pain in all 4. Oriented to name only. confabulates Skin: Skin is warm and dry.  Psychiatric:  Confused, alert, fairly cooperative. Non-agitated, restless  Assessment/Plan: 1. 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 Physiatrist is providing close team supervision and 24 hour management of active medical problems listed below. Physiatrist and rehab team continue to assess barriers to discharge/monitor patient progress toward functional and medical  goals. FIM: FIM - Bathing Bathing Steps Patient Completed: Chest, Abdomen, Front perineal area, Right Arm, Left Arm Bathing: 3: Mod-Patient completes 5-7 2667f 10 parts or 50-74% (due to poor attention)  FIM - Upper Body Dressing/Undressing Upper body dressing/undressing steps patient completed: Thread/unthread right sleeve of pullover shirt/dresss, Thread/unthread left sleeve of pullover shirt/dress, Put head through opening of pull over shirt/dress, Pull shirt over trunk Upper body dressing/undressing: 5: Set-up assist to: Obtain clothing/put away FIM - Lower Body Dressing/Undressing Lower body dressing/undressing steps patient completed: Thread/unthread right underwear leg, Thread/unthread left underwear leg, Pull underwear up/down, Thread/unthread right pants leg, Thread/unthread left pants leg, Pull pants up/down, Don/Doff right sock, Don/Doff left sock Lower body dressing/undressing: 5: Set-up assist to: Obtain clothing  FIM - Toileting Toileting steps completed by patient: Adjust clothing prior to toileting, Performs perineal hygiene, Adjust clothing after toileting Toileting Assistive Devices: Grab bar or rail for support Toileting: 5: Supervision: Safety issues/verbal cues  FIM - Diplomatic Services operational officerToilet Transfers Toilet Transfers Assistive Devices: Grab bars Toilet Transfers: 4-To toilet/BSC: Min A (steadying Pt. > 75%)  FIM - Bed/Chair Transfer Bed/Chair Transfer Assistive Devices: Arm rests Bed/Chair Transfer: 5: Bed > Chair or W/C: Supervision (verbal cues/safety issues), 5: Chair or W/C > Bed: Supervision (verbal cues/safety issues)  FIM - Locomotion: Wheelchair Locomotion: Wheelchair: 0: Activity did not occur FIM - Locomotion: Ambulation Locomotion: Ambulation Assistive Devices: Other (comment) (no AD) Ambulation/Gait Assistance: 5: Supervision Locomotion: Ambulation: 4: Travels 150 ft or more with minimal assistance (Pt.>75%)  Comprehension Comprehension Mode: Auditory Comprehension:  3-Understands basic 50 - 74% of the time/requires cueing 25 -  50%  of the time  Expression Expression Mode: Verbal Expression: 2-Expresses basic 25 - 49% of the time/requires cueing 50 - 75% of the time. Uses single words/gestures.  Social Interaction Social Interaction: 2-Interacts appropriately 25 - 49% of time - Needs frequent redirection.  Problem Solving Problem Solving: 1-Solves basic less than 25% of the time - needs direction nearly all the time or does not effectively solve problems and may need a restraint for safety  Memory Memory: 1-Recognizes or recalls less than 25% of the time/requires cueing greater than 75% of the time  Medical Problem List and Plan: 1. Functional deficits secondary to TBI 2. DVT Prophylaxis/Anticoagulation: Pharmaceutical: Lovenox 3. Pain Management: Will continue oxycodone prn. Will likely need premedication as unable to express needs. Will monitor for now.  4. Mood: Unable to gauge at this time due to cognitive deficits. LCSW to follow along for evaluation and support once more appropriate.  5. Neuropsych: This patient is not capable of making decisions on her own behalf. -continue in vail for safety 6. Skin/Wound Care: Pressure relief measures. Maintain adequate hydration and nutritional status as able.  7. Fluids/Electrolytes/Nutrition: increase ritalin. Add megace---eating nothing.   8. MSSA HCAP: Completed 10 day course of antibiotic regimen 01/17.  9. Reactive Leucocytosis: Resolving.  10. Hypokalemia: Likely due to poor nutritional status as well as dilutional .    11. Agitation: Decreasing Seroquel   -remains substantially impaired from a cognitive standpoint - Continue Klonopin 0.5 Mg tid.  - librium prn for agitation/anxiety.  -sleep cycle improved -Continue vail bed fall prevention and due to poor insight/awareness-vail soon 12 Activation:  ritalin to  help with activation and attention.  LOS (Days) 8 A FACE TO FACE EVALUATION WAS PERFORMED  Tazia Illescas T 03/17/2014 7:55 AM

## 2014-03-17 NOTE — Progress Notes (Signed)
Physical Therapy Session Note  Patient Details  Name: Justin Pugh MRN: [id number] Date of Birth: 1956-01-13  Today's Date: 03/17/2014 PT Individual Time: [id number] PT Individual Time Calculation (min): 53 min   Short Term Goals: Week 1:  PT Short Term Goal 1 (Week 1): Patient will perform bed mobility and functional transfers with supervision. PT Short Term Goal 2 (Week 1): Patient will perform functional ambulation >150' with consistent supervision. PT Short Term Goal 3 (Week 1): Patient will negotiate flight of stairs with one handrail and supervision. PT Short Term Goal 4 (Week 1): Patient will sustain attention to therapeutic activity x30" with mod cues. PT Short Term Goal 5 (Week 1): Patient will demonstrate intellectual awareness in controlled environment with min cues.  Skilled Therapeutic Interventions/Progress Updates:    Patient received sitting in recliner at RN station. Session focused on sustained attention with functional, physical, and cognitive tasks. Patient able to sustain attention to functional mobility tasks that have a start and end with max cues, for example, negotiating 12 stairs, performing floor transfer, pushing a set of stairs down the hallway, putting items away after use, etc. Patient is unable to be redirected to sustain attention to task when given more abstract tasks, for example, let's find ...  Patient performing at overall supervision level for functional ambulation >500', 12 stairs with one handrail ascending and two handrails descending, negotiating obstacles in confined spaces, and floor transfer. Patient continues to have limited sustained attention and is also very restless and therefore, spends a lot of session moving around depending on what he is attending to despite max-total cues for redirection. Patient able to engage in functional tasks more appropriately when manipulated in a way that is motivating to the patient, for example, asking him to clean  the floor mat in order to perform floor transfer, or asking him to clean different items up.  Patient continues to perseverate on various topics, such as smoking, drinking regular water, etc. Patient returned to room and left supine in enclosure bed with all needs within reach.  Therapy Documentation Precautions:  Precautions Precautions: Fall Precaution Comments: C-spine cleared by MD, no longer in C-Collar.   Restrictions Weight Bearing Restrictions: No Pain: Pain Assessment Pain Assessment: No/denies pain Locomotion : Ambulation Ambulation/Gait Assistance: 5: Supervision   See FIM for current functional status  Therapy/Group: Individual Therapy  Chipper HerbBridget S Vonetta Foulk S. Orville Mena, PT, DPT 03/17/2014, 4:19 PM

## 2014-03-17 NOTE — Progress Notes (Signed)
Speech Language Pathology Weekly Progress and Session Note  Patient Details  Name: Justin Pugh MRN: [id number] Date of Birth: 04-24-1955  Beginning of progress report period: March 09, 2014 End of progress report period: March 17, 2014  Today's Date: 03/17/2014 SLP Individual Time: 0800-0900 SLP Individual Time Calculation (min): 60 min  Short Term Goals: Week 1: SLP Short Term Goal 1 (Week 1): Pt will utilize compensatory strategies to maximize safety with swallowing with recommended diet with Mod cues SLP Short Term Goal 1 - Progress (Week 1): Met SLP Short Term Goal 2 (Week 1): Pt will sustain attention to basic, familiar task in structured environment for 10 minutes with Max cues SLP Short Term Goal 2 - Progress (Week 1): Not met SLP Short Term Goal 3 (Week 1): Pt will utilize external aids and environmental cues to demonstrate orientation x4 with Max cues SLP Short Term Goal 3 - Progress (Week 1): Not met SLP Short Term Goal 4 (Week 1): Pt will demonstrate intellectual awareness of at least one cognitive and one physical deficit with Max cues SLP Short Term Goal 4 - Progress (Week 1): Not met SLP Short Term Goal 5 (Week 1): Pt will follow one-step commands with 80% accuracy throughout basic, functional tasks in structured environment with Mod cues SLP Short Term Goal 5 - Progress (Week 1): Met    New Short Term Goals: Week 2: SLP Short Term Goal 1 (Week 2): Pt will utilize compensatory strategies to maximize safety with swallowing with recommended diet with Min A multimodal cues SLP Short Term Goal 2 (Week 2): Pt will sustain attention to basic, familiar task in structured environment for 5 minutes with Max cues SLP Short Term Goal 3 (Week 2): Pt will utilize external aids and environmental cues to demonstrate orientation x4 with Max cues SLP Short Term Goal 4 (Week 2): Pt will demonstrate intellectual awareness of at least one cognitive and one physical deficit with Max  cues SLP Short Term Goal 5 (Week 2): Pt will follow one-step commands with 80% accuracy throughout basic, functional tasks in structured environment with Min cues  Weekly Progress Updates: Patient has met 2 of 5 short term goals this reporting period due to limited and inconsistent gains. Currently, patient is demonstrating behaviors consistent with a Rancho Level V and requires Max-Total A multimodal cues for orientation to time, place and situation, sustained attention, intellectual awareness, working memory and functional problem solving. Patient also demonstrates language of confusion with impulsivity. Patient is currently consuming Dys. 1 textures with nectar-thick liquids with an intermittent wet vocal quality and requires overall Mod A for utilization of swallowing compensatory strategies. Patient has potential for diet upgrade once attention and ability to follow commands improves. Patient would benefit from continued skilled SLP intervention in order to maximize his cognitive and swallowing function in order to maximize his overall functional independence and reduce caregiver burden prior to discharge.    Intensity: Minumum of 1-2 x/day, 30 to 90 minutes Frequency: 5 out of 7 days Duration/Length of Stay: TBD due to SNF placement  Treatment/Interventions: Cognitive remediation/compensation;Cueing hierarchy;Dysphagia/aspiration precaution training;Environmental controls;Functional tasks;Internal/external aids;Patient/family education;Speech/Language facilitation;Therapeutic Activities   Daily Session  Skilled Therapeutic Interventions: Skilled treatment session focused on cognitive and dysphagia goals. SLP facilitated session by providing total A multimodal cues for orientation to place, time and situation and for focused attention to functional tasks like self-feeding and toileting for ~10 seconds.  Patient demonstrated increased ability to participate in a functional conversation and was  able to  maintain the topic of conversation for ~2 turns.  Patient also consumed breakfast meal of Dys. 1 textures with nectar-thick liquids with extra time needed for consumption due to decreased attention, however, patient performed tray set-up and small bites with Mod I but required Mod A multimodal cues for small sips via cup. Patient without overt s/s of aspiration throughout the meal. Patient handed off to NT for completion of full supervision with meal. Continue with current plan of care.    FIM:  Comprehension Comprehension Mode: Auditory Comprehension: 3-Understands basic 50 - 74% of the time/requires cueing 25 - 50%  of the time Expression Expression Mode: Verbal Expression: 2-Expresses basic 25 - 49% of the time/requires cueing 50 - 75% of the time. Uses single words/gestures. Social Interaction Social Interaction: 2-Interacts appropriately 25 - 49% of time - Needs frequent redirection. Problem Solving Problem Solving: 1-Solves basic less than 25% of the time - needs direction nearly all the time or does not effectively solve problems and may need a restraint for safety Memory Memory: 1-Recognizes or recalls less than 25% of the time/requires cueing greater than 75% of the time FIM - Eating Eating Activity: 5: Supervision/cues Pain Pain Assessment Pain Assessment: No/denies pain  Therapy/Group: Individual Therapy  Dacoda Finlay 03/17/2014, 3:25 PM

## 2014-03-17 NOTE — Progress Notes (Signed)
Physical Therapy Session Note  Patient Details  Name: Justin CohoJeffrey L Chiao MRN: [id number] Date of Birth: [birth year]  Today's Date: 03/17/2014 PT Individual Time: 1400-1435 PT Individual Time Calculation (min): 35 min   Short Term Goals: Week 1:  PT Short Term Goal 1 (Week 1): Patient will perform bed mobility and functional transfers with supervision. PT Short Term Goal 2 (Week 1): Patient will perform functional ambulation >150' with consistent supervision. PT Short Term Goal 3 (Week 1): Patient will negotiate flight of stairs with one handrail and supervision. PT Short Term Goal 4 (Week 1): Patient will sustain attention to therapeutic activity x30" with mod cues. PT Short Term Goal 5 (Week 1): Patient will demonstrate intellectual awareness in controlled environment with min cues.  Skilled Therapeutic Interventions/Progress Updates:   Therapy co-treat session with recreational therapy with focus on functional mobility, cognition including memory, selective and sustained attention to activity & safety awareness. Pt ambulated with in his room and throughout the rehab unit with task of locating playing cards and a location to play game of cards with supervision and intermittent contact guard assist for redirecting pt to ambulate in the direction needed. Pt verbose throughout session needing max verbal cues to attend to task. Once seated in quiet environment, pt played Rummy card game while instructing PT on rules of play with only minimal verbal cues needed.  Able to attend to card game ~10 minutes.  Therapy Documentation Precautions:  Precautions Precautions: Fall Precaution Comments: C-spine cleared by MD, no longer in C-Collar.   Restrictions Weight Bearing Restrictions: No Vital Signs: Therapy Vitals Temp: 98 F (36.7 C) Temp Source: Oral Pulse Rate: (!) 107 Resp: 18 BP: (!) 123/91 mmHg Patient Position (if appropriate): Sitting Oxygen Therapy SpO2: 100 % O2 Device: Not  Delivered Pain: Pain Assessment Pain Assessment: No/denies pain Locomotion : Ambulation Ambulation/Gait Assistance: 5: Supervision   See FIM for current functional status  Therapy/Group: Individual Therapy/co-treatment with RT  Edman CircleHall, Rachana Malesky [facility] 03/17/2014, 4:46 PM
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
## 2014-03-17 NOTE — Progress Notes (Signed)
Recreational Therapy Session Note  Patient Details  Name: Justin Pugh MRN: [id number] Date of Birth: 1955-07-06 Today's Date: 03/17/2014  Pain: no c/o Skilled Therapeutic Interventions/Progress Updates: Session focused on functional mobility, cognition, & safety awareness.  Pt ambulated throughout the rehab unit with supervision or ocassional contact guard assist for redirecting pt to ambulate in the direction needed.  Pt verbose throughout session needing mod verbal cues to attend to task.  Pt played Rummy card game seated in ADL apartment-only 1 verbal needed for rules of play. Therapy/Group: Co-Treatment Sephora Boyar 03/17/2014, 4:19 PM

## 2014-03-17 NOTE — Progress Notes (Signed)
Occupational Therapy Weekly Progress Note  Patient Details  Name: Justin Pugh MRN: [id number] Date of Birth: 1955-09-26  Beginning of progress report period: March 10, 2014 End of progress report period: March 17, 2014  Today's Date: 03/17/2014 OT Individual Time: 1000-1100 OT Individual Time Calculation (min): 60 min    Patient has met 2 of 4 short term goals.  Patient has made some progress physically, however, minimal gains cognitively this reporting period. Patient currently completes functional transfers and functional mobility at supervision level. Patient inconsistent with participation in self-care tasks due to cognitive deficits. Patient has demonstrated the ability to sustain attention to dressing for 1 min with max cues, however, limited to 15-30 seconds during other functional tasks. Patient oriented to person only, requiring total A for orientation and intellectual awareness. Patient requires max cues for sustained attention and redirection during all therapy sessions due to internal and external distractions. Patient is currently demonstrating behaviors consistent with Rancho Level V.   Patient continues to demonstrate the following deficits: decreased balance, decreased awareness, decreased safety awareness, decreased sustained attention, decreased problem solving, decreased memory, decreased activity tolerance, decreased strength  and therefore will continue to benefit from skilled OT intervention to enhance overall performance with BADLs, attention, balance, awareness, and activity tolerance.  Patient progressing toward long term goals..  Continue plan of care.  OT Short Term Goals Week 1:  OT Short Term Goal 1 (Week 1): Pt will demonstrate sustained attention to self-care task for 1 min with mod cues OT Short Term Goal 1 - Progress (Week 1): Met OT Short Term Goal 2 (Week 1): Pt will consistently be oriented to x3 with max cues OT Short Term Goal 2 - Progress (Week 1):  Not met OT Short Term Goal 3 (Week 1): Pt will complete self-care task in standing for 1 min with min assist for balance OT Short Term Goal 3 - Progress (Week 1): Met OT Short Term Goal 4 (Week 1): Pt will complete bathing task with min assist and mod cues for sequencing and attention OT Short Term Goal 4 - Progress (Week 1): Progressing toward goal Week 2:  OT Short Term Goal 1 (Week 2): Pt will wash 10/10 body parts at supervision level with max multimodal cues OT Short Term Goal 2 (Week 2): Pt will be oriented x2 with max cues  OT Short Term Goal 3 (Week 2): Pt will demonstrate sustained attention to functional task for 1 min with max cues  OT Short Term Goal 4 (Week 2): Pt will identify 1 physical or cognitive deficit with max cues  Skilled Therapeutic Interventions/Progress Updates:    Pt seen for ADL retraining with focus on attention, standing balance, functional mobility, and overall cognitive remediation. Pt received sitting in recliner chair at nurses station and agreeable to shower this AM. Pt completed bathing with mod cues for removal of all clothing before entering shower (water turned on). Pt demonstrated sustained attention for 1 min to self-care task with min cues. Pt required max cues for washing all body parts as he perseverated on washing face and arms. Pt completed dressing with total A to orient undergarments and supervision for balance. Following B&D pt ambulated around room opening drawers and retrieving items from floor at supervision level for balance. Pt attempting to drink water from urinal, requiring total A to remove. Pt required total A for orientation and redirection throughout session. Engaged in therapeutic conversation in regards to pt's hobbies with pt identifying 1 artist specific to genre  of choice. Pt left sitting in recliner at nurses station with all needs in reach.   Therapy Documentation Precautions:  Precautions Precautions: Fall Precaution Comments:  C-spine cleared by MD, no longer in C-Collar.   Restrictions Weight Bearing Restrictions: No General:   Vital Signs: Therapy Vitals Temp: 98.5 F (36.9 C) Temp Source: Oral Pulse Rate: 83 Resp: 20 BP: 105/66 mmHg Oxygen Therapy SpO2: 99 % O2 Device: Not Delivered Pain: No report of pain  See FIM for current functional status  Therapy/Group: Individual Therapy  Duayne Cal 03/17/2014, 6:55 AM

## 2014-03-18 ENCOUNTER — Encounter (HOSPITAL_COMMUNITY): Payer: Self-pay

## 2014-03-18 ENCOUNTER — Inpatient Hospital Stay (HOSPITAL_COMMUNITY): Payer: Self-pay | Admitting: *Deleted

## 2014-03-18 ENCOUNTER — Inpatient Hospital Stay (HOSPITAL_COMMUNITY): Payer: Medicaid Other

## 2014-03-18 ENCOUNTER — Inpatient Hospital Stay (HOSPITAL_COMMUNITY): Payer: Medicaid Other | Admitting: *Deleted

## 2014-03-18 ENCOUNTER — Inpatient Hospital Stay (HOSPITAL_COMMUNITY): Payer: Medicaid Other | Admitting: Speech Pathology

## 2014-03-18 MED ORDER — PANTOPRAZOLE SODIUM 40 MG PO TBEC
40.0000 mg | DELAYED_RELEASE_TABLET | Freq: Two times a day (BID) | ORAL | Status: DC
  Administered 2014-03-18 – 2014-04-07 (×37): 40 mg via ORAL
  Filled 2014-03-18 (×36): qty 1

## 2014-03-18 MED ORDER — METHYLPHENIDATE HCL 5 MG PO TABS
5.0000 mg | ORAL_TABLET | Freq: Two times a day (BID) | ORAL | Status: DC
  Administered 2014-03-19 – 2014-03-20 (×3): 5 mg via ORAL
  Filled 2014-03-18 (×3): qty 1

## 2014-03-18 NOTE — Progress Notes (Signed)
Physical Therapy Weekly Progress Note  Patient Details  Name: Justin Pugh MRN: [id number] Date of Birth: [birth year]  Beginning of progress report period: March 10, 2014 End of progress report period: March 18, 2014  Today's Date: 03/18/2014 PT Individual Time: 0900-0940 PT Individual Time Calculation (min): 40 min   Patient has met 4 of 5 short term goals. Patient has made good progress with functional mobility during first reporting period on rehab. Patient is currently functioning at overall supervision level for all mobility, including functional ambulation >500', flight of stairs with one handrail, and functional balance. Patient continues to be most limited by decreased sustained attention, disorientation, and decreased intellectual awareness. Patient is currently demonstrating behaviors consistent with Rancho Level V.  Patient continues to demonstrate the following deficits: inconsistent behavior, decreased activity tolerance, decreased ability to compensate for deficits, decreased balance and balance strategies, decreased sustained attention, decreased intellectual awareness, disorientation and therefore will continue to benefit from skilled PT intervention to enhance overall performance with activity tolerance, balance, postural control, ability to compensate for deficits, attention, awareness, coordination and knowledge of precautions.  Patient progressing toward long term goals..  Continue plan of care.  PT Short Term Goals Week 1:  PT Short Term Goal 1 (Week 1): Patient will perform bed mobility and functional transfers with supervision. PT Short Term Goal 1 - Progress (Week 1): Met PT Short Term Goal 2 (Week 1): Patient will perform functional ambulation >150' with consistent supervision. PT Short Term Goal 2 - Progress (Week 1): Met PT Short Term Goal 3 (Week 1): Patient will negotiate flight of stairs with one handrail and supervision. PT Short Term Goal 3 - Progress  (Week 1): Met PT Short Term Goal 4 (Week 1): Patient will sustain attention to therapeutic activity x30" with mod cues. PT Short Term Goal 4 - Progress (Week 1): Met PT Short Term Goal 5 (Week 1): Patient will demonstrate intellectual awareness in controlled environment with min cues. PT Short Term Goal 5 - Progress (Week 1): Progressing toward goal Week 2:  PT Short Term Goal 1 (Week 2): STGs=LTGs  Skilled Therapeutic Interventions/Progress Updates:    AM Session: Patient received supine in enclosure bed. Therapist facilitating therapeutic conversation prior to opening enclosure bed secondary to reports from nursing that patient was demonstrating agitated behaviors. Patient appearing slightly agitated, however, able to participate in therapy. Session spent with patient functionally ambulating all over unit, >1000' with supervision, perseverating on "calling his mom". However, when therapist attempting to facilitate therapeutic redirection to motivating tasks ("let's find a phone", "how can we find a phone", "there is a phone at the RN station"), patient unable to sustain attention long enough to integrate provided information for success with desire.  Despite total cues, patient unable to be redirected to any therapeutic activity and patient with increasing agitation as session progresses. Patient with increasing cursing, restlessness, etc. And appearing to require de-escalation techniques. Patient able to be coaxed back into enclosure bed on the condition that therapist would assist him in calling his mother. Patient left in enclosure bed with lights off, TV off, door shut; Treatment team and RN aware of patient status and discussion with RN to assist patient with calling his mother if he is able to de-escalate.  Therapy Documentation Precautions:  Precautions Precautions: Fall Precaution Comments: C-spine cleared by MD, no longer in C-Collar.   Restrictions Weight Bearing Restrictions:  No General: PT Amount of Missed Time (min): 20 Minutes PT Missed Treatment Reason: Increased agitation Pain: Pain  Assessment Pain Assessment: No/denies pain Pain Score: 0-No pain Locomotion : Ambulation Ambulation/Gait Assistance: 5: Supervision   See FIM for current functional status  Therapy/Group: Individual Therapy  [name]. Leilanie Rauda, PT, DPT 03/18/2014, 12:23 PM

## 2014-03-18 NOTE — Plan of Care (Signed)
Problem: RH BOWEL ELIMINATION Goal: RH STG MANAGE BOWEL WITH ASSISTANCE STG Manage Bowel with Min Assistance.  Outcome: Not Progressing LBM 1/24 despite scheduled senna 2 tabs po qhs  Problem: RH SAFETY Goal: RH STG ADHERE TO SAFETY PRECAUTIONS W/ASSISTANCE/DEVICE STG Adhere to Safety Precautions With min Assistance/Device.  Outcome: Not Progressing Enclosure bed, increasingly agitated

## 2014-03-18 NOTE — Progress Notes (Signed)
Speech Language Pathology Daily Session Note  Patient Details  Name: Katrine CohoJeffrey L Villamizar MRN: [id number] Date of Birth: 12-25-1955  Today's Date: 03/18/2014 SLP Individual Time: 1400-1455 SLP Individual Time Calculation (min): 55 min  Short Term Goals: Week 2: SLP Short Term Goal 1 (Week 2): Pt will utilize compensatory strategies to maximize safety with swallowing with recommended diet with Min A multimodal cues SLP Short Term Goal 2 (Week 2): Pt will sustain attention to basic, familiar task in structured environment for 5 minutes with Max cues SLP Short Term Goal 3 (Week 2): Pt will utilize external aids and environmental cues to demonstrate orientation x4 with Max cues SLP Short Term Goal 4 (Week 2): Pt will demonstrate intellectual awareness of at least one cognitive and one physical deficit with Max cues SLP Short Term Goal 5 (Week 2): Pt will follow one-step commands with 80% accuracy throughout basic, functional tasks in structured environment with Min cues  Skilled Therapeutic Interventions: Skilled treatment session focused on cognitive goals. SLP facilitated session by providing Max A multimodal cues for sustained attention to task for ~1 minute. Patient demonstrated increased restlessness with wandering and use of inappropriate language.  Patient was difficult to redirect and was perseverative on leaving the hospital while carrying a bag around on his shoulder. Patient's mother present and provided appropriate cues in attempt to redirect patient, however, they were unsuccessful. Patient eventually placed in enclosure bed and patient's mother educated in regards to patient's current cognitive function and goals of skilled SLP intervention at this time. She verbalized understanding. Patient left in secured enclosure bed with mother present. Continue with current plan of care.    FIM:  Comprehension Comprehension Mode: Auditory Comprehension: 1-Understands basic less than 25% of the  time/requires cueing 75% of the time Expression Expression Mode: Verbal Expression: 2-Expresses basic 25 - 49% of the time/requires cueing 50 - 75% of the time. Uses single words/gestures. Social Interaction Social Interaction: 1-Interacts appropriately less than 25% of the time. May be withdrawn or combative. Problem Solving Problem Solving: 1-Solves basic less than 25% of the time - needs direction nearly all the time or does not effectively solve problems and may need a restraint for safety Memory Memory: 1-Recognizes or recalls less than 25% of the time/requires cueing greater than 75% of the time FIM - Eating Eating Activity: 5: Supervision/cues  Pain Pain Assessment Pain Assessment: No/denies pain  Therapy/Group: Individual Therapy  Jansen Goodpasture 03/18/2014, 5:29 PM

## 2014-03-18 NOTE — Progress Notes (Signed)
Physical Therapy Note  Patient Details  Name: Justin Pugh MRN: [id number] Date of Birth: 08-30-1955 Today's Date: 03/18/2014  Patient missed 60 minutes of skilled physical therapy secondary to increased agitation and refusal to participate. Patient left in enclosure bed with lights off, tv off, and door closed to facilitate de-escalation. Will follow up as able.  Zella RicherBridget S Aamira Bischoff S. Jerrel Tiberio, PT, DPT 03/18/2014, 4:54 PM

## 2014-03-18 NOTE — Progress Notes (Signed)
[place] PHYSICAL MEDICINE & REHABILITATION     PROGRESS NOTE    Subjective/Complaints: Up and awake. Telling me about his "apartment" and all of its amenities. No distress. Denies a sore throat.  Objective: Vital Signs: Blood pressure 127/86, pulse 59, temperature 98.3 F (36.8 C), temperature source Oral, resp. rate 19, weight 82.4 kg (181 lb 10.5 oz), SpO2 98 %. No results found.  Recent Labs  03/16/14 0659  WBC 6.8  HGB 15.6  HCT 45.0  PLT 339    Recent Labs  03/16/14 0659  NA 141  K 4.1  CL 101  GLUCOSE 104*  BUN 7  CREATININE 1.01  CALCIUM 10.1   CBG (last 3)  No results for input(s): GLUCAP in the last 72 hours.  Wt Readings from Last 3 Encounters:  03/18/14 82.4 kg (181 lb 10.5 oz)  03/09/14 84.188 kg (185 lb 9.6 oz)  01/23/14 95.255 kg (210 lb)    Physical Exam:  Constitutional: He appears well-developed and well-nourished. He is sleeping. He is easily aroused. He is sedated.  In four points restraints.  HENT:  Head: Normocephalic.  Neck: supple Cardiovascular: Normal rate and regular rhythm.  Respiratory: Effort normal. No respiratory distress.  GI: Soft. Bowel sounds are normal. He exhibits no distension. There is no tenderness.  Musculoskeletal: He exhibits no edema.  Neurological: He is alert. Good phonation  restless.   Moves all extremities without difficulty. Left side less spontaneous with movement than right but he still moves all 4's. Senses pain in all 4. Oriented to name only. confabulates Skin: Skin is warm and dry.  Psychiatric:  Confused, alert, fairly cooperative. Non-agitated, restless  Assessment/Plan: 1. 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 Physiatrist is providing close team supervision and 24 hour management of active medical problems listed below. Physiatrist and rehab team continue to assess barriers to  discharge/monitor patient progress toward functional and medical goals. FIM: FIM - Bathing Bathing Steps Patient Completed: Chest, Abdomen, Front perineal area, Right Arm, Left Arm, Buttocks, Right upper leg, Left upper leg Bathing: 4: Min-Patient completes 8-9 927f 10 parts or 75+ percent  FIM - Upper Body Dressing/Undressing Upper body dressing/undressing steps patient completed: Thread/unthread right sleeve of pullover shirt/dresss, Thread/unthread left sleeve of pullover shirt/dress, Put head through opening of pull over shirt/dress, Pull shirt over trunk Upper body dressing/undressing: 5: Set-up assist to: Obtain clothing/put away FIM - Lower Body Dressing/Undressing Lower body dressing/undressing steps patient completed: Thread/unthread right underwear leg, Thread/unthread left underwear leg, Pull underwear up/down, Thread/unthread right pants leg, Thread/unthread left pants leg, Pull pants up/down, Don/Doff right sock, Don/Doff left sock, Don/Doff right shoe, Fasten/unfasten left shoe, Fasten/unfasten right shoe, Don/Doff left shoe Lower body dressing/undressing: 5: Set-up assist to: Obtain clothing  FIM - Toileting Toileting steps completed by patient: Adjust clothing prior to toileting, Performs perineal hygiene, Adjust clothing after toileting Toileting Assistive Devices: Grab bar or rail for support Toileting: 5: Supervision: Safety issues/verbal cues  FIM - Diplomatic Services operational officerToilet Transfers Toilet Transfers Assistive Devices: Therapist, musicGrab bars Toilet Transfers: 5-To toilet/BSC: Supervision (verbal cues/safety issues)  FIM - BankerBed/Chair Transfer Bed/Chair Transfer Assistive Devices: Arm rests Bed/Chair Transfer: 5: Supine > Sit: Supervision (verbal cues/safety issues), 5: Sit > Supine: Supervision (verbal cues/safety issues), 5: Bed > Chair or W/C: Supervision (verbal cues/safety issues), 5: Chair or W/C > Bed: Supervision (verbal cues/safety issues)  FIM - Locomotion: Wheelchair Locomotion: Wheelchair: 0:  Activity did not occur (patient  ambulatory) FIM - Locomotion: Ambulation Locomotion: Ambulation Assistive Devices: Other (comment) (none) Ambulation/Gait Assistance: 5: Supervision Locomotion: Ambulation: 5: Travels 150 ft or more with supervision/safety issues  Comprehension Comprehension Mode: Auditory Comprehension: 3-Understands basic 50 - 74% of the time/requires cueing 25 - 50%  of the time  Expression Expression Mode: Verbal Expression: 2-Expresses basic 25 - 49% of the time/requires cueing 50 - 75% of the time. Uses single words/gestures.  Social Interaction Social Interaction: 2-Interacts appropriately 25 - 49% of time - Needs frequent redirection.  Problem Solving Problem Solving: 1-Solves basic less than 25% of the time - needs direction nearly all the time or does not effectively solve problems and may need a restraint for safety  Memory Memory: 1-Recognizes or recalls less than 25% of the time/requires cueing greater than 75% of the time  Medical Problem List and Plan: 1. Functional deficits secondary to TBI 2. DVT Prophylaxis/Anticoagulation: Pharmaceutical: Lovenox 3. Pain Management: Will continue oxycodone prn. Will likely need premedication as unable to express needs. Will monitor for now.  4. Mood: Unable to gauge at this time due to cognitive deficits. LCSW to follow along for evaluation and support once more appropriate.  5. Neuropsych: This patient is not capable of making decisions on her own behalf. -continue in vail for safety 6. Skin/Wound Care: Pressure relief measures. Maintain adequate hydration and nutritional status as able.  7. Fluids/Electrolytes/Nutrition: increase ritalin. Add megace---eating nothing.   8. MSSA HCAP: Completed 10 day course of antibiotic regimen 01/17.  9. Reactive Leucocytosis: Resolving.  10. Hypokalemia: Likely due to poor nutritional status as well as dilutional .    11. Agitation: Decreasing Seroquel    -remains substantially impaired from a cognitive standpoint - Continue Klonopin 0.5 Mg tid.  - librium prn for agitation/anxiety.  -sleep cycle improved -Continue vail bed fall prevention and due to poor insight/awareness-vail soon 12 Activation:  ritalin to help with activation and attention.  13. ?GERD/odynophagia--added bid protonix to help with symptoms---vague at best. Having problems swallowing liquids?   LOS (Days) 9 A FACE TO FACE EVALUATION WAS PERFORMED  SWARTZ,ZACHARY T 03/18/2014 8:29 AM

## 2014-03-18 NOTE — Progress Notes (Signed)
Due to patient escalation, fixation on getting out of bed and going home, I gave patient librium 25mg  po prn.  Patient took medication and is eating supper at this time.  Will continue to monitor.  Dani Gobbleeardon, Tyshawn Ciullo J, RN

## 2014-03-18 NOTE — Progress Notes (Signed)
Occupational Therapy Session Note  Patient Details  Name: Justin Pugh MRN: [id number] Date of Birth: Apr 19, 1955  Today's Date: 03/18/2014 OT Individual Time: 1030-1040 and 1300-1330 OT Individual Time Calculation (min): 10 min and 30 min  Today's Date: 03/18/2014 OT Missed Time: 50 Minutes Missed Time Reason: Other (comment) (allowing for de-escalation)   Short Term Goals: Week 2:  OT Short Term Goal 1 (Week 2): Pt will wash 10/10 body parts at supervision level with max multimodal cues OT Short Term Goal 2 (Week 2): Pt will be oriented x2 with max cues  OT Short Term Goal 3 (Week 2): Pt will demonstrate sustained attention to functional task for 1 min with max cues  OT Short Term Goal 4 (Week 2): Pt will identify 1 physical or cognitive deficit with max cues  Skilled Therapeutic Interventions/Progress Updates:    Session 1: OT session focus on orientation, sustained attention and de-escalation. Pt initially received in enclosure bed cursing at therapist and speaking of "running" from hospital. Attempted to redirect however unsuccessful. Removed external stimuli and allowed for time to de-escalate. Entered room and pt no longer cursing at therapist. Engaged in therapeutic conversation with emphasis on intellectual awareness and orientation. Pt stating he was in "rehab" however declined being in hospital. Pt no longer seem agitated and therapist began unzipping enclosure bed. Pt then began cursing and speaking again of "running" from hospital. Re-zip enclosure bed and allowed pt for longer time to de-escalate. Will follow-up as able.   Session 2: Pt seen for 1:1 OT session with focus on sustained attention, functional mobility, and cognitive remediation. Pt received sitting in enclosure bed. Pt with no agitation noted therefore opened enclosure bed. Pt ambulated about room picking up and placing items into drawers and basins at supervision level. Pt attended to oral care and hair care for  approx 1 min with min cues. Pt perseverating on smoking a cigarette however not requesting to leave hospital. Pt using inappropriate language towards end of session, requiring max cues for redirection. Pt left in room with SLP present.   Therapy Documentation Precautions:  Precautions Precautions: Fall Precaution Comments: C-spine cleared by MD, no longer in C-Collar.   Restrictions Weight Bearing Restrictions: No General: General OT Amount of Missed Time: 50 Minutes Vital Signs:  Pain: Pain Assessment Pain Assessment: No/denies pain Pain Score: 0-No pain  See FIM for current functional status  Therapy/Group: Individual Therapy  Daneil Danerkinson, Frans Valente N 03/18/2014, 10:47 AM

## 2014-03-18 NOTE — Progress Notes (Signed)
During rounds, NT noticed patient trying to cut the sides of the enclosure bed with a broken plastic spoon.  NT instructed patient to give her the spoon and he did so.  He has been fixated on getting scissors or something sharp to claw his way out of the enclosure bed.  Re-educated patient, not sure any understanding was met.  Brita Romp, RN

## 2014-03-19 ENCOUNTER — Inpatient Hospital Stay (HOSPITAL_COMMUNITY): Payer: Medicaid Other | Admitting: Speech Pathology

## 2014-03-19 ENCOUNTER — Encounter (HOSPITAL_COMMUNITY): Payer: Self-pay

## 2014-03-19 ENCOUNTER — Inpatient Hospital Stay (HOSPITAL_COMMUNITY): Payer: Medicaid Other

## 2014-03-19 ENCOUNTER — Inpatient Hospital Stay (HOSPITAL_COMMUNITY): Payer: Medicaid Other | Admitting: Occupational Therapy

## 2014-03-19 MED ORDER — CLONAZEPAM 0.5 MG PO TABS
0.2500 mg | ORAL_TABLET | Freq: Three times a day (TID) | ORAL | Status: DC
  Administered 2014-03-19 – 2014-03-20 (×4): 0.25 mg via ORAL
  Filled 2014-03-19 (×4): qty 1

## 2014-03-19 MED ORDER — SORBITOL 70 % SOLN
30.0000 mL | Freq: Every day | Status: DC
  Administered 2014-03-20 – 2014-04-07 (×15): 30 mL via ORAL
  Filled 2014-03-19 (×22): qty 30

## 2014-03-19 NOTE — Progress Notes (Signed)
Nursing Note: Pt asleep.wbb 

## 2014-03-19 NOTE — Progress Notes (Signed)
Physical Therapy Session Note  Patient Details  Name: Justin Pugh MRN: [id number] Date of Birth: [birth year]  Today's Date: 03/19/2014 PT Individual Time: 1255-1355 PT Individual Time Calculation (min): 60 min   Short Term Goals: Week 2:  PT Short Term Goal 1 (Week 2): STGs=LTGs  Skilled Therapeutic Interventions/Progress Updates:  Patient sitting at nurses station in recliner with quick release belt. Patient very restless throughout session and would get verbally agitated when trying to redirect. Patient ambulated all over gym and unit with close supervision to occasional min assist for LOB. Patient did engage in 2 games of checkers. Patient able to attend and correctly follow the rules during the first game. Patient lost interest and began making extraneous moves during second game. Patient did set up board for the game x 2 with minimal cueing. Patient had difficulty problem solving to turn checkers so that magnet would stick to board.  Patient completed 1 design with the pipe tree without cueing. Patient was able to problem solve through and correct any errors. Patient left in recliner at nurses station with quick release belt in place.  Therapy Documentation Precautions:  Precautions Precautions: Fall Precaution Comments: C-spine cleared by MD, no longer in C-Collar.   Restrictions Weight Bearing Restrictions: No  Pain: Pain Assessment Pain Assessment: No/denies pain  Locomotion : Ambulation Ambulation/Gait Assistance: 5: Supervision   See FIM for current functional status  Therapy/Group: Individual Therapy  [name], [name] 03/19/2014, 3:42 PM

## 2014-03-19 NOTE — Progress Notes (Signed)
Nursing Note: Toileted ,then awake yelling out.wbb

## 2014-03-19 NOTE — Progress Notes (Signed)
Physical Therapy Session Note  Patient Details  Name: Justin Pugh MRN: [id number] Date of Birth: [birth year]  Today's Date: 03/19/2014 PT Individual Time: 0800-0900 PT Individual Time Calculation (min): 60 min   Short Term Goals: Week 2:  PT Short Term Goal 1 (Week 2): STGs=LTGs  Skilled Therapeutic Interventions/Progress Updates:  1:1. Pt received sleeping in vail bed with lights off, hadn't had breakfast yet but declined at this time. Session focused on functional transfers, ambulation, balance activities and cognitive remediation. All t/f supine<>sit and sit<>stand were performed at supervision level. Pt ambulated multiple bouts >200' with overall close(s) with seated rests due to fatigues and distractability. With max-total cues, patient able to sustain attention for up to 1 minute during therapeutic activities, including Wii video game, zoom ball, partner ball bouncing, boxing w/ gloves and punching bag. Improved sustained attention with group card game and counting money up to 2 minutes at a time with mod-max cues for redirection. However, pt requiring max cues for problem solving during card game.  Pt demonstrated increased inappropriate behavior and consistent confabulation when prompted with questions. No signs of agitation noted throughout this session.  Pt was returned to his room for breakfast, RN and sitter present, in recliner with quick release belt in place and all needs within reach.  Therapy Documentation Precautions:  Precautions Precautions: Fall Precaution Comments: C-spine cleared by MD, no longer in C-Collar.   Restrictions Weight Bearing Restrictions: No Pain: Pain Assessment Pain Assessment: No/denies pain Pain Score: 0-No pain  See FIM for current functional status  Therapy/Group: Individual Therapy  Justin Pugh, Justin Pugh 03/19/2014, 9:55 AM

## 2014-03-19 NOTE — Progress Notes (Signed)
Speech Language Pathology Daily Session Note  Patient Details  Name: Justin Pugh MRN: [id number] Date of Birth: 10/03/1955  Today's Date: 03/19/2014 SLP Individual Time: 0900-1000 SLP Individual Time Calculation (min): 60 min  Short Term Goals: Week 2: SLP Short Term Goal 1 (Week 2): Pt will utilize compensatory strategies to maximize safety with swallowing with recommended diet with Min A multimodal cues SLP Short Term Goal 2 (Week 2): Pt will sustain attention to basic, familiar task in structured environment for 5 minutes with Max cues SLP Short Term Goal 3 (Week 2): Pt will utilize external aids and environmental cues to demonstrate orientation x4 with Max cues SLP Short Term Goal 4 (Week 2): Pt will demonstrate intellectual awareness of at least one cognitive and one physical deficit with Max cues SLP Short Term Goal 5 (Week 2): Pt will follow one-step commands with 80% accuracy throughout basic, functional tasks in structured environment with Min cues  Skilled Therapeutic Interventions: Skilled treatment session focused on dysphagia and cognitive goals. Upon arrival, patient was sitting upright in a recliner while consuming his breakfast meal of Dys. 1 textures with nectar-thick liquids. Patient required encouragement for PO intake and did not demonstrate any overt s/s of aspiration. Patient also consumed limited trials of Dys. 2 textures and demonstrated mild oral residue that cleared with a cued liquid wash without overt s/s of aspiration. Recommend to continue current diet until more trials of upgraded textures can be assessed. Patient also participated in a basic card game and required Max A multimodal cues for recall of rules to task, sustained attention to task for ~2 minutes and for utilization of appropriate language/conversation.  Patient was able to write his name appropriately with extra time and required Mod A multimodal cues for functional problem solving with a basic money  task. Patient left in recliner with quick release belt in place and all needs within reach.    FIM:  Comprehension Comprehension Mode: Auditory Comprehension: 1-Understands basic less than 25% of the time/requires cueing 75% of the time Expression Expression Mode: Verbal Expression: 2-Expresses basic 25 - 49% of the time/requires cueing 50 - 75% of the time. Uses single words/gestures. Social Interaction Social Interaction: 1-Interacts appropriately less than 25% of the time. May be withdrawn or combative. Problem Solving Problem Solving: 1-Solves basic less than 25% of the time - needs direction nearly all the time or does not effectively solve problems and may need a restraint for safety Memory Memory: 1-Recognizes or recalls less than 25% of the time/requires cueing greater than 75% of the time FIM - Eating Eating Activity: 5: Supervision/cues  Pain Pain Assessment Pain Assessment: No/denies pain Pain Score: 0-No pain  Therapy/Group: Individual Therapy  Tawonda Legaspi 03/19/2014, 12:20 PM

## 2014-03-19 NOTE — Progress Notes (Signed)
Occupational Therapy Session Note  Patient Details  Name: Justin Pugh MRN: [id number] Date of Birth: [birth year]  Today's Date: 03/19/2014 OT Individual Time: 1400-1430 OT Individual Time Calculation (min): 30 min    Short Term Goals: Week 2:  OT Short Term Goal 1 (Week 2): Pt will wash 10/10 body parts at supervision level with max multimodal cues OT Short Term Goal 2 (Week 2): Pt will be oriented x2 with max cues  OT Short Term Goal 3 (Week 2): Pt will demonstrate sustained attention to functional task for 1 min with max cues  OT Short Term Goal 4 (Week 2): Pt will identify 1 physical or cognitive deficit with max cues  Skilled Therapeutic Interventions/Progress Updates:    Engaged in treatment session with focus on activity tolerance and sustained attention to pt selected task of shaving.  Pt required max verbal cues to remain on task during self-selected task.  Pt required max multimodal cues for redirection as pt ambulating around room packing a bag, reporting people keep taking thing from him. Pt did not complete shaving task, leaving throat untouched but reporting it was "good enough" and he has tomorrow. Pt oriented to self and place this session, however continues to demonstrate inappropriateness and confabulation during session.    Therapy Documentation Precautions:  Precautions Precautions: Fall Precaution Comments: C-spine cleared by MD, no longer in C-Collar.   Restrictions Weight Bearing Restrictions: No General:   Vital Signs: Therapy Vitals Temp: 97.8 F (36.6 C) Temp Source: Oral Pulse Rate: (!) 104 Resp: 18 BP: (!) 157/77 mmHg Patient Position (if appropriate): Sitting Oxygen Therapy SpO2: 99 % O2 Device: Not Delivered Pain: Pain Assessment Pain Assessment: No/denies pain  See FIM for current functional status  Therapy/Group: Individual Therapy  Rosalio LoudHOXIE, Daquawn Seelman 03/19/2014, 3:33 PM

## 2014-03-19 NOTE — Progress Notes (Signed)
Patient has become more agitated since about 1530 despite attempts to calm patient down.  Fixated on getting out of enclosure bed and leaving hospital.  Called patients mother to let her know about his status and see is family presence would help calm patient, but she is unavailable to come at this time.  Will continue to monitor.  Dani Gobbleeardon, Buena Boehm J, RN

## 2014-03-19 NOTE — Progress Notes (Signed)
Nursing Note: Pt remains in bed,an enclosure bed.Pt is very talkative,constant chatter,confused and still focused on getting OOB so that he can leave.Will give scheduled meds.wbb

## 2014-03-19 NOTE — Progress Notes (Signed)
Occupational Therapy Session Note  Patient Details  Name: Justin Pugh MRN: [id number] Date of Birth: 1955/11/12  Today's Date: 03/19/2014 OT Individual Time: 1100-1200 OT Individual Time Calculation (min): 60 min    Short Term Goals: Week 2:  OT Short Term Goal 1 (Week 2): Pt will wash 10/10 body parts at supervision level with max multimodal cues OT Short Term Goal 2 (Week 2): Pt will be oriented x2 with max cues  OT Short Term Goal 3 (Week 2): Pt will demonstrate sustained attention to functional task for 1 min with max cues  OT Short Term Goal 4 (Week 2): Pt will identify 1 physical or cognitive deficit with max cues  Skilled Therapeutic Interventions/Progress Updates:    Pt initially engaged in BADL retraining including bathing at shower level (standing) and dressing with sit<>stand from EOB.  Pt gathered clothing prior to entering bathroom for shower.  Pt washed hair X 4 but refused to wash other body parts.  Pt required max verbal cues for redirection to tasks while in room for self care tasks.  Pt amb to therapy gym and engaged in assembling structures with PVC piping.  Pt was able to assemble simpler structure from diagram but demonstrated difficulty with more complex structure, primarily because patient refused to disassemble first structure.  Pt became frustrated with task and amb back to day room requesting water.  Pt engaged in tangential conversation with max verbal cues for topic maintenance.  Pt required tot A for orientation.    Therapy Documentation Precautions:  Precautions Precautions: Fall Precaution Comments: C-spine cleared by MD, no longer in C-Collar.   Restrictions Weight Bearing Restrictions: No   Pain: Pain Assessment Pain Assessment: No/denies pain Pain Score: 0-No pain  See FIM for current functional status  Therapy/Group: Individual Therapy  Rich BraveLanier, Chasty Randal Chappell 03/19/2014, 12:12 PM

## 2014-03-19 NOTE — Progress Notes (Addendum)
[facility]ICAL [facility]ICINE & REHABILITATION     PROGRESS NOTE    Subjective/Complaints: Lying in bed. No new issues. Confused per baseline. Still impulsive ROS limited due to cognition  Objective: Vital Signs: Blood pressure 133/89, pulse 99, temperature 99.1 F (37.3 C), temperature source Oral, resp. rate 18, weight 82.4 kg (181 lb 10.5 oz), SpO2 99 %. No results found. No results for input(s): WBC, HGB, HCT, PLT in the last 72 hours. No results for input(s): NA, K, CL, GLUCOSE, BUN, CREATININE, CALCIUM in the last 72 hours.  Invalid input(s): CO CBG (last 3)  No results for input(s): GLUCAP in the last 72 hours.  Wt Readings from Last 3 Encounters:  03/18/14 82.4 kg (181 lb 10.5 oz)  03/09/14 84.188 kg (185 lb 9.6 oz)  01/23/14 95.255 kg (210 lb)    Physical Exam:  Constitutional: He appears well-developed and well-nourished. He is sleeping. He is easily aroused.  HENT:  Head: Normocephalic.  Neck: supple Cardiovascular: Normal rate and regular rhythm.  Respiratory: Effort normal. No respiratory distress.  GI: Soft. Bowel sounds are normal. He exhibits no distension. There is no tenderness.  Musculoskeletal: He exhibits no edema.  Neurological: He is alert. Good phonation  restless.   Moves all extremities without difficulty. Left side less spontaneous with movement than right but he still moves all 4's. Senses pain in all 4. Oriented to name only. confabulates Skin: Skin is warm and dry.  Psychiatric:  Confused, alert, fairly cooperative. Non-agitated, restless  Assessment/Plan: 1. 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 Physiatrist is providing close team supervision and 24 hour management of active medical problems listed below. Physiatrist and rehab team continue to assess barriers to discharge/monitor patient progress toward functional and medical  goals. FIM: FIM - Bathing Bathing Steps Patient Completed: Chest, Abdomen, Front perineal area, Right Arm, Left Arm, Buttocks, Right upper leg, Left upper leg Bathing: 4: Min-Patient completes 8-9 63f 10 parts or 75+ percent  FIM - Upper Body Dressing/Undressing Upper body dressing/undressing steps patient completed: Thread/unthread right sleeve of pullover shirt/dresss, Thread/unthread left sleeve of pullover shirt/dress, Put head through opening of pull over shirt/dress, Pull shirt over trunk Upper body dressing/undressing: 5: Set-up assist to: Obtain clothing/put away FIM - Lower Body Dressing/Undressing Lower body dressing/undressing steps patient completed: Thread/unthread right underwear leg, Thread/unthread left underwear leg, Pull underwear up/down, Thread/unthread right pants leg, Thread/unthread left pants leg, Pull pants up/down, Don/Doff right sock, Don/Doff left sock, Don/Doff right shoe, Fasten/unfasten left shoe, Fasten/unfasten right shoe, Don/Doff left shoe Lower body dressing/undressing: 5: Set-up assist to: Obtain clothing  FIM - Toileting Toileting steps completed by patient: Adjust clothing prior to toileting, Performs perineal hygiene, Adjust clothing after toileting Toileting Assistive Devices: Grab bar or rail for support Toileting: 5: Supervision: Safety issues/verbal cues  FIM - Diplomatic Services operational officer Devices: Therapist, music Transfers: 5-To toilet/BSC: Supervision (verbal cues/safety issues)  FIM - Banker Devices: Arm rests Bed/Chair Transfer: 5: Supine > Sit: Supervision (verbal cues/safety issues), 5: Sit > Supine: Supervision (verbal cues/safety issues), 5: Bed > Chair or W/C: Supervision (verbal cues/safety issues), 5: Chair or W/C > Bed: Supervision (verbal cues/safety issues)  FIM - Locomotion: Wheelchair Locomotion: Wheelchair: 0: Activity did not occur (patient ambulatory) FIM - Locomotion:  Ambulation Locomotion: Ambulation Assistive Devices: Other (comment) (none) Ambulation/Gait Assistance: 5: Supervision Locomotion: Ambulation: 5: Travels 150 ft or more with supervision/safety issues  Comprehension Comprehension Mode: Auditory Comprehension: 1-Understands basic less than 25% of the time/requires cueing 75% of the time  Expression Expression Mode: Verbal Expression: 2-Expresses basic 25 - 49% of the time/requires cueing 50 - 75% of the time. Uses single words/gestures.  Social Interaction Social Interaction: 1-Interacts appropriately less than 25% of the time. May be withdrawn or combative.  Problem Solving Problem Solving: 1-Solves basic less than 25% of the time - needs direction nearly all the time or does not effectively solve problems and may need a restraint for safety  Memory Memory: 1-Recognizes or recalls less than 25% of the time/requires cueing greater than 75% of the time  Medical Problem List and Plan: 1. Functional deficits secondary to TBI 2. DVT Prophylaxis/Anticoagulation: Pharmaceutical: Lovenox 3. Pain Management: Will continue oxycodone prn. Will likely need premedication as unable to express needs. Will monitor for now.  4. Mood: Unable to gauge at this time due to cognitive deficits. LCSW to follow along for evaluation and support once more appropriate.  5. Neuropsych: This patient is not capable of making decisions on her own behalf. -continue in vail for safety 6. Skin/Wound Care: Pressure relief measures. Maintain adequate hydration and nutritional status as able.  7. Fluids/Electrolytes/Nutrition: increase ritalin. Added megace---looks like PO is picking up 8. MSSA HCAP: Completed 10 day course of antibiotic regimen 01/17.  9. Reactive Leucocytosis: Resolving.  10. Hypokalemia: Likely due to poor nutritional status as well as dilutional .    11. Agitation: Decreasing Seroquel   -remains substantially  impaired from a cognitive standpoint - Continue Klonopin 0.5 Mg tid.  - librium prn for agitation/anxiety.  -sleep cycle improved -Continue vail bed for fall prevention and due to poor insight/awareness-vail soon 12 Activation:  ritalin to help with activation and attention.  13. ?GERD/odynophagia--added bid protonix to help with symptoms---vague at best. Having problems swallowing liquids?   LOS (Days) 10 A FACE TO FACE EVALUATION WAS PERFORMED  SWARTZ,ZACHARY T 03/19/2014 8:02 AM
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
## 2014-03-19 NOTE — Progress Notes (Signed)
Recreational Therapy Session Note  Patient Details  Name: Justin Pugh MRN: [id number] Date of Birth: 1955/03/19 Today's Date: 03/19/2014  Pain: no c/o Skilled Therapeutic Interventions/Progress Updates: Session focused on sustained attention, problem solving & appropriate conversation during tabletop card game.  Pt required max cues for task completion.  Therapy/Group: Co-Treatment   Murlene Revell 03/19/2014, 11:44 AM

## 2014-03-19 NOTE — Plan of Care (Signed)
Problem: RH BOWEL ELIMINATION Goal: RH STG MANAGE BOWEL WITH ASSISTANCE STG Manage Bowel with Min Assistance.  Outcome: Not Progressing LBM 1/24 despite 2 senna at bedtime

## 2014-03-20 ENCOUNTER — Inpatient Hospital Stay (HOSPITAL_COMMUNITY): Payer: Medicaid Other | Admitting: Speech Pathology

## 2014-03-20 ENCOUNTER — Inpatient Hospital Stay (HOSPITAL_COMMUNITY): Payer: Medicaid Other | Admitting: *Deleted

## 2014-03-20 ENCOUNTER — Encounter (HOSPITAL_COMMUNITY): Payer: Self-pay

## 2014-03-20 ENCOUNTER — Inpatient Hospital Stay (HOSPITAL_COMMUNITY): Payer: Medicaid Other

## 2014-03-20 MED ORDER — QUETIAPINE FUMARATE 50 MG PO TABS
50.0000 mg | ORAL_TABLET | Freq: Four times a day (QID) | ORAL | Status: DC | PRN
  Administered 2014-03-21 – 2014-04-05 (×5): 50 mg via ORAL
  Filled 2014-03-20 (×9): qty 1

## 2014-03-20 MED ORDER — CLONAZEPAM 0.5 MG PO TABS
0.5000 mg | ORAL_TABLET | Freq: Three times a day (TID) | ORAL | Status: DC
  Administered 2014-03-21 – 2014-03-30 (×28): 0.5 mg via ORAL
  Filled 2014-03-20 (×30): qty 1

## 2014-03-20 MED ORDER — CHLORDIAZEPOXIDE HCL 5 MG PO CAPS
10.0000 mg | ORAL_CAPSULE | Freq: Three times a day (TID) | ORAL | Status: DC
  Administered 2014-03-20: 10 mg via ORAL
  Filled 2014-03-20: qty 2

## 2014-03-20 MED ORDER — QUETIAPINE FUMARATE 100 MG PO TABS
100.0000 mg | ORAL_TABLET | Freq: Three times a day (TID) | ORAL | Status: DC
  Administered 2014-03-20 – 2014-03-27 (×22): 100 mg via ORAL
  Filled 2014-03-20 (×24): qty 1

## 2014-03-20 MED ORDER — NICOTINE 21 MG/24HR TD PT24
21.0000 mg | MEDICATED_PATCH | Freq: Every day | TRANSDERMAL | Status: DC
  Administered 2014-03-20 – 2014-05-22 (×63): 21 mg via TRANSDERMAL
  Filled 2014-03-20 (×70): qty 1

## 2014-03-20 MED ORDER — HALOPERIDOL LACTATE 5 MG/ML IJ SOLN
2.0000 mg | Freq: Four times a day (QID) | INTRAMUSCULAR | Status: DC | PRN
  Administered 2014-03-21 – 2014-03-29 (×11): 2 mg via INTRAMUSCULAR
  Filled 2014-03-20 (×16): qty 1

## 2014-03-20 MED ORDER — CHLORDIAZEPOXIDE HCL 10 MG PO CAPS: 10.0000 mg | ORAL_CAPSULE | Freq: Three times a day (TID) | ORAL | Status: DC

## 2014-03-20 MED ORDER — HALOPERIDOL 2 MG PO TABS
2.0000 mg | ORAL_TABLET | Freq: Three times a day (TID) | ORAL | Status: DC | PRN
  Filled 2014-03-20 (×2): qty 1

## 2014-03-20 MED ORDER — QUETIAPINE FUMARATE 50 MG PO TABS
50.0000 mg | ORAL_TABLET | Freq: Four times a day (QID) | ORAL | Status: DC | PRN
  Filled 2014-03-20: qty 1

## 2014-03-20 MED ORDER — CHLORDIAZEPOXIDE HCL 25 MG PO CAPS
25.0000 mg | ORAL_CAPSULE | Freq: Three times a day (TID) | ORAL | Status: DC
  Administered 2014-03-20 – 2014-04-07 (×47): 25 mg via ORAL
  Filled 2014-03-20 (×51): qty 1

## 2014-03-20 NOTE — Progress Notes (Signed)
Occupational Therapy Note  Patient Details  Name: Katrine CohoJeffrey L Pricer MRN: [id number] Date of Birth: 06-10-1955  Today's Date: 03/20/2014 OT Individual Time: 1300-1400 OT Individual Time Calculation (min): 60 min   Pt denied pain Individual Therapy  focus on self feeding, toileting, grooming, orientation, attention to task, and task initiation.  Pt perseverated on smoking cigarettes and attemtpted to enter other patient's room X 5 during session.  Pt redirected with max verbal cues; no physical assist required.  Pt used toilet X 2 and required min verbal cues to wash hands.  Pt was hyperverbal throughout session, discussing nonsensical topics that were very tangential.  Pt's mother entered room near the end of the session.  Although patient recognized his mom he was unable to recall where she lived or where she works. PT entered room and relieved this therapist.   Rich BraveLanier, Navraj Dreibelbis Chappell 03/20/2014, 2:21 PM

## 2014-03-20 NOTE — Progress Notes (Signed)
Physical Therapy Session Note  Patient Details  Name: Justin Pugh MRN: [id number] Date of Birth: 02/07/1956  Today's Date: 03/20/2014 PT Individual Time: 9169-4503 PT Individual Time Calculation (min): 15 min   Short Term Goals: Week 1:  PT Short Term Goal 1 (Week 1): Patient will perform bed mobility and functional transfers with supervision. PT Short Term Goal 1 - Progress (Week 1): Met PT Short Term Goal 2 (Week 1): Patient will perform functional ambulation >150' with consistent supervision. PT Short Term Goal 2 - Progress (Week 1): Met PT Short Term Goal 3 (Week 1): Patient will negotiate flight of stairs with one handrail and supervision. PT Short Term Goal 3 - Progress (Week 1): Met PT Short Term Goal 4 (Week 1): Patient will sustain attention to therapeutic activity x30" with mod cues. PT Short Term Goal 4 - Progress (Week 1): Met PT Short Term Goal 5 (Week 1): Patient will demonstrate intellectual awareness in controlled environment with min cues. PT Short Term Goal 5 - Progress (Week 1): Progressing toward goal  Skilled Therapeutic Interventions/Progress Updates:  1:1. Pt received in posey bed, left in bed with enclosure secured due to elopement risk and varying levels of agitation reported throughout day. Focus this session on therapeutic conversation with emphasis on safety awareness, orientation, short and long term memory. Pt req total cues for safety awareness and orientation with max cues for sustained attention to therapeutic conversation due to confabulation and tangential expression. Pt with brief episodes of improved sustained attention and accurate recall of mother and sister-in-law visiting earlier in day.   Pt with increasing restlessness and inability to re-engage in therapeutic conversation, missed 53mn at end of session. Pt left in posey bed.   Therapy Documentation Precautions:  Precautions Precautions: Fall Precaution Comments: Elopement  risk Restrictions Weight Bearing Restrictions: No (Simultaneous filing. User may not have seen previous data.) General: PT Amount of Missed Time (min): 15 Minutes PT Missed Treatment Reason: Other (Comment) (restlessness, inability ot redirect)  See FIM for current functional status  Therapy/Group: Individual Therapy  KGilmore Laroche1/29/2016, 6:26 PM

## 2014-03-20 NOTE — Plan of Care (Signed)
Problem: RH BOWEL ELIMINATION Goal: RH STG MANAGE BOWEL WITH ASSISTANCE STG Manage Bowel with Min Assistance.  Outcome: Not Progressing No BM despite giving dose sorbitol this morning  Problem: RH SAFETY Goal: RH STG ADHERE TO SAFETY PRECAUTIONS W/ASSISTANCE/DEVICE STG Adhere to Safety Precautions With min Assistance/Device.  Outcome: Not Progressing Increasingly agitated- requiring max assist to keep patient safe

## 2014-03-20 NOTE — Progress Notes (Signed)
Physical Therapy Session Note  Patient Details  Name: Justin Pugh MRN: [id number] Date of Birth: 03/03/1955  Today's Date: 03/20/2014 PT Individual Time: [phone] and 1400-1500 PT Individual Time Calculation (min): 33 min and 60 min  Short Term Goals: Week 2:  PT Short Term Goal 1 (Week 2): STGs=LTGs  Skilled Therapeutic Interventions/Progress Updates:    AM Session: Patient received ambulating around room. Patient packing bag and perseverating on leaving. Session focused on safety awareness due to patient's increased verbal agitation and perseveration on leaving hospital and therefore, increasing elopement risk. Patient ambulating around in room, gathering items in bag, and ambulating around unit before leaving unit. Therapist accompanying for safety and attempted redirection. Despite total cues, patient unable to be redirected and various attempts made to have patient decide on his own to return to unit. Patient continues to pace at exit of hospital and this therapist able to cue so that patient did not leave. Due to increased risk for elopement, therapist called unit for additional support. RN, [name], able to redirect patient back to unit and then his room, where he was left supine in enclosure bed.  PM Session: Patient received sitting in room, having conversation with mom and sister-in-law. Session focused on therapeutic conversation with emphasis on safety awareness, orientation, short and long term memory, and decreasing elopement risk. Therapist required to stand in front of door as barrier to prevent patient from leaving room due to high risk for elopement. Patient with increasing verbal agitation as session progresses (slamming doors, throwing newspaper, hitting walls, etc.). Patient able to be coaxed into bed with promise that "[name] is coming to see you". Patient left supine in enclosure bed with lights off in room to facilitate de-escalation.  Therapy Documentation Precautions:   Precautions Precautions: Fall Precaution Comments: Elopement risk Restrictions Weight Bearing Restrictions: No General: PT Amount of Missed Time (min): 27 Minutes (AM session) PT Missed Treatment Reason: Increased agitation;Other (Comment) (elopement risk) Vital Signs: Pain: Pain Assessment Pain Assessment: No/denies pain Pain Score: 0-No pain Locomotion : Ambulation Ambulation/Gait Assistance: 5: Supervision   See FIM for current functional status  Therapy/Group: Individual Therapy  Chipper HerbBridget S Lorey Pallett S. Masiyah Jorstad, PT, DPT 03/20/2014, 9:40 AM

## 2014-03-20 NOTE — Progress Notes (Signed)
[facility] PHYSICAL MEDICINE & REHABILITATION     PROGRESS NOTE    Subjective/Complaints: More agitated as a whole. Slept last night but up early.  ROS limited due to cognition  Objective: Vital Signs: Blood pressure 135/88, pulse 92, temperature 98.3 F (36.8 C), temperature source Oral, resp. rate 18, weight 82.4 kg (181 lb 10.5 oz), SpO2 99 %. No results found. No results for input(s): WBC, HGB, HCT, PLT in the last 72 hours. No results for input(s): NA, K, CL, GLUCOSE, BUN, CREATININE, CALCIUM in the last 72 hours.  Invalid input(s): CO CBG (last 3)  No results for input(s): GLUCAP in the last 72 hours.  Wt Readings from Last 3 Encounters:  03/18/14 82.4 kg (181 lb 10.5 oz)  03/09/14 84.188 kg (185 lb 9.6 oz)  01/23/14 95.255 kg (210 lb)    Physical Exam:  Constitutional: He appears well-developed and well-nourished. He is sleeping. He is easily aroused.  HENT:  Head: Normocephalic.  Neck: supple Cardiovascular: Normal rate and regular rhythm.  Respiratory: Effort normal. No respiratory distress.  GI: Soft. Bowel sounds are normal. He exhibits no distension. There is no tenderness.  Musculoskeletal: He exhibits no edema.  Neurological: He is alert. Good phonation  restless.   Moves all extremities without difficulty. Left side less spontaneous with movement than right but he still moves all 4's. Senses pain in all 4. Oriented to name only. confabulates Skin: Skin is warm and dry.  Psychiatric:  Confused, alert, fairly cooperative. Non-agitated, restless  Assessment/Plan: 1. 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 Physiatrist is providing close team supervision and 24 hour management of active medical problems listed below. Physiatrist and rehab team continue to assess barriers to discharge/monitor patient progress toward functional and medical  goals. FIM: FIM - Bathing Bathing Steps Patient Completed: Chest, Abdomen, Front perineal area, Right Arm, Left Arm, Buttocks, Right upper leg, Left upper leg Bathing: 4: Min-Patient completes 8-9 [id number] 10 parts or 75+ percent  FIM - Upper Body Dressing/Undressing Upper body dressing/undressing steps patient completed: Thread/unthread right sleeve of pullover shirt/dresss, Thread/unthread left sleeve of pullover shirt/dress, Put head through opening of pull over shirt/dress, Pull shirt over trunk Upper body dressing/undressing: 5: Supervision: Safety issues/verbal cues FIM - Lower Body Dressing/Undressing Lower body dressing/undressing steps patient completed: Thread/unthread right underwear leg, Thread/unthread left underwear leg, Pull underwear up/down, Thread/unthread right pants leg, Thread/unthread left pants leg, Pull pants up/down, Don/Doff right sock, Don/Doff left sock, Don/Doff right shoe, Fasten/unfasten left shoe, Fasten/unfasten right shoe, Don/Doff left shoe Lower body dressing/undressing: 5: Supervision: Safety issues/verbal cues  FIM - Toileting Toileting steps completed by patient: Adjust clothing prior to toileting, Performs perineal hygiene, Adjust clothing after toileting Toileting Assistive Devices: Grab bar or rail for support Toileting: 5: Supervision: Safety issues/verbal cues  FIM - Diplomatic Services operational officerToilet Transfers Toilet Transfers Assistive Devices: Therapist, musicGrab bars Toilet Transfers: 5-To toilet/BSC: Supervision (verbal cues/safety issues)  FIM - BankerBed/Chair Transfer Bed/Chair Transfer Assistive Devices: Arm rests Bed/Chair Transfer: 5: Supine > Sit: Supervision (verbal cues/safety issues), 5: Sit > Supine: Supervision (verbal cues/safety issues), 5: Bed > Chair or W/C: Supervision (verbal cues/safety issues), 5: Chair or W/C > Bed: Supervision (verbal cues/safety issues)  FIM - Locomotion: Wheelchair Locomotion: Wheelchair: 0: Activity did not occur FIM - Locomotion:  Ambulation Locomotion: Ambulation Assistive Devices: Other (comment) (none) Ambulation/Gait Assistance: 5: Supervision Locomotion: Ambulation: 5: Travels 150 ft or more with supervision/safety issues  Comprehension Comprehension Mode:  Auditory Comprehension: 2-Understands basic 25 - 49% of the time/requires cueing 51 - 75% of the time  Expression Expression Mode: Verbal Expression: 2-Expresses basic 25 - 49% of the time/requires cueing 50 - 75% of the time. Uses single words/gestures.  Social Interaction Social Interaction: 1-Interacts appropriately less than 25% of the time. May be withdrawn or combative.  Problem Solving Problem Solving: 2-Solves basic 25 - 49% of the time - needs direction more than half the time to initiate, plan or complete simple activities  Memory Memory: 1-Recognizes or recalls less than 25% of the time/requires cueing greater than 75% of the time  Medical Problem List and Plan: 1. Functional deficits secondary to TBI 2. DVT Prophylaxis/Anticoagulation: Pharmaceutical: Lovenox 3. Pain Management: Will continue oxycodone prn. Will likely need premedication as unable to express needs. Will monitor for now.  4. Mood: Unable to gauge at this time due to cognitive deficits. LCSW to follow along for evaluation and support once more appropriate.  5. Neuropsych: This patient is not capable of making decisions on her own behalf. -continue in vail for safety 6. Skin/Wound Care: Pressure relief measures. Maintain adequate hydration and nutritional status as able.  7. Fluids/Electrolytes/Nutrition: increase ritalin. Added megace---looks like PO is picking up 8. MSSA HCAP: Completed 10 day course of antibiotic regimen 01/17.  9. Reactive Leucocytosis: Resolving.  10. Hypokalemia: Likely due to poor nutritional status as well as dilutional .    11. Agitation: Decreased Seroquel   -remains substantially impaired from a cognitive  standpoint - continue Klonopin 0.25 Mg tid.  - librium will schedule for etoh exacerbated agitation  -sleep cycle improved -Continue vail bed for fall prevention and due to poor insight/awareness-vail soon 12 Activation:  ritalin to help with activation and attention-stop 13. ?GERD/odynophagia--added bid protonix to help with symptoms---vague at best. Having problems swallowing liquids?   LOS (Days) 11 A FACE TO FACE EVALUATION WAS PERFORMED  Justin Pugh T 03/20/2014 8:56 AM

## 2014-03-20 NOTE — Progress Notes (Signed)
Speech Language Pathology Daily Session Note  Patient Details  Name: Justin Pugh MRN: [id number] Date of Birth: February 22, 1955  Today's Date: 03/20/2014 SLP Individual Time: 0800-0900 SLP Individual Time Calculation (min): 60 min  Short Term Goals: Week 2: SLP Short Term Goal 1 (Week 2): Pt will utilize compensatory strategies to maximize safety with swallowing with recommended diet with Min A multimodal cues SLP Short Term Goal 2 (Week 2): Pt will sustain attention to basic, familiar task in structured environment for 5 minutes with Max cues SLP Short Term Goal 3 (Week 2): Pt will utilize external aids and environmental cues to demonstrate orientation x4 with Max cues SLP Short Term Goal 4 (Week 2): Pt will demonstrate intellectual awareness of at least one cognitive and one physical deficit with Max cues SLP Short Term Goal 5 (Week 2): Pt will follow one-step commands with 80% accuracy throughout basic, functional tasks in structured environment with Min cues  Skilled Therapeutic Interventions: Skilled treatment session focused on dysphagia and cognitive goals. Upon arrival, patient was awake while supine in enclosure bed and was agreeable to participate in treatment session. SLP facilitated by transferring the patient to the recliner and donning the quick release belt. Patient required extra time for tray set-up but consumed 100% of his breakfast meal of Dys. 1 textures with nectar-thick liquids without overt s/s of aspiration.  Patient was able to verbalize that he was in "rehab" and that the month was January but required total A for orientation to situation and intellectual awareness of cognitive deficits.  Patient participated in a matching task from a field of 12 with blocks and required extra time and Mod A multimodal cues to complete accurately. Patient was hyper-verbose throughout the session with language of confusion and required Max A multimodal cues for sustained attention to tasks for  ~60 seconds. Patient became verbally agitated towards end of session due to this clinician attempting to redirect him from bathing in toilet water.  Patient handed off to PT. Continue with current plan of care.    FIM:  Comprehension Comprehension Mode: Auditory Comprehension: 2-Understands basic 25 - 49% of the time/requires cueing 51 - 75% of the time Expression Expression Mode: Verbal Expression: 2-Expresses basic 25 - 49% of the time/requires cueing 50 - 75% of the time. Uses single words/gestures. Social Interaction Social Interaction: 1-Interacts appropriately less than 25% of the time. May be withdrawn or combative. Problem Solving Problem Solving: 1-Solves basic less than 25% of the time - needs direction nearly all the time or does not effectively solve problems and may need a restraint for safety Memory Memory: 1-Recognizes or recalls less than 25% of the time/requires cueing greater than 75% of the time FIM - Eating Eating Activity: 5: Supervision/cues  Pain Pain Assessment Pain Assessment: No/denies pain  Therapy/Group: Individual Therapy  Georgiana Spillane 03/20/2014, 2:30 PM

## 2014-03-21 ENCOUNTER — Inpatient Hospital Stay (HOSPITAL_COMMUNITY): Payer: Self-pay

## 2014-03-21 DIAGNOSIS — S062X4D Diffuse traumatic brain injury with loss of consciousness of 6 hours to 24 hours, subsequent encounter: Secondary | ICD-10-CM

## 2014-03-21 NOTE — Progress Notes (Signed)
Patient ID: Katrine CohoJeffrey L Fillion, male   DOB: Apr 03, 1955, 59 y.o.   MRN: [id number]   [facility] PHYSICAL MEDICINE & REHABILITATION     PROGRESS NOTE   03/21/14.  59 y/o admit for CIR with functional deficits secondary to TBI    Subjective/Complaints: Remains agitated and confused ROS limited due to cognition  Patient Vitals for the past 24 hrs:  BP Temp Temp src Pulse Resp SpO2  03/21/14 0605 (!) 143/100 mmHg 98.8 F (37.1 C) Oral 78 18 98 %  03/20/14 1408 (!) 131/91 mmHg 98.1 F (36.7 C) Oral 98 16 100 %     Intake/Output Summary (Last 24 hours) at 03/21/14 1037 Last data filed at 03/21/14 0900  Gross per 24 hour  Intake    840 ml  Output      1 ml  Net    839 ml      Objective: Vital Signs: Blood pressure 143/100, pulse 78, temperature 98.8 F (37.1 C), temperature source Oral, resp. rate 18, weight 82.4 kg (181 lb 10.5 oz), SpO2 98 %. No results found. No results for input(s): WBC, HGB, HCT, PLT in the last 72 hours. No results for input(s): NA, K, CL, GLUCOSE, BUN, CREATININE, CALCIUM in the last 72 hours.  Invalid input(s): CO    Wt Readings from Last 3 Encounters:  03/18/14 82.4 kg (181 lb 10.5 oz)  03/09/14 84.188 kg (185 lb 9.6 oz)  01/23/14 95.255 kg (210 lb)    Physical Exam:  Constitutional: He appears well-developed and well-nourished. He is sleeping. He is easily aroused.  HENT:  Head: Normocephalic.  Neck: supple Cardiovascular: Normal rate and regular rhythm.  Respiratory: Effort normal. No respiratory distress.  GI: Soft. Bowel sounds are normal. He exhibits no distension. There is no tenderness.  Musculoskeletal: He exhibits no edema.  Neurological: He is alert. Good phonation  restless.   Moves all extremities without difficulty. Left side less spontaneous with movement than right but he still moves all 4's. Senses pain in all 4. Oriented to name only. confabulates Skin: Skin is warm and dry.  Psychiatric:  General-Confused, alert,  fairly cooperative. Restless; out of enclosure bed, sitting for breakfast   Medical Problem List and Plan: 1. Functional deficits secondary to TBI 2. DVT Prophylaxis/Anticoagulation: Pharmaceutical: Lovenox 3. Pain Management: Will continue oxycodone prn. Will likely need premedication as unable to express needs. Will monitor for now.  4. Mood: Unable to gauge at this time due to cognitive deficits. LCSW to follow along for evaluation and support once more appropriate.  5. Neuropsych: This patient is not capable of making decisions on her own behalf. -continue in vail for safety 6.  Hypertension-  BP up this am; will continue present treatment and continue to monitor 7. Fluids/Electrolytes/Nutrition: increase ritalin. Added megace---looks like PO is picking up 8. MSSA HCAP: Completed 10 day course of antibiotic regimen 01/17.    9. Agitation: Decreased Seroquel   -remains substantially impaired from a cognitive standpoint - continue Klonopin 0.25 Mg tid.  - librium will schedule for etoh exacerbated agitation  -sleep cycle improved -Continue vail bed for fall prevention and due to poor insight/awareness-vail soon 10 Activation:  ritalin to help with activation and attention-stop 11. ?GERD/odynophagia--added bid protonix to help with symptoms---vague at best. Having problems swallowing liquids?   LOS (Days) 12 A FACE TO FACE EVALUATION WAS PERFORMED  Rogelia BogaKWIATKOWSKI,PETER FRANK 03/21/2014 10:34 AM

## 2014-03-21 NOTE — Progress Notes (Signed)
Physical Therapy Note  Patient Details  Name: Justin Pugh MRN: [id number] Date of Birth: 09/30/1955 Today's Date: 03/21/2014    Time: 807-900 53 minutes  1:1 No c/o pain.  PT arrived as pt was finishing breakfast.  Pt able to finish without cuing or assistance once set up.  Pt hyperverbose throughout session, requiring max cuing for attention to task and for turn taking during conversation. Attempted to engage pt in game of tic tac toe. Pt drew faces and pictures on the board, unable to play game despite max cuing.  Pt then participated in playing "war" and "21" card games.  Pt able to play these games with min cuing from PT for instructions, mod A for attention to task.  Pt able to move about room with supervision without LOB for grooming and gathering clothes.  Pt limited throughout session by verbosity and decreased attention.  Pt left with nurse tech at nurses station with quick release belt donned.   [name] 03/21/2014, 8:56 AM

## 2014-03-22 ENCOUNTER — Inpatient Hospital Stay (HOSPITAL_COMMUNITY): Payer: Self-pay

## 2014-03-22 NOTE — Progress Notes (Signed)
Occupational Therapy Note  Patient Details  Name: Katrine CohoJeffrey L Milledge MRN: [id number] Date of Birth: [birth year]  Today's Date: 03/22/2014 OT Individual Time: 1330-1400 OT Individual Time Calculation (min): 30 min   Pt denied pain Individual Therapy  Pt siting in w/c upon arrival.  Pt initially perseverating on waiting for mother to arrive and wanted to continually look out window.  Pt redirected to engaged in card game (Rummy).  Pt played 3 rounds before returning to perseveration of mother coming to visit.  Pt noted with increased agitated behaviors and returned to enclosure bed.  Pt missed 15 mins skilled OT services.   Lavone NeriLanier, Deneen Slager [facility] 03/22/2014, 3:16 PM

## 2014-03-22 NOTE — Progress Notes (Signed)
Patient using call bell system to alert that he will leave, talked with patient to stay, patient pacing in room, sitter in room at this time, patient agreeable to take medication then states he had plan to leave. Will continue to monitor.

## 2014-03-22 NOTE — Progress Notes (Signed)
Patient agitated, pacing in room, stating he wants to go home, security called to assist , patient has cut holes in the enclosure bed, order has been placed for new enclosure bed, sitter in room, talked patient down to calm state, [facility], still focused on leaving. Will continue to monitor.

## 2014-03-22 NOTE — Progress Notes (Addendum)
Patient is asleep inside enclosure bed , awaiting new enclosure bed at this time, sitter at the bedside will continue to monitor.

## 2014-03-22 NOTE — Progress Notes (Signed)
Patient ID: Justin Pugh, male   DOB: 1955/11/21, 59 y.o.   MRN: [id number]  Patient ID: Justin CohoJeffrey L Pugh, male   DOB: 1955/11/21, 59 y.o.   MRN: [id number]   [place] [facility] RE[facility]ILITATION     PROGRESS NOTE   03/22/14.  59 y/o admit for CIR with functional deficits secondary to TBI    Subjective/Complaints: Remains agitated and confused.  Requires restraints for patient safety.  Remains in enclosure bed ROS limited due to cognition  Patient Vitals for the past 24 hrs:  BP Temp Temp src Pulse Resp SpO2  03/22/14 0601 (!) 135/93 mmHg 97.8 F (36.6 C) Oral 88 20 100 %  03/21/14 1402 (!) 121/93 mmHg 97.3 F (36.3 C) Oral (!) 104 18 100 %     Intake/Output Summary (Last 24 hours) at 03/22/14 0917 Last data filed at 03/21/14 1300  Gross per 24 hour  Intake    120 ml  Output      0 ml  Net    120 ml      Objective: Vital Signs: Blood pressure 135/93, pulse 88, temperature 97.8 F (36.6 C), temperature source Oral, resp. rate 20, weight 82.4 kg (181 lb 10.5 oz), SpO2 100 %. No results found. No results for input(s): WBC, HGB, HCT, PLT in the last 72 hours. No results for input(s): NA, K, CL, GLUCOSE, BUN, CREATININE, CALCIUM in the last 72 hours.  Invalid input(s): CO    Wt Readings from Last 3 Encounters:  03/18/14 82.4 kg (181 lb 10.5 oz)  03/09/14 84.188 kg (185 lb 9.6 oz)  01/23/14 95.255 kg (210 lb)    Physical Exam:  Constitutional: He appears well-developed and well-nourished. He is sleeping. He is confused and agitated HENT:  Head: Normocephalic.  Neck: supple Cardiovascular: Normal rate and regular rhythm.  Respiratory: Effort normal. No respiratory distress.  GI: Soft. Bowel sounds are normal. He exhibits no distension. There is no tenderness.  Musculoskeletal: He exhibits no edema.  Neurological: He is alert. Good phonation  restless.   Moves all extremities without difficulty. confabulates Skin: Skin is warm and dry.   Psychiatric:  General-Confused, alert, agitated  Medical Problem List and Plan: 1. Functional deficits secondary to TBI 2. DVT Prophylaxis/Anticoagulation: Pharmaceutical: Lovenox 3. Pain Management: Will continue oxycodone prn. Will likely need premedication as unable to express needs. Will monitor for now.  4. Mood: Unable to gauge at this time due to cognitive deficits. LCSW to follow along for evaluation and support once more appropriate.  5. Neuropsych: This patient is not capable of making decisions on her own behalf. -continue in vail for safety 6.  Hypertension-  BP up this am; will continue present treatment and continue to monitor 7. Fluids/Electrolytes/Nutrition: increase ritalin. Added megace---looks like PO is picking up 8. MSSA HCAP: Completed 10 day course of antibiotic regimen 01/17.    9. Agitation: Decreased Seroquel   -remains substantially impaired from a cognitive standpoint - continue Klonopin 0.25 Mg tid.  - librium will schedule for etoh exacerbated agitation  -sleep cycle improved -Continue vail bed for fall prevention and due to poor insight/awareness-vail soon 10 Activation:  ritalin to help with activation and attention-stop 11. ?GERD/odynophagia--added bid protonix to help with symptoms---vague at best. Having problems swallowing liquids?   LOS (Days) 13 A FACE TO FACE EVALUATION WAS PERFORMED  Rogelia BogaKWIATKOWSKI,PETER FRANK 03/22/2014 9:17 AM
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
## 2014-03-22 NOTE — Progress Notes (Signed)
Patient was severely agitated at change of shift, yelling out continuously and using profanity and perseverated on leaving to go home. Attempts to redirect patient was ineffective, pt became more agitated. RN administered Haldol 2 mg IM injection at 1923. Patient continued to yell out for about 30-40 more minutes, then patient finally calmed down and no longer was as agitated as he was. Scheduled meds were given without difficulty or attempts to leave. Patient stated that he was sleepy and wanted to go to sleep. Continue plan of care.  Lauranne Beyersdorf, Phill MutterMelissa Rebecca

## 2014-03-23 ENCOUNTER — Inpatient Hospital Stay (HOSPITAL_COMMUNITY): Payer: Self-pay | Admitting: *Deleted

## 2014-03-23 ENCOUNTER — Inpatient Hospital Stay (HOSPITAL_COMMUNITY): Payer: Medicaid Other | Admitting: Speech Pathology

## 2014-03-23 ENCOUNTER — Encounter (HOSPITAL_COMMUNITY): Payer: Self-pay

## 2014-03-23 ENCOUNTER — Inpatient Hospital Stay (HOSPITAL_COMMUNITY): Payer: Medicaid Other | Admitting: *Deleted

## 2014-03-23 MED ORDER — PROPRANOLOL HCL 20 MG PO TABS
20.0000 mg | ORAL_TABLET | Freq: Two times a day (BID) | ORAL | Status: DC
  Administered 2014-03-23 – 2014-04-02 (×19): 20 mg via ORAL
  Filled 2014-03-23 (×23): qty 1

## 2014-03-23 MED ORDER — HALOPERIDOL LACTATE 5 MG/ML IJ SOLN
INTRAMUSCULAR | Status: AC
  Administered 2014-03-23: 2 mg
  Filled 2014-03-23: qty 1

## 2014-03-23 MED ORDER — HALOPERIDOL 2 MG PO TABS
2.0000 mg | ORAL_TABLET | Freq: Once | ORAL | Status: AC
  Filled 2014-03-23: qty 1
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
## 2014-03-23 NOTE — Progress Notes (Signed)
Speech Language Pathology Daily Session Note  Patient Details  Name: Justin CohoJeffrey L Hunt MRN: [id number] Date of Birth: [birth year]  Today's Date: 03/23/2014 SLP Individual Time: 0900-1000 SLP Individual Time Calculation (min): 60 min  Short Term Goals: Week 2: SLP Short Term Goal 1 (Week 2): Pt will utilize compensatory strategies to maximize safety with swallowing with recommended diet with Min A multimodal cues SLP Short Term Goal 2 (Week 2): Pt will sustain attention to basic, familiar task in structured environment for 5 minutes with Max cues SLP Short Term Goal 3 (Week 2): Pt will utilize external aids and environmental cues to demonstrate orientation x4 with Max cues SLP Short Term Goal 4 (Week 2): Pt will demonstrate intellectual awareness of at least one cognitive and one physical deficit with Max cues SLP Short Term Goal 5 (Week 2): Pt will follow one-step commands with 80% accuracy throughout basic, functional tasks in structured environment with Min cues  Skilled Therapeutic Interventions: Skilled treatment session focused on dysphagia and cognitive goals. Upon arrival, patient was asleep with sitter present. SLP facilitated session by providing trials of thin liquids as well as Dys.2, Dys. 3 and regular textures.  Patient consumed thin liquids via cup without overt s/s of aspiration and demonstrated efficient mastication with all trials of textures but required Max A multimodal cues to sustain attention to task and decrease verbosity while masticating, therefore, recommend patient upgrade to Dys. 2 textures with thin liquids with full supervision.  Patient was able to sit in place for ~45 minutes to complete trials and completed basic self-care task of brushing his teeth with Mod A multimodal for functional problem solving.  Patient became perseverative on leaving and finding cigarettes toward end of session and required Max A multimodal cues for redirection to functional tasks. Patient left  with sitter and RN in room. Continue with current plan of care.    FIM:  Comprehension Comprehension Mode: Auditory Comprehension: 2-Understands basic 25 - 49% of the time/requires cueing 51 - 75% of the time Expression Expression Mode: Verbal Expression: 2-Expresses basic 25 - 49% of the time/requires cueing 50 - 75% of the time. Uses single words/gestures. Social Interaction Social Interaction: 2-Interacts appropriately 25 - 49% of time - Needs frequent redirection. Problem Solving Problem Solving: 1-Solves basic less than 25% of the time - needs direction nearly all the time or does not effectively solve problems and may need a restraint for safety Memory Memory: 1-Recognizes or recalls less than 25% of the time/requires cueing greater than 75% of the time FIM - Eating Eating Activity: 5: Supervision/cues  Pain Pain Assessment Pain Assessment: No/denies pain Pain Score: 0-No pain  Therapy/Group: Individual Therapy  Neil Brickell 03/23/2014, 3:30 PM

## 2014-03-23 NOTE — Progress Notes (Signed)
Physical Therapy Session Note  Patient Details  Name: Justin Pugh MRN: [id number] Date of Birth: January 02, 1956  Today's Date: 03/23/2014 PT Individual Time: 1000-1121 and 1300-1400 and 1519-1600 PT Individual Time Calculation (min): 81 min and 60 min and 41 min  Short Term Goals: Week 2:  PT Short Term Goal 1 (Week 2): STGs=LTGs  Skilled Therapeutic Interventions/Progress Updates:    First session: Patient received ambulating around room. Session focused on de-escalation techniques and patient's tolerance to therapist's presence in room. Patient escalating upon entry of this therapist to room. Patient cursing, yelling, throwing obstacles around room, slamming doors, pacing, repeatedly hitting call bell and code blue buttons, etc. Patient unable to be redirected despite total cues. This therapist and nurse tech remained in room, blocking door and barricaded with 2 chairs for safety secondary to patient perseverating on leaving. Patient with multiple threats to leave, hurting people in his way, etc.   Ultimately, RN required to administer Haldol to facilitate de-escalation, lights turns off, minimal people in room. Up to 30 minutes after Haldol administered, patient continues with significant agitation and therefore, security required to be called. After security presence for approx 30 min, patient de-escalated and laid down in bed. Spoke with charge nurse as well as Architectural technologistrehab director about need for new enclosure bed, as patient has destroyed current one. Patient left asleep in bed with sitter/nurse tech present with phone for safety.  Second session: Patient received ambulating around room. Session focused on maintaining de-escalated state, patient's tolerance to therapist's presence in room, and safety awareness with mobility and self-feeding. Patient had just awoken from nap, facilitated patient eating meal. Patient consumed 100% of meal with supervision and min cues for appropriate bite/sip size.  Remainder of session spent maintaining patient's current de-escalated state, patient moving around room with distant supervision, including in and out of bathroom. Patient with multiple attempts to leave room, where therapist and sitter sitting in front of door and able to be easily redirected. Patient attempting to manipulate situation, stating he has to return his tray or meet someone outside. Again, patient redirected with min cues. Patient left sitting in recliner with sitter present, no agitation noted.  Third session: Patient received ambulating in room. Session focused on maintaining de-escalated state, patient's tolerance to therapist's presence in room, and safety awareness. Patient moving around room with distant supervision, including in and out of bathroom. Patient with multiple attempts to leave room, where therapist and sitter sitting in front of door and able to be easily redirected. Patient attempting to manipulate situation, stating he has to meet someone or get a cup of coffee. Again, patient redirected with min cues. Patient left supine in bed with sitter present, no agitation noted.   Therapy Documentation Precautions:  Precautions Precautions: Fall Precaution Comments: Elopement risk Restrictions Weight Bearing Restrictions: No Pain: Pain Assessment Pain Assessment: No/denies pain Pain Score: 0-No pain Locomotion : Ambulation Ambulation/Gait Assistance: 5: Supervision   See FIM for current functional status  Therapy/Group: Individual Therapy  Chipper HerbBridget S Darlena Koval Justin Pugh, PT, DPT 03/23/2014, 11:39 AM

## 2014-03-23 NOTE — Progress Notes (Signed)
Occupational Therapy Session Note  Patient Details  Name: Justin Pugh MRN: [id number] Date of Birth: 17-Dec-1955  Today's Date: 03/23/2014 OT Individual Time: 0700-0748 OT Individual Time Calculation (min): 48 min  Missing 12 min due to fatigue  Short Term Goals: Week 2:  OT Short Term Goal 1 (Week 2): Pt will wash 10/10 body parts at supervision level with max multimodal cues OT Short Term Goal 2 (Week 2): Pt will be oriented x2 with max cues  OT Short Term Goal 3 (Week 2): Pt will demonstrate sustained attention to functional task for 1 min with max cues  OT Short Term Goal 4 (Week 2): Pt will identify 1 physical or cognitive deficit with max cues  Skilled Therapeutic Interventions/Progress Updates:    Pt seen for 1:1 OT session with focus on sustained attention, balance, orientation, and overall cognitive remediation. Pt received asleep in enclosure bed with sitter present. Pt easily aroused. Ambulated to bathroom at supervision level to complete toileting. Pt attempting to wash hands, however water turned off for pt's safety. With increased time and mod cues, pt agreeable to participate in card game. Played "21" initially for approx 10 min with mod cues. Pt completing simple math with 80% accuracy and demonstrating appropriate turn taking 75% of time. Pt perseverating on cigarettes and ambulated about room looking for cigarettes. Pt able to be redirected with breakfast. Pt sat to eat less than 50% of meal with min cues for attention. Pt began ambulating about room looking for cigarettes again, then able to be re-directed to card game. Began card game of "war" with 50% accuracy playing and turn taking. Pt reporting wanting to sleep therefore left supine in bed with sitter present. Pt oriented to person and month with min cues. Pt cursing at times during session, however no agitation noted.   Therapy Documentation Precautions:  Precautions Precautions: Fall Precaution Comments: Elopement  risk Restrictions Weight Bearing Restrictions: No General:   Vital Signs: Therapy Vitals Temp: 98.1 F (36.7 C) Temp Source: Oral Pulse Rate: (!) 101 Resp: 19 BP: (!) 145/86 mmHg Patient Position (if appropriate): Lying Oxygen Therapy SpO2: 98 % O2 Device: Not Delivered Pain: No report of pain  See FIM for current functional status  Therapy/Group: Individual Therapy  Daneil Danerkinson, Shyteria Lewis N 03/23/2014, 7:53 AM

## 2014-03-23 NOTE — Progress Notes (Addendum)
[place] PHYSICAL MEDICINE & REHABILITATION     PROGRESS NOTE    Subjective/Complaints: Up with OT. Restless, confused ROS limited due to cognition  Objective: Vital Signs: Blood pressure 145/86, pulse 101, temperature 98.1 F (36.7 C), temperature source Oral, resp. rate 19, weight 82.4 kg (181 lb 10.5 oz), SpO2 98 %. No results found. No results for input(s): WBC, HGB, HCT, PLT in the last 72 hours. No results for input(s): NA, K, CL, GLUCOSE, BUN, CREATININE, CALCIUM in the last 72 hours.  Invalid input(s): CO CBG (last 3)  No results for input(s): GLUCAP in the last 72 hours.  Wt Readings from Last 3 Encounters:  03/23/14 82.4 kg (181 lb 10.5 oz)  03/09/14 84.188 kg (185 lb 9.6 oz)  01/23/14 95.255 kg (210 lb)    Physical Exam:  Constitutional: He appears well-developed and well-nourished. He is sleeping. He is easily aroused.  HENT:  Head: Normocephalic.  Neck: supple Cardiovascular: Normal rate and regular rhythm.  Respiratory: Effort normal. No respiratory distress.  GI: Soft. Bowel sounds are normal. He exhibits no distension. There is no tenderness.  Musculoskeletal: He exhibits no edema.  Neurological: He is alert. Good phonation  restless.   Moves all extremities without difficulty. equally moves all 4's. Senses pain in all 4. Oriented to name only. confabulates Skin: Skin is warm and dry.  Psychiatric:  Confused, alert, fairly cooperative.  restless  Assessment/Plan: 1. 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 Physiatrist is providing close team supervision and 24 hour management of active medical problems listed below. Physiatrist and rehab team continue to assess barriers to discharge/monitor patient progress toward functional and medical goals. FIM: FIM - Bathing Bathing Steps Patient Completed: Chest, Abdomen, Front perineal area, Right Arm,  Left Arm, Buttocks, Right upper leg, Left upper leg Bathing: 4: Min-Patient completes 8-9 [id number] 10 parts or 75+ percent  FIM - Upper Body Dressing/Undressing Upper body dressing/undressing steps patient completed: Thread/unthread right sleeve of pullover shirt/dresss, Thread/unthread left sleeve of pullover shirt/dress, Put head through opening of pull over shirt/dress, Pull shirt over trunk Upper body dressing/undressing: 5: Supervision: Safety issues/verbal cues FIM - Lower Body Dressing/Undressing Lower body dressing/undressing steps patient completed: Thread/unthread right underwear leg, Thread/unthread left underwear leg, Pull underwear up/down, Thread/unthread right pants leg, Thread/unthread left pants leg, Pull pants up/down, Don/Doff right sock, Don/Doff left sock, Don/Doff right shoe, Fasten/unfasten left shoe, Fasten/unfasten right shoe, Don/Doff left shoe Lower body dressing/undressing: 5: Supervision: Safety issues/verbal cues  FIM - Toileting Toileting steps completed by patient: Adjust clothing prior to toileting, Performs perineal hygiene, Adjust clothing after toileting Toileting Assistive Devices: Grab bar or rail for support Toileting: 5: Supervision: Safety issues/verbal cues  FIM - Diplomatic Services operational officerToilet Transfers Toilet Transfers Assistive Devices: Therapist, musicGrab bars Toilet Transfers: 5-To toilet/BSC: Supervision (verbal cues/safety issues), 5-From toilet/BSC: Supervision (verbal cues/safety issues)  FIM - BankerBed/Chair Transfer Bed/Chair Transfer Assistive Devices: Arm rests Bed/Chair Transfer: 5: Supine > Sit: Supervision (verbal cues/safety issues), 5: Sit > Supine: Supervision (verbal cues/safety issues), 5: Bed > Chair or W/C: Supervision (verbal cues/safety issues), 5: Chair or W/C > Bed: Supervision (verbal cues/safety issues)  FIM - Locomotion: Wheelchair Locomotion: Wheelchair: 0: Activity did not occur FIM - Locomotion: Ambulation Locomotion: Ambulation Assistive Devices: Other (comment)  (none) Ambulation/Gait Assistance: 5: Supervision Locomotion: Ambulation: 1: Travels less than 50 ft with supervision/safety issues  Comprehension Comprehension Mode: Auditory Comprehension: 2-Understands basic 25 - 49% of the time/requires cueing  51 - 75% of the time  Expression Expression Mode: Verbal Expression: 2-Expresses basic 25 - 49% of the time/requires cueing 50 - 75% of the time. Uses single words/gestures.  Social Interaction Social Interaction: 2-Interacts appropriately 25 - 49% of time - Needs frequent redirection.  Problem Solving Problem Solving: 1-Solves basic less than 25% of the time - needs direction nearly all the time or does not effectively solve problems and may need a restraint for safety  Memory Memory: 1-Recognizes or recalls less than 25% of the time/requires cueing greater than 75% of the time  Medical Problem List and Plan: 1. Functional deficits secondary to TBI 2. DVT Prophylaxis/Anticoagulation: Pharmaceutical: Lovenox 3. Pain Management: Will continue oxycodone prn. Will likely need premedication as unable to express needs. Will monitor for now.  4. Mood: Unable to gauge at this time due to cognitive deficits. LCSW to follow along for evaluation and support once more appropriate.  5. Neuropsych: This patient is not capable of making decisions on her own behalf. -continue in vail for safety 6. Skin/Wound Care: Pressure relief measures. Maintain adequate hydration and nutritional status as able.  7. Fluids/Electrolytes/Nutrition:megace stoppped 8. MSSA HCAP: Completed 10 day course of antibiotic regimen 01/17.  9. Reactive Leucocytosis: Resolving.  10. Hypokalemia: Likely due to poor nutritional status as well as dilutional .    11. Agitation: worse Friday and over weekend  -seroquel - continue Klonopin 0.0.5mg  tid - librium  tid -sleep cycle improved -Continue  vail bed for fall prevention and due to poor insight/awareness-vail soon 12 Activation:  Ritalin stopped due to agitation 13. ?GERD/odynophagia--resolved?   LOS (Days) 14 A FACE TO FACE EVALUATION WAS PERFORMED  SWARTZ,ZACHARY T 03/23/2014 8:06 AM

## 2014-03-24 ENCOUNTER — Encounter (HOSPITAL_COMMUNITY): Payer: Self-pay

## 2014-03-24 ENCOUNTER — Inpatient Hospital Stay (HOSPITAL_COMMUNITY): Payer: Medicaid Other | Admitting: *Deleted

## 2014-03-24 ENCOUNTER — Inpatient Hospital Stay (HOSPITAL_COMMUNITY): Payer: Medicaid Other | Admitting: Speech Pathology
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
## 2014-03-24 NOTE — Progress Notes (Signed)
Speech Language Pathology Weekly Progress and Session Note  Patient Details  Name: Justin Pugh MRN: [id number] Date of Birth: 28-May-1955  Beginning of progress report period: March 19, 2014 End of progress report period: March 24, 2014  Today's Date: 03/24/2014 SLP Individual Time: 1130-1200 SLP Individual Time Calculation (min): 30 min and Today's Date: 03/24/2014 SLP Missed Time: 30 Minutes Missed Time Reason: Patient unwilling to participate;Patient fatigue  Short Term Goals: Week 2: SLP Short Term Goal 1 (Week 2): Pt will utilize compensatory strategies to maximize safety with swallowing with recommended diet with Min A multimodal cues SLP Short Term Goal 1 - Progress (Week 2): Not met SLP Short Term Goal 2 (Week 2): Pt will sustain attention to basic, familiar task in structured environment for 5 minutes with Max cues SLP Short Term Goal 2 - Progress (Week 2): Met SLP Short Term Goal 3 (Week 2): Pt will utilize external aids and environmental cues to demonstrate orientation x4 with Max cues SLP Short Term Goal 3 - Progress (Week 2): Not met SLP Short Term Goal 4 (Week 2): Pt will demonstrate intellectual awareness of at least one cognitive and one physical deficit with Max cues SLP Short Term Goal 4 - Progress (Week 2): Not met SLP Short Term Goal 5 (Week 2): Pt will follow one-step commands with 80% accuracy throughout basic, functional tasks in structured environment with Min cues    New Short Term Goals: Week 3: SLP Short Term Goal 1 (Week 3): Pt will consume current diet with minimal overt s/s of aspiration with  Mod A multimodal cues for use of swallow strategies  SLP Short Term Goal 2 (Week 3): Pt will sustain attention to basic, familiar task in structured environment for 10 minutes with Max cues SLP Short Term Goal 3 (Week 3): Pt will utilize external aids and environmental cues to demonstrate orientation to place, time and situation with Max cues SLP Short Term Goal 4  (Week 3): Patient will participate in a 30 minute session without verbal or physical agitiation with Max A multimodal cues.  SLP Short Term Goal 5 (Week 3): Pt will follow one-step commands with 80% accuracy throughout basic, functional tasks in structured environment with Max cues  Weekly Progress Updates: Patient has met 1 of 4 short term goals this reporting period due to limited and inconsistent gains, patient's progress has been limited by patient's increased verbal and physical agitation throughout the day. Currently, patient is demonstrating behaviors consistent with a Rancho Level IV and requires Max-Total A multimodal cues for orientation to time, place and situation, sustained attention, intellectual awareness, working memory and functional problem solving.  Patient is also hyperverbose and demonstrates language of confusion with impulsivity. Patient is currently consuming Dys. 2 textures with thin liquids with an intermittent wet vocal quality and requires overall Max A multimodal cues for utilization of swallowing compensatory strategies. Patient has potential for diet upgrade once attention and ability to follow commands improves. Patient would benefit from continued skilled SLP intervention in order to maximize his cognitive and swallowing function in order to maximize his overall functional independence and reduce caregiver burden prior to discharge.   Intensity: Minumum of 1-2 x/day, 30 to 90 minutes Frequency: 5 out of 7 days Duration/Length of Stay: TBD due to SNF placement  Treatment/Interventions: Cognitive remediation/compensation;Cueing hierarchy;Dysphagia/aspiration precaution training;Environmental controls;Functional tasks;Internal/external aids;Patient/family education;Speech/Language facilitation;Therapeutic Activities   Daily Session  Skilled Therapeutic Interventions: Skilled treatment session focused on cognitive and dysphagia goals. Upon arrival, patient was asleep while  supine in enclosure bed and required Total A multimodal cues for arousal. Patient declined getting out of bed due to fatigue, therefore, clinician allowed patient to continue to rest. Patient eventually agreeable to sit in recliner to consume lunch meal of Dys. 2 textures with thin liquids. Patient independently performed tray set-up impulsively and without purpose (putting salt and pepper in tea). Patient demonstrated cough X 2 and intermittent wet vocal quality X 2, suspect due to large, sequential sips of thin liquids which required total A to self-monitor and correct with a throat clear.  Patient ambulated back to bed at end of session and reported he was no longer going to participate due to fatigue, therefore, patient missed 30 minutes of skilled SLP treatment. Patient left in bed with sitter present. Continue with current plan of care.    FIM:  Comprehension Comprehension Mode: Auditory Comprehension: 2-Understands basic 25 - 49% of the time/requires cueing 51 - 75% of the time Expression Expression Mode: Verbal Expression: 2-Expresses basic 25 - 49% of the time/requires cueing 50 - 75% of the time. Uses single words/gestures. Social Interaction Social Interaction: 1-Interacts appropriately less than 25% of the time. May be withdrawn or combative. Problem Solving Problem Solving: 1-Solves basic less than 25% of the time - needs direction nearly all the time or does not effectively solve problems and may need a restraint for safety Memory Memory: 1-Recognizes or recalls less than 25% of the time/requires cueing greater than 75% of the time FIM - Eating Eating Activity: 5: Supervision/cues Pain No/Denies Pain   Therapy/Group: Individual Therapy  Kayne Yuhas 03/24/2014, 3:28 PM
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
## 2014-03-24 NOTE — Progress Notes (Signed)
Occupational Therapy Session Note  Patient Details  Name: Justin Pugh MRN: [id number] Date of Birth: 1955-09-03  Today's Date: 03/24/2014 OT Individual Time: 1000-1030 OT Individual Time Calculation (min): 30 min    Short Term Goals: Week 1:  OT Short Term Goal 1 (Week 1): Pt will demonstrate sustained attention to self-care task for 1 min with mod cues OT Short Term Goal 1 - Progress (Week 1): Met OT Short Term Goal 2 (Week 1): Pt will consistently be oriented to x3 with max cues OT Short Term Goal 2 - Progress (Week 1): Not met OT Short Term Goal 3 (Week 1): Pt will complete self-care task in standing for 1 min with min assist for balance OT Short Term Goal 3 - Progress (Week 1): Met OT Short Term Goal 4 (Week 1): Pt will complete bathing task with min assist and mod cues for sequencing and attention OT Short Term Goal 4 - Progress (Week 1): Progressing toward goal  Skilled Therapeutic Interventions/Progress Updates:    Pt seen for 1:1 OT session with focus on sustained attention, task completion, and functional mobility. Pt received asleep in enclosure bed with sitter present and reporting pt had just completed bathing and dressing. Per NT, pt required supervision for self-care tasks with max cues for sequencing. Pt required increased time to arousal and slightly frustrated due feeling "tired." Pt oriented to self only. Ambulated to laundry room to complete laundry task with use of rewards system for allowance of being out of room with staff. Pt required supervision for functional mobility and reaching into washer and dryer to remove/place clothing. Attempted to engage in functional activity in day room however pt consistently placing head on table, closing eyes and requesting to sleep. Informed pt he could rest if he could locate room. Pt located room without difficulty and returned to bed. Therapist returned 15 min later and pt asleep however agreeable to retrieve clean clothes. Pt  required mod cues to locate laundry room then pt able to locate room. Utilized behavioral management technique that pt must place clothes in drawer before returning to supine. Pt completed 50% of task then returning to bed and placing covers overhead. Pt declined further participation therefore allowed pt to rest to prevent escalating behavior. Pt left asleep with sitter present.   Therapy Documentation Precautions:  Precautions Precautions: Fall Precaution Comments: Elopement risk Restrictions Weight Bearing Restrictions: No General: General OT Amount of Missed Time: 30 Minutes Vital Signs:  Pain: Pain Assessment Pain Assessment: No/denies pain Pain Score: 0-No pain  See FIM for current functional status  Therapy/Group: Individual Therapy  Duayne Cal 03/24/2014, 11:27 AM

## 2014-03-24 NOTE — Progress Notes (Signed)
[facility]ICINE & REHABILITATION     PROGRESS NOTE    Subjective/Complaints: Only one outburst per staff last night. Sleeping comfortably upon my arrival ROS limited due to cognition  Objective: Vital Signs: Blood pressure 177/80, pulse 77, temperature 98.5 F (36.9 C), temperature source Oral, resp. rate 16, weight 84.188 kg (185 lb 9.6 oz), SpO2 100 %. No results found. No results for input(s): WBC, HGB, HCT, PLT in the last 72 hours. No results for input(s): NA, K, CL, GLUCOSE, BUN, CREATININE, CALCIUM in the last 72 hours.  Invalid input(s): CO CBG (last 3)  No results for input(s): GLUCAP in the last 72 hours.  Wt Readings from Last 3 Encounters:  03/24/14 84.188 kg (185 lb 9.6 oz)  03/09/14 84.188 kg (185 lb 9.6 oz)  01/23/14 95.255 kg (210 lb)    Physical Exam:  Constitutional: He appears well-developed and well-nourished. He is sleeping. He is easily aroused.  HENT:  Head: Normocephalic.  Neck: supple Cardiovascular: Normal rate and regular rhythm.  Respiratory: Effort normal. No respiratory distress.  GI: Soft. Bowel sounds are normal. He exhibits no distension. There is no tenderness.  Musculoskeletal: He exhibits no edema.  Neurological: He is alert. Good phonation  restless.   Moves all extremities without difficulty. equally moves all 4's. Senses pain in all 4. Oriented to name only. confabulates Skin: Skin is warm and dry.  Psychiatric:  Confused, alert, fairly cooperative.  restless  Assessment/Plan: 1. 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 Physiatrist is providing close team supervision and 24 hour management of active medical problems listed below. Physiatrist and rehab team continue to assess barriers to discharge/monitor patient progress toward functional and medical goals. FIM: FIM - Bathing Bathing Steps Patient Completed:  Chest, Abdomen, Front perineal area, Right Arm, Left Arm, Buttocks, Right upper leg, Left upper leg Bathing: 4: Min-Patient completes 8-9 [id number] 10 parts or 75+ percent  FIM - Upper Body Dressing/Undressing Upper body dressing/undressing steps patient completed: Thread/unthread right sleeve of pullover shirt/dresss, Thread/unthread left sleeve of pullover shirt/dress, Put head through opening of pull over shirt/dress, Pull shirt over trunk Upper body dressing/undressing: 5: Supervision: Safety issues/verbal cues FIM - Lower Body Dressing/Undressing Lower body dressing/undressing steps patient completed: Thread/unthread right underwear leg, Thread/unthread left underwear leg, Pull underwear up/down, Thread/unthread right pants leg, Thread/unthread left pants leg, Pull pants up/down, Don/Doff right sock, Don/Doff left sock, Don/Doff right shoe, Fasten/unfasten left shoe, Fasten/unfasten right shoe, Don/Doff left shoe Lower body dressing/undressing: 5: Supervision: Safety issues/verbal cues  FIM - Toileting Toileting steps completed by patient: Adjust clothing prior to toileting, Adjust clothing after toileting Toileting Assistive Devices: Grab bar or rail for support Toileting: 5: Supervision: Safety issues/verbal cues  FIM - Diplomatic Services operational officerToilet Transfers Toilet Transfers Assistive Devices: Therapist, musicGrab bars Toilet Transfers: 5-To toilet/BSC: Supervision (verbal cues/safety issues), 5-From toilet/BSC: Supervision (verbal cues/safety issues)  FIM - BankerBed/Chair Transfer Bed/Chair Transfer Assistive Devices: Arm rests Bed/Chair Transfer: 5: Supine > Sit: Supervision (verbal cues/safety issues), 5: Sit > Supine: Supervision (verbal cues/safety issues), 5: Bed > Chair or W/C: Supervision (verbal cues/safety issues), 5: Chair or W/C > Bed: Supervision (verbal cues/safety issues)  FIM - Locomotion: Wheelchair Locomotion: Wheelchair: 0: Activity did not occur (patient ambulatory) FIM - Locomotion: Ambulation Locomotion:  Ambulation Assistive Devices: Other (comment) (none) Ambulation/Gait Assistance: 5: Supervision Locomotion: Ambulation: 1: Travels less than 50 ft with supervision/safety issues  Comprehension Comprehension Mode: Auditory Comprehension: 2-Understands basic 25 -  49% of the time/requires cueing 51 - 75% of the time  Expression Expression Mode: Verbal Expression: 2-Expresses basic 25 - 49% of the time/requires cueing 50 - 75% of the time. Uses single words/gestures.  Social Interaction Social Interaction: 2-Interacts appropriately 25 - 49% of time - Needs frequent redirection.  Problem Solving Problem Solving: 1-Solves basic less than 25% of the time - needs direction nearly all the time or does not effectively solve problems and may need a restraint for safety  Memory Memory: 1-Recognizes or recalls less than 25% of the time/requires cueing greater than 75% of the time  Medical Problem List and Plan: 1. Functional deficits secondary to TBI 2. DVT Prophylaxis/Anticoagulation: Pharmaceutical: Lovenox 3. Pain Management: Will continue oxycodone prn. Will likely need premedication as unable to express needs. Will monitor for now.  4. Mood: Unable to gauge at this time due to cognitive deficits. LCSW to follow along for evaluation and support once more appropriate.  5. Neuropsych: This patient is not capable of making decisions on her own behalf. -continue in vail for safety 6. Skin/Wound Care: Pressure relief measures. Maintain adequate hydration and nutritional status as able.  7. Fluids/Electrolytes/Nutrition:megace stoppped 8. MSSA HCAP: Completed 10 day course of antibiotic regimen 01/17.  9. Reactive Leucocytosis: Resolving.  10. Hypokalemia: Likely due to poor nutritional status as well as dilutional .    11. Agitation: better yesterday and overnight  -seroquel - continue Klonopin 0.0.5mg  tid - librium   tid -sleep cycle improving -Continue vail bed for fall prevention and due to poor insight/awareness-vail soon 12 Activation:  Ritalin stopped due to agitation 13. ?GERD/odynophagia--resolved?   LOS (Days) 15 A FACE TO FACE EVALUATION WAS PERFORMED  Starlene Consuegra T 03/24/2014 8:00 AM
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
## 2014-03-24 NOTE — Progress Notes (Signed)
Physical Therapy Session Note  Patient Details  Name: Justin Pugh MRN: [id number] Date of Birth: 05/07/1955  Today's Date: 03/24/2014 PT Individual Time: 1530-1555 PT Individual Time Calculation (min): 25 min   Short Term Goals: Week 2:  PT Short Term Goal 1 (Week 2): STGs=LTGs  Skilled Therapeutic Interventions/Progress Updates:    Patient received sitting EOB with sitter and mother present. Session focused on therapeutic conversation with emphasis on safety awareness and behavioral management. Various aspects of patient's behavior addressed through conversation, however, patient appearing unable to retain information and does not appear to recall his agitated states. Session ended when patient laid down in bed stating "I'm tired of talking about this." Patient left supine in enclosure bed with sitter and mother present. SW present to discuss discharge planning with patient's mother.  Therapy Documentation Precautions:  Precautions Precautions: Fall Precaution Comments: Elopement risk Restrictions Weight Bearing Restrictions: No General: PT Amount of Missed Time (min): 20 Minutes PT Missed Treatment Reason: Patient unwilling to participate;Patient fatigue Pain: Pain Assessment Pain Assessment: No/denies pain Pain Score: 0-No pain Locomotion : Ambulation Ambulation/Gait Assistance: 5: Supervision   See FIM for current functional status  Therapy/Group: Individual Therapy  Chipper HerbBridget S Consuela Widener S. Rahmir Beever, PT, DPT 03/24/2014, 4:05 PM

## 2014-03-25 ENCOUNTER — Inpatient Hospital Stay (HOSPITAL_COMMUNITY): Payer: Medicaid Other | Admitting: *Deleted

## 2014-03-25 ENCOUNTER — Encounter (HOSPITAL_COMMUNITY): Payer: Self-pay

## 2014-03-25 ENCOUNTER — Inpatient Hospital Stay (HOSPITAL_COMMUNITY): Payer: Medicaid Other | Admitting: Speech Pathology

## 2014-03-25 ENCOUNTER — Inpatient Hospital Stay (HOSPITAL_COMMUNITY): Payer: Self-pay

## 2014-03-25 NOTE — Progress Notes (Signed)
Physical Therapy Weekly Progress Note  Patient Details  Name: Justin Pugh MRN: [id number] Date of Birth: 04-Jan-1956  Beginning of progress report period: March 18, 2014 End of progress report period: March 25, 2014  Today's Date: 03/25/2014 PT Individual Time: 8329-1916 PT Individual Time Calculation (min): 23 min   Patient has met 6 of 14 long term goals. Patient is currently functioning at overall mod I-supervision level with all functional mobility in a controlled environment. Patient is currently demonstrating behaviors consistent with Rancho Level IV. Patient with fluctuating levels of verbal and physical agitation. Currently, patient is at high risk for elopement as well as at risk for harming himself or others due to his behavior. At this time, patient's time outside his room is limited due to various safety concerns and focus of physical therapy has been on attention, awareness, and behavioral management with emphasis on de-escalation techniques.  Patient continues to demonstrate the following deficits: poor activity tolerance, decreased ability to compensate for deficits, decreased sustained attention, decreased intellectual awareness, no emergent awareness, no anticipatory awareness, decreased safety awareness, bouts of agitation, poor behavioral management, inability to redirect, inability to engage in therapeutic tasks, decreased ability to de-escalate when agitated, and therefore will continue to benefit from skilled PT intervention to enhance overall performance with activity tolerance, balance, ability to compensate for deficits, attention, awareness, knowledge of precautions and behavioral management.  See Patient's Care Plan for progression toward long term goals.  Patient progressing toward long term goals..  Continue plan of care.  Skilled Therapeutic Interventions/Progress Updates:    First session: Patient received sitting EOB with sitter present. Session focused on  therapeutic conversation with emphasis on intellectual awareness, orientation, safety awareness. Patient oriented only to self, able to state he is at "[facility]", but states he is "not at a hospital." Patient continuing to escalate as session progresses with increasing cursing and yelling as well as positional changes and pacing around room. Patient unable to be directed to despite multimodal cues and ultimately able to be coaxed into enclosure bed via negotiation. Patient left supine in enclosure bed with RN notified.  Therapy Documentation Precautions:  Precautions Precautions: Fall Precaution Comments: Elopement risk Restrictions Weight Bearing Restrictions: No General: PT Amount of Missed Time (min): 7 Minutes PT Missed Treatment Reason: Increased agitation Pain: Pain Assessment Pain Assessment: No/denies pain Pain Score: 0-No pain Locomotion : Ambulation Ambulation/Gait Assistance: 5: Supervision   See FIM for current functional status  Therapy/Group: Individual Therapy  [name]. Quenten Nawaz, PT, DPT 03/25/2014, 2:52 PM

## 2014-03-25 NOTE — Progress Notes (Signed)
PHYSICAL MEDICINE & REHABILITATION     PROGRESS NOTE    Subjective/Complaints: Up in bed. Wants to get out and get breakfast ROS limited due to cognition  Objective: Vital Signs: Blood pressure 126/86, pulse 77, temperature 97.7 F (36.5 C), temperature source Oral, resp. rate 15, weight 83.054 kg (183 lb 1.6 oz), SpO2 100 %. No results found. No results for input(s): WBC, HGB, HCT, PLT in the last 72 hours. No results for input(s): NA, K, CL, GLUCOSE, BUN, CREATININE, CALCIUM in the last 72 hours.  Invalid input(s): CO CBG (last 3)  No results for input(s): GLUCAP in the last 72 hours.  Wt Readings from Last 3 Encounters:  03/25/14 83.054 kg (183 lb 1.6 oz)  03/09/14 84.188 kg (185 lb 9.6 oz)  01/23/14 95.255 kg (210 lb)    Physical Exam:  Constitutional: He appears well-developed and well-nourished. He is sleeping. He is easily aroused.  HENT:  Head: Normocephalic.  Neck: supple Cardiovascular: Normal rate and regular rhythm.  Respiratory: Effort normal. No respiratory distress.  GI: Soft. Bowel sounds are normal. He exhibits no distension. There is no tenderness.  Musculoskeletal: He exhibits no edema.  Neurological: He is alert. Good phonation  restless.   Moves all extremities without difficulty. equally moves all 4's. Senses pain in all 4. Oriented to name only. confabulates Skin: Skin is warm and dry.  Psychiatric:  Confused, alert, fairly cooperative.  restless  Assessment/Plan: 1. 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 Physiatrist is providing close team supervision and 24 hour management of active medical problems listed below. Physiatrist and rehab team continue to assess barriers to discharge/monitor patient progress toward functional and medical goals. FIM: FIM - Bathing Bathing Steps Patient Completed: Chest, Abdomen, Front perineal  area, Right Arm, Left Arm, Buttocks, Right upper leg, Left upper leg, Left lower leg (including foot), Right lower leg (including foot) Bathing: 5: Supervision: Safety issues/verbal cues  FIM - Upper Body Dressing/Undressing Upper body dressing/undressing steps patient completed: Thread/unthread right sleeve of pullover shirt/dresss, Thread/unthread left sleeve of pullover shirt/dress, Put head through opening of pull over shirt/dress, Pull shirt over trunk Upper body dressing/undressing: 5: Supervision: Safety issues/verbal cues FIM - Lower Body Dressing/Undressing Lower body dressing/undressing steps patient completed: Thread/unthread right underwear leg, Thread/unthread left underwear leg, Pull underwear up/down, Thread/unthread right pants leg, Thread/unthread left pants leg, Pull pants up/down, Don/Doff right sock, Don/Doff left sock, Don/Doff right shoe, Fasten/unfasten left shoe, Fasten/unfasten right shoe, Don/Doff left shoe Lower body dressing/undressing: 5: Supervision: Safety issues/verbal cues  FIM - Toileting Toileting steps completed by patient: Adjust clothing prior to toileting, Adjust clothing after toileting Toileting Assistive Devices: Grab bar or rail for support Toileting: 5: Supervision: Safety issues/verbal cues  FIM - Diplomatic Services operational officerToilet Transfers Toilet Transfers Assistive Devices: Therapist, musicGrab bars Toilet Transfers: 5-To toilet/BSC: Supervision (verbal cues/safety issues), 5-From toilet/BSC: Supervision (verbal cues/safety issues)  FIM - BankerBed/Chair Transfer Bed/Chair Transfer Assistive Devices: Arm rests Bed/Chair Transfer: 5: Supine > Sit: Supervision (verbal cues/safety issues), 5: Sit > Supine: Supervision (verbal cues/safety issues), 5: Bed > Chair or W/C: Supervision (verbal cues/safety issues), 5: Chair or W/C > Bed: Supervision (verbal cues/safety issues)  FIM - Locomotion: Wheelchair Locomotion: Wheelchair: 0: Activity did not occur FIM - Locomotion: Ambulation Locomotion:  Ambulation Assistive Devices: Other (comment) (none) Ambulation/Gait Assistance: 5: Supervision Locomotion: Ambulation: 1: Travels less than 50 ft with supervision/safety issues  Comprehension Comprehension Mode: Auditory Comprehension: 2-Understands basic  25 - 49% of the time/requires cueing 51 - 75% of the time  Expression Expression Mode: Verbal Expression: 2-Expresses basic 25 - 49% of the time/requires cueing 50 - 75% of the time. Uses single words/gestures.  Social Interaction Social Interaction: 1-Interacts appropriately less than 25% of the time. May be withdrawn or combative.  Problem Solving Problem Solving: 1-Solves basic less than 25% of the time - needs direction nearly all the time or does not effectively solve problems and may need a restraint for safety  Memory Memory: 1-Recognizes or recalls less than 25% of the time/requires cueing greater than 75% of the time  Medical Problem List and Plan: 1. Functional deficits secondary to TBI 2. DVT Prophylaxis/Anticoagulation: Pharmaceutical: Lovenox 3. Pain Management: Will continue oxycodone prn. Will likely need premedication as unable to express needs. Will monitor for now.  4. Mood: Unable to gauge at this time due to cognitive deficits. LCSW to follow along for evaluation and support once more appropriate.  5. Neuropsych: This patient is not capable of making decisions on her own behalf. -continue in vail for safety 6. Skin/Wound Care: Pressure relief measures. Maintain adequate hydration and nutritional status as able.  7. Fluids/Electrolytes/Nutrition:megace stoppped 8. MSSA HCAP: Completed 10 day course of antibiotic regimen 01/17.  9. Reactive Leucocytosis: Resolving.  10. Hypokalemia: Likely due to poor nutritional status as well as dilutional .    11. Agitation:one outburst as far as i could see yesterday. Happened when fatigued  -seroquel - continue Klonopin 0.0.5mg   tid - librium  tid -sleep cycle overall better -Continue vail bed for fall prevention and due to poor insight/awareness-vail soon 12 Activation:  Ritalin stopped due to agitation 13. ?GERD/odynophagia--resolved?   LOS (Days) 16 A FACE TO FACE EVALUATION WAS PERFORMED  Justin Pugh T 03/25/2014 9:06 AM

## 2014-03-25 NOTE — Progress Notes (Signed)
Pt ambulated to bathroom at his 10pm restraint check. Pt took medications without complaints but quickly became restless/agitated. Pt pushed RN and NT Shanda BumpsJessica out of the way and opened the door.Pt attempted to leave the floor to "go find Cathy" Pt was agitated, confused, and combative to nursing staff. Security was called and Pt walked several times through the hallway with security. Pt returned to bed after multiple attempts.   Will continue to monitor closely for safety.

## 2014-03-25 NOTE — Progress Notes (Signed)
Social Work Patient ID: Justin Pugh, male   DOB: [birth year], 57 y.o.   MRN: [id number]   Met with pt's mother following team conference yesterday.  We discussed the behavioral issues that continue with pt and my concerns about how these will affect which facilities I can pursue placement in.   Made her aware that I will be meeting with hospital administration on Thursday morning to get some guidance on options if he remains at this level of agitation.  Explained that, at this level of functioning, I would have to consider a locked unit and possibly in a behavioral health facility.  Will follow up with her further once I have met with administration. She states that she understands the concerns and is appreciative of our help on CIR.  Tanikka Bresnan, LCSW

## 2014-03-25 NOTE — Progress Notes (Signed)
Physical Therapy Note  Patient Details  Name: Justin Pugh MRN: [id number] Date of Birth: October 23, 1955 Today's Date: 03/25/2014  Patient missed 60 minutes of skilled physical therapy this PM secondary to fatigue. Patient asleep on arrival and due to fluctuating levels of agitation throughout day, patient left to sleep in enclosure bed.   Zella RicherBridget S Laquonda Welby S. Naeem Quillin, PT, DPT 03/25/2014, 4:11 PM

## 2014-03-25 NOTE — Progress Notes (Signed)
Occupational Therapy Weekly Progress Note  Patient Details  Name: Justin Pugh MRN: [id number] Date of Birth: March 18, 1955  Beginning of progress report period: March 17, 2014 End of progress report period: March 25, 2014  Today's Date: 03/25/2014 OT Individual Time: 0900-1015 OT Individual Time Calculation (min): 75 min    Patient has met 1 of 4 short term goals.  Patient has made inconsistent gains during this reporting period due to increased levels of agitation during therapy. Patient currently requires supervision for all functional mobility and transfers. Patient inconsistently participates in self-care tasks, requiring max cues for sequencing and task completion. Patient requires max-total A for sustained attention to therapeutic tasks. Therapy team attempted behavioral management plan with patient, however, minimal success due to patient's inability to be redirected and sustain attention. Patient currently demonstrates behaviors consistent with Rancho Level IV.  Patient continues to demonstrate the following deficits: decreased balance, decreased awareness, decreased safety, decreased sequencing, poor frustration tolerance, decreased attention, decreased ability to redirect and de-escalate when agitated, decreased problem solving, decreased activity tolerance, decreased initiation, decreased memory and therefore will continue to benefit from skilled OT intervention to enhance overall performance with BADLs, safety, attention, balance, and overall cognition.  Patient progressing toward long term goals..  Continue plan of care.  OT Short Term Goals Week 2:  OT Short Term Goal 1 (Week 2): Pt will wash 10/10 body parts at supervision level with max multimodal cues OT Short Term Goal 1 - Progress (Week 2): Met OT Short Term Goal 2 (Week 2): Pt will be oriented x2 with max cues  OT Short Term Goal 2 - Progress (Week 2): Not met OT Short Term Goal 3 (Week 2): Pt will demonstrate sustained  attention to functional task for 1 min with max cues  OT Short Term Goal 3 - Progress (Week 2): Other (comment) (pt had met goal however due to agitation over past few days, pt has not met goal) OT Short Term Goal 4 (Week 2): Pt will identify 1 physical or cognitive deficit with max cues OT Short Term Goal 4 - Progress (Week 2): Not met Week 3:  OT Short Term Goal 1 (Week 3): Pt will identify 1 physical or cognitive deficit with max cues OT Short Term Goal 2 (Week 3): Pt will follow behavioral management plan for out of room schedule with therapy 100% of time with max cues OT Short Term Goal 3 (Week 3): Pt will be oriented x2 with max cues  OT Short Term Goal 4 (Week 3): Pt will initiate 1 self-care task with min cues  Skilled Therapeutic Interventions/Progress Updates:    Pt seen for 1:1 OT session with focus on functional mobility, task completion, sustained attention, overall cognitive remediation, and de-escalation. Pt received washing hands at sink with sitter present and agreeable to shower this AM without encouragement. Pt completed bathing at supervision level in standing with mod cues for washing all body parts. Pt completed dressing sitting EOB at supervision level with therapist retrieving clothing. Engaged in card game while engaging in therapeutic conversation. Pt oriented to person and stating he was at "rehab" however thought it was for alcohol. Pt sustained attention to card game for 15 min with mod cues demonstrating 75% accuracy with simple math and adhering to rules of "21" 100% of time. Pt reporting being thirsty, therefore utilized behavioral management technique for leaving room. Pt retrieved coffee at supervision level then initiated returning to room. Pt sitting EOB engaging in conversation with therapist while drinking  coffee then began perseverating on leaving hospital. Able to redirect for short period with max cues then pt began escalating quickly. Attempted to have RN and other  staff member as "new face" to redirect, however pt continued to escalate and began throwing items around room and at wall. RN administered Haldol, however significant agitation continued, therefore called security. Upon arrival of security, pt returned to supine following 15 min and left in enclosure bed to de-escalate.   Therapy Documentation Precautions:  Precautions Precautions: Fall Precaution Comments: Elopement risk Restrictions Weight Bearing Restrictions: No General:   Vital Signs: Therapy Vitals Temp: 97.7 F (36.5 C) Temp Source: Oral BP: 126/86 mmHg Oxygen Therapy SpO2: 100 % Pain:   ADL:   Exercises:   Other Treatments:    See FIM for current functional status  Therapy/Group: Individual Therapy  Duayne Cal 03/25/2014, 6:51 AM

## 2014-03-25 NOTE — Progress Notes (Signed)
Occupational Therapy Note  Patient Details  Name: Justin Pugh MRN: [id number] Date of Birth: 08-25-1955  Today's Date: 03/25/2014 OT Individual Time: 1300-1400 OT Individual Time Calculation (min): 60 min   Pt denied pain Individual Therapy  Pt resting in enclosure bed upon arrival.  Pt requested to get out of bed and this therapist recommended playing a game of cards.  Pt engaged in palying "Crazy 8's" with different variations for approx 35 minutes while carrying on conversation about where patient had lived and worked over the years.  Pt changed rules of game with each start of new hand.  Pt oriented to place but not date or situation.  Pt continued conversation for additional 25 mins, discussing his exwife and daughter (deceased).  Pt directed to sit on edge of bed and place legs inside of enclosure bed.  Enclosure bed secured and sitter returned to room.  Focus on active participation, attention to task, orientation, and safety awareness.   Lavone NeriLanier, Deagen Krass [facility]ell 03/25/2014, 2:38 PM

## 2014-03-25 NOTE — Progress Notes (Signed)
Speech Language Pathology Daily Session Note  Patient Details  Name: Justin Pugh MRN: [id number] Date of Birth: Feb 09, 1956  Today's Date: 03/25/2014 SLP Individual Time: 1130-1215 SLP Individual Time Calculation (min): 45 min and Today's Date: 03/25/2014 SLP Missed Time: 15 Minutes Missed Time Reason: Increased agitation  Short Term Goals: Week 3: SLP Short Term Goal 1 (Week 3): Pt will consume current diet with minimal overt s/s of aspiration with  Mod A multimodal cues for use of swallow strategies  SLP Short Term Goal 2 (Week 3): Pt will sustain attention to basic, familiar task in structured environment for 10 minutes with Max cues SLP Short Term Goal 3 (Week 3): Pt will utilize external aids and environmental cues to demonstrate orientation to place, time and situation with Max cues SLP Short Term Goal 4 (Week 3): Patient will participate in a 30 minute session without verbal or physical agitiation with Max A multimodal cues.  SLP Short Term Goal 5 (Week 3): Pt will follow one-step commands with 80% accuracy throughout basic, functional tasks in structured environment with Max cues  Skilled Therapeutic Interventions: Skilled treatment session focused on dysphagia and cognitive goals. SLP facilitated session by providing skilled observation with lunch meal of Dys. 2 textures with thin liquids via cup. Patient demonstrated oral holding with a suspected delayed swallow initiation with overt coughing and wet vocal quality that cleared with a cued throat clear after Max A multimodal cues. Suspect difficulty with meals is due to effects of medication patient received to decrease agitation.  Patient sustained attention to meal for ~25 minutes with Min A multimodal cues needed for redirection. Patient also participated in a basic calendar task and required total A multimodal cues to self-monitor and correct errors in order to complete task accurately. Patient also required total A for orientation to  time, place and situation and for working memory with functional tasks. Towards end of session, patient began ambulating around the room searching for "papers" and "using the phone" for an "interview he had today." Patient was then placed back into enclosure bed in order to reduce possible escalation of agitation. Continue with current plan of care.     FIM:  Comprehension Comprehension Mode: Auditory Comprehension: 2-Understands basic 25 - 49% of the time/requires cueing 51 - 75% of the time Expression Expression Mode: Verbal Expression: 2-Expresses basic 25 - 49% of the time/requires cueing 50 - 75% of the time. Uses single words/gestures. Social Interaction Social Interaction: 1-Interacts appropriately less than 25% of the time. May be withdrawn or combative. Problem Solving Problem Solving: 1-Solves basic less than 25% of the time - needs direction nearly all the time or does not effectively solve problems and may need a restraint for safety Memory Memory: 1-Recognizes or recalls less than 25% of the time/requires cueing greater than 75% of the time FIM - Eating Eating Activity: 5: Supervision/cues  Pain Pain Assessment Pain Assessment: No/denies pain Pain Score: 0-No pain  Therapy/Group: Individual Therapy  Willy Pinkerton 03/25/2014, 3:12 PM

## 2014-03-25 NOTE — Progress Notes (Signed)
Pt had two episodes of agitation with constant threats to leave. Pt did end up throwing room items at the door. One episode, pt took shower chair and threatened to throw it at RN. Security called twice to assist staff with pt.   At 1430 girlfriend came to visit with pt in enclosure bed and RN in room. GF stayed only 15 minutes with RN walking GF out due to pt becoming highly agitated. Discussed with her management to adjustments to BIs and side effects of BIs.  Recommended GF to not visit until further notice due to agitation and irritability.

## 2014-03-26 ENCOUNTER — Inpatient Hospital Stay (HOSPITAL_COMMUNITY): Payer: Self-pay | Admitting: Physical Therapy

## 2014-03-26 ENCOUNTER — Inpatient Hospital Stay (HOSPITAL_COMMUNITY): Payer: Medicaid Other | Admitting: Speech Pathology

## 2014-03-26 ENCOUNTER — Inpatient Hospital Stay (HOSPITAL_COMMUNITY): Payer: Self-pay | Admitting: Occupational Therapy

## 2014-03-26 ENCOUNTER — Inpatient Hospital Stay (HOSPITAL_COMMUNITY): Payer: Self-pay

## 2014-03-26 MED ORDER — SORBITOL 70 % SOLN
30.0000 mL | Freq: Once | Status: AC
  Administered 2014-03-26: 30 mL via ORAL
  Filled 2014-03-26: qty 30

## 2014-03-26 NOTE — Progress Notes (Signed)
Patient more calm today. Following directions and cooperating with staff with slight anxiety. Hides pens, and game pieces in enclosure bed. Patient with wet cough and wet sound when speaking to staff. adm

## 2014-03-26 NOTE — Progress Notes (Signed)
Speech Language Pathology Daily Session Note  Patient Details  Name: Justin CohoJeffrey L Pugh MRN: [id number] Date of Birth: 1955/10/05  Today's Date: 03/26/2014 SLP Individual Time: 1115-1200 SLP Individual Time Calculation (min): 45 min  Short Term Goals: Week 3: SLP Short Term Goal 1 (Week 3): Pt will consume current diet with minimal overt s/s of aspiration with  Mod A multimodal cues for use of swallow strategies  SLP Short Term Goal 2 (Week 3): Pt will sustain attention to basic, familiar task in structured environment for 10 minutes with Max cues SLP Short Term Goal 3 (Week 3): Pt will utilize external aids and environmental cues to demonstrate orientation to place, time and situation with Max cues SLP Short Term Goal 4 (Week 3): Patient will participate in a 30 minute session without verbal or physical agitiation with Max A multimodal cues.  SLP Short Term Goal 5 (Week 3): Pt will follow one-step commands with 80% accuracy throughout basic, functional tasks in structured environment with Max cues  Skilled Therapeutic Interventions: Skilled treatment session focused on cognitive and dysphagia goals. Upon arrival, patient was awake while sitting upright on EOB and was agreeable to participate in treatment session. Patient participated in a matching game and required extra time and Mod A multimodal cues to identify matching pair, however, attended to task for ~10 minutes. Patient then asked to lay down due to not feeling well, however, asked to go to "the open room" to play a game. Patient ambulated to dayroom with close supervision and attended to "rummy" card game for ~15 minutes and Max A multimodal cues for problem solving/rules to game. Patient was then agreeable to ambulate back to his room at end of task. Patient declined lunch meal but consumed thin and nectar-thick liquids and demonstrated a consistent wet vocal quality with belching that cleared with cued coughs and throat clears. Patient also  able to orally expectorate thick mucous, suspect increased overt s/s of aspiration is due to reflux due to constant intake of liquids and/or food. Discussed limiting fluid intake with patient's sitter, RN and PA. SLP will continue to monitor, however, if overt s/s of aspiration due not improve, patient will likely need to downgrade liquids.    FIM:  Comprehension Comprehension Mode: Auditory Comprehension: 2-Understands basic 25 - 49% of the time/requires cueing 51 - 75% of the time Expression Expression Mode: Verbal Expression: 2-Expresses basic 25 - 49% of the time/requires cueing 50 - 75% of the time. Uses single words/gestures. Social Interaction Social Interaction: 3-Interacts appropriately 50 - 74% of the time - May be physically or verbally inappropriate. Problem Solving Problem Solving: 2-Solves basic 25 - 49% of the time - needs direction more than half the time to initiate, plan or complete simple activities Memory Memory: 1-Recognizes or recalls less than 25% of the time/requires cueing greater than 75% of the time FIM - Eating Eating Activity: 7: Complete independence:no helper  Pain Pain Assessment Pain Assessment: No/denies pain  Therapy/Group: Individual Therapy  Hewitt Garner 03/26/2014, 4:47 PM

## 2014-03-26 NOTE — Plan of Care (Signed)
Problem: RH SAFETY Goal: RH STG ADHERE TO SAFETY PRECAUTIONS W/ASSISTANCE/DEVICE STG Adhere to Safety Precautions With min Assistance/Device.  Outcome: Not Progressing Pt continues to require max cues for safety. Pt can be aggressive and combative with staff at times. Pt continues to need restraints.

## 2014-03-26 NOTE — Progress Notes (Signed)
[place] PHYSICAL MEDICINE & REHABILITATION     PROGRESS NOTE    Subjective/Complaints: Up walking around unit. Intermittent agitation/aggressive behavior ROS limited due to cognition  Objective: Vital Signs: Blood pressure 120/80, pulse 80, temperature 97.7 F (36.5 C), temperature source Oral, resp. rate 15, weight 83.054 kg (183 lb 1.6 oz), SpO2 100 %. No results found. No results for input(s): WBC, HGB, HCT, PLT in the last 72 hours. No results for input(s): NA, K, CL, GLUCOSE, BUN, CREATININE, CALCIUM in the last 72 hours.  Invalid input(s): CO CBG (last 3)  No results for input(s): GLUCAP in the last 72 hours.  Wt Readings from Last 3 Encounters:  03/25/14 83.054 kg (183 lb 1.6 oz)  03/09/14 84.188 kg (185 lb 9.6 oz)  01/23/14 95.255 kg (210 lb)    Physical Exam:  Constitutional: He appears well-developed and well-nourished. He is sleeping. He is easily aroused.  HENT:  Head: Normocephalic.  Neck: supple Cardiovascular: Normal rate and regular rhythm.  Respiratory: Effort normal. No respiratory distress.  GI: Soft. Bowel sounds are normal. He exhibits no distension. There is no tenderness.  Musculoskeletal: He exhibits no edema.  Neurological: He is alert. Good phonation  restless.   Moves all extremities without difficulty. equally moves all 4's. Senses pain in all 4. Oriented to name only. confabulates Skin: Skin is warm and dry.  Psychiatric:  Confused, alert,   Assessment/Plan: 1. 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 Physiatrist is providing close team supervision and 24 hour management of active medical problems listed below. Physiatrist and rehab team continue to assess barriers to discharge/monitor patient progress toward functional and medical goals. FIM: FIM - Bathing Bathing Steps Patient Completed: Chest, Abdomen, Front perineal area,  Right Arm, Left Arm, Buttocks, Right upper leg, Left upper leg, Left lower leg (including foot), Right lower leg (including foot) Bathing: 5: Supervision: Safety issues/verbal cues  FIM - Upper Body Dressing/Undressing Upper body dressing/undressing steps patient completed: Thread/unthread right sleeve of pullover shirt/dresss, Thread/unthread left sleeve of pullover shirt/dress, Put head through opening of pull over shirt/dress, Pull shirt over trunk Upper body dressing/undressing: 5: Supervision: Safety issues/verbal cues FIM - Lower Body Dressing/Undressing Lower body dressing/undressing steps patient completed: Thread/unthread right underwear leg, Thread/unthread left underwear leg, Pull underwear up/down, Thread/unthread right pants leg, Thread/unthread left pants leg, Pull pants up/down, Don/Doff right sock, Don/Doff left sock, Don/Doff right shoe, Fasten/unfasten left shoe, Fasten/unfasten right shoe, Don/Doff left shoe Lower body dressing/undressing: 5: Supervision: Safety issues/verbal cues  FIM - Toileting Toileting steps completed by patient: Adjust clothing prior to toileting, Adjust clothing after toileting Toileting Assistive Devices: Grab bar or rail for support Toileting: 5: Supervision: Safety issues/verbal cues  FIM - Diplomatic Services operational officerToilet Transfers Toilet Transfers Assistive Devices: Therapist, musicGrab bars Toilet Transfers: 5-To toilet/BSC: Supervision (verbal cues/safety issues), 5-From toilet/BSC: Supervision (verbal cues/safety issues)  FIM - BankerBed/Chair Transfer Bed/Chair Transfer Assistive Devices: Arm rests Bed/Chair Transfer: 5: Sit > Supine: Supervision (verbal cues/safety issues), 5: Supine > Sit: Supervision (verbal cues/safety issues), 5: Bed > Chair or W/C: Supervision (verbal cues/safety issues), 5: Chair or W/C > Bed: Supervision (verbal cues/safety issues)  FIM - Locomotion: Wheelchair Locomotion: Wheelchair: 0: Activity did not occur FIM - Locomotion: Ambulation Locomotion: Ambulation  Assistive Devices: Other (comment) (none) Ambulation/Gait Assistance: 5: Supervision Locomotion: Ambulation: 1: Travels less than 50 ft with supervision/safety issues  Comprehension Comprehension Mode: Auditory Comprehension: 2-Understands basic 25 - 49% of the time/requires  cueing 51 - 75% of the time  Expression Expression Mode: Verbal Expression: 2-Expresses basic 25 - 49% of the time/requires cueing 50 - 75% of the time. Uses single words/gestures.  Social Interaction Social Interaction: 1-Interacts appropriately less than 25% of the time. May be withdrawn or combative.  Problem Solving Problem Solving: 1-Solves basic less than 25% of the time - needs direction nearly all the time or does not effectively solve problems and may need a restraint for safety  Memory Memory: 1-Recognizes or recalls less than 25% of the time/requires cueing greater than 75% of the time  Medical Problem List and Plan: 1. Functional deficits secondary to TBI 2. DVT Prophylaxis/Anticoagulation: Pharmaceutical: Lovenox 3. Pain Management: Will continue oxycodone prn. Will likely need premedication as unable to express needs. Will monitor for now.  4. Mood: Unable to gauge at this time due to cognitive deficits. LCSW to follow along for evaluation and support once more appropriate.  5. Neuropsych: This patient is not capable of making decisions on her own behalf. -continue in vail for safety 6. Skin/Wound Care: Pressure relief measures. Maintain adequate hydration and nutritional status as able.  7. Fluids/Electrolytes/Nutrition:megace stoppped 8. MSSA HCAP: Completed 10 day course of antibiotic regimen 01/17.  9. Reactive Leucocytosis: Resolving.  10. Hypokalemia: Likely due to poor nutritional status as well as dilutional .    11. Agitation:one outburst as far as i could see yesterday. Happened when fatigued  -seroquel - continue Klonopin 0.0.5mg   tid - librium  tid -sleep cycle overall better -Continue vail bed for fall prevention and due to poor insight/awareness-vail soon 12 Activation:  Ritalin stopped due to agitation 13. ?GERD/odynophagia--resolved?   LOS (Days) 17 A FACE TO FACE EVALUATION WAS PERFORMED  Amya Hlad T 03/26/2014 9:04 AM

## 2014-03-26 NOTE — Progress Notes (Signed)
Occupational Therapy Session Note  Patient Details  Name: Katrine CohoJeffrey L Poole MRN: [id number] Date of Birth: [birth year]  Today's Date: 03/26/2014 OT Individual Time: 1400-1445 OT Individual Time Calculation (min): 45 min    Skilled Therapeutic Interventions/Progress Updates:    Pt began session in the dayroom with sitter present.  He was initially pleasant and conversational with therapist while drinking his coffee.  Noted pt with gargled wet speech so encouraged him to clear is throat.  Noted throughout beginning of session he would exhibit coughing episodes with each swallow.  Eventually the therapist asked if he could take the coffee, as it was making Mr. Manson PasseyBrown cough. Therapist asked pt what he liked to do and the pt stated, " play cards".  Therapist had instructed pt to teach him a game the pt stated was "yahtzee" using playing cards.  Instructed pt to go with therapist to get playing cards and then go back to the dayroom.  Once therapist provided the playing cards the pt was unable to find his way back to the dayroom without max instructional cueing.  He was able to start the game but could not explain all the rules as therapist would ask.  He varied in his ability to instruct the therapist how to play and eventually the pt began tired of the task and stopped the game, stating he had to go meet someone.  Had pt take cards back to where he found them.  He needed mod instructional cueing for location.  Therapist kept re-directing pt with questions about his previous jobs and likes.  He had to be re-directed away from the emergency exit as he was attempting to "go meet someone"  Eventually therapist had him return to room however his frustration increased.  Once in the room he stated he wanted this therapist to go and that he wasn't answering any more questions.  Therapist attempted to discuss pt's reason for being in the hospital and having therapy but he was unable to comprehend secondary to internal  distractions.  He became more agitated when therapist re-directed him to not leave the room and threw his sweat shirt in the floor while stating profanity.  Therapist attempted to re-direct but pt returned to his vail bed and stated he was not answering any more questions.  Nursing and sitter in the room.  He missed 15 mins secondary to not being able to be re-directed.    Therapy Documentation Precautions:  Precautions Precautions: Fall Precaution Comments: Elopement risk, agitation at times Restrictions Weight Bearing Restrictions: No General: General OT Amount of Missed Time: 15 Minutes  Pain: Pain Assessment Pain Assessment: No/denies pain  See FIM for current functional status  Therapy/Group: Individual Therapy  Azariah Latendresse OTR/L 03/26/2014, 4:00 PM

## 2014-03-26 NOTE — Progress Notes (Signed)
Physical Therapy Session Note  Patient Details  Name: Justin Pugh MRN: [id number] Date of Birth: 1955/11/19  Today's Date: 03/26/2014 PT Individual Time: 0954-1030 PT Individual Time Calculation (min): 36 min   Short Term Goals: Week 2:  PT Short Term Goal 1 (Week 2): STGs=LTGs  Skilled Therapeutic Interventions/Progress Updates:    Pt received side lying in enclosure bed with sitter present. Pt asleep but easily aroused, requesting to begin therapy 15 minutes after scheduled start time. Upon returning 15 minutes later, pt agreeable participate without coaxing. Session focused on cognitive remediation with emphasis on sustained attention and problem solving. Majority of session occurred in minimally distracting environment (uncrowded dayroom). Pt engaged in card game x16 minutes total with redirection every 3-5 minutes due to pt tendency to make negative comments about nearby staff members/patients. Pt then engaged in game of Connect Four x11 consecutive minutes prior to pt stating being "tired of that game". Pt ambulated x160' in controlled environment with distant supervision prior to returning to room, where pt was left lying in secured enclosure bed with sitter present, all needs within reach.  Therapy Documentation Precautions:  Precautions Precautions: Fall Precaution Comments: Elopement risk Restrictions Weight Bearing Restrictions: No Vital Signs: Therapy Vitals Pulse Rate: 80 BP: 120/80 mmHg Pain: Pain Assessment Pain Assessment: No/denies pain Locomotion : Ambulation Ambulation/Gait Assistance: 5: Supervision   See FIM for current functional status  Therapy/Group: Individual Therapy  Jemima Petko, Lorenda IshiharaBlair A 03/26/2014, 10:44 AM

## 2014-03-26 NOTE — Progress Notes (Signed)
Occupational Therapy Session Note  Patient Details  Name: Justin Pugh MRN: [id number] Date of Birth: 09-06-1955  Today's Date: 03/26/2014 OT Individual Time: 1300-1345 OT Individual Time Calculation (min): 45 min   Short Term Goals: Week 3:  OT Short Term Goal 1 (Week 3): Pt will identify 1 physical or cognitive deficit with max cues OT Short Term Goal 2 (Week 3): Pt will follow behavioral management plan for out of room schedule with therapy 100% of time with max cues OT Short Term Goal 3 (Week 3): Pt will be oriented x2 with max cues  OT Short Term Goal 4 (Week 3): Pt will initiate 1 self-care task with min cues  Skilled Therapeutic Interventions/Progress Updates: Therapeutic activity with focus on improved orientation, sustained attention and improved cognitive awareness.    Pt received in enclosure bed with sitter present.   Pt re-oriented to situation and reported desire to leave bed and facility.   With mod vc to redirect, pt was escorted to day room and accepted invitation for game play (cards and Dorisann FramesYahtzee) with rest breaks to discuss topics of interest. before ceasing gameplay and initiating a new task (dealing cards).   Pt sustained attention to task of McNairahtzee game for approximately 10 min before initiating new activity (dealing cards) although ignoring attempts at defining game play rules or instructions.    During game play pt perseverated on computing total value of dice (incorrectly) but demonstrated ability to tabulate two number sets correctly 1 time with pencil and paper.   Pt denied cognitive deficits and was unable to identify a problem resulting in his admission.  Pt perseverated on wanting to call his mother and on returning to work but accepted redirection w/o incident 3 times during session.    Pt was escorted back to his room to enclosure bed; RN notified of need for resumption of supervision.     Therapy Documentation Precautions:  Precautions Precautions:  Fall Precaution Comments: Elopement risk Restrictions Weight Bearing Restrictions: No  Pain:  No/denies pain  See FIM for current functional status  Therapy/Group: Individual Therapy  Jolyssa Oplinger 03/26/2014, 2:02 PM

## 2014-03-26 NOTE — Progress Notes (Signed)
Patient unable to tolerate swallowing food on dinner tray with Dys I diet/ thin liquids. Patient able to tolerate water, and milk.  Patient given magic cup instead of eating dinner tray. Patient began coughing and unable to tolerate swallowing magic cup. Speech therapy notified. Marissa NestlePam Love, PA gave orders for water, milk, and ensure. adm

## 2014-03-26 NOTE — Progress Notes (Signed)
Physical Therapy Session Note  Patient Details  Name: Justin Pugh MRN: [id number] Date of Birth: 05/27/1955  Today's Date: 03/26/2014 PT Individual Time: 0753-0857 PT Individual Time Calculation (min): 64 min   Short Term Goals: Week 3:  PT Short Term Goal 1 (Week 3): STGs=LTGs      Therapy Documentation Precautions:  Precautions Precautions: Fall Precaution Comments: Elopement risk Restrictions Weight Bearing Restrictions: No Pain: Pain Assessment Pain Assessment: No/denies pain     Patient received supine in enclosure bed resting comfortably. Patient easily aroused and pleasant at arousal as well. Passive orientation provided to patient at beginning of session. Attempt to engage patient in structure task of morning routine however patient declined and unable to re-direct. Discussed with patient structure task of stair negotiation  that needed to occur during session. Patient verbalized and understanding and agreed to participate. Patient perseverative on coffee this am. Patient ambulated from room to dayroom with distant supervision approximately 75 feet. Verbal cues required for way-finding and topographical orientation.  Patient able to prepare coffee independently and transport items to table without incident. Patient able to tolerate this therapist present and engage in unstructured conversation. Patient received breakfast tray. Engaged patient in orientation questions however patient was unable to recall to previous conversation. Increased frustration noted with structured orientation conversation. Patient easily re-directed.  Patient to recall there was a focus on his therapy session but unable to recall task. Patient ambulated from dayroom to therapy gym approximately  200 feet without incidence distant supervision no LOB noted and performed stair negotiation with distant supervision for 13 steps with utilization of left handrail. Patient reports increased fatigue after activity.  Attempted to engage patient in active rest and orientation. Patient unable to maintain attention and restlessness increased. Patient perseverative on finding girlfriend and mother throughout session and ambulated 75-300 feet throughout session and engaged in light conversation.  One attempt noted for emergency exit however was easily re-directed. Patient presented with increased agitation one incidence when asked to clean-up breakfast. Distant supervision provided no evidence of LOB. Patient completed tasked as asked despite increased agitation and this subsided once task was complete. Patient returned to enclosure bed at end of session resting comfortably without incidence 1:1 sitter present at end of session.  Patient thanked therapist at end of session.  Patient tolerated session well able to engage and tolerate minimal structure activities.   Therapy/Group: Individual Therapy  Merri RayWISHART,Bernestine Holsapple J 03/26/2014, 10:59 AM

## 2014-03-27 ENCOUNTER — Inpatient Hospital Stay (HOSPITAL_COMMUNITY): Payer: Medicaid Other

## 2014-03-27 ENCOUNTER — Inpatient Hospital Stay (HOSPITAL_COMMUNITY): Payer: Medicaid Other | Admitting: *Deleted

## 2014-03-27 ENCOUNTER — Inpatient Hospital Stay (HOSPITAL_COMMUNITY): Payer: Self-pay | Admitting: *Deleted

## 2014-03-27 ENCOUNTER — Encounter (HOSPITAL_COMMUNITY): Payer: Self-pay

## 2014-03-27 ENCOUNTER — Inpatient Hospital Stay (HOSPITAL_COMMUNITY): Payer: Self-pay

## 2014-03-27 DIAGNOSIS — K219 Gastro-esophageal reflux disease without esophagitis: Secondary | ICD-10-CM | POA: Diagnosis present

## 2014-03-27 DIAGNOSIS — R451 Restlessness and agitation: Secondary | ICD-10-CM | POA: Diagnosis present

## 2014-03-27 DIAGNOSIS — F10230 Alcohol dependence with withdrawal, uncomplicated: Secondary | ICD-10-CM

## 2014-03-27 DIAGNOSIS — S065X0S Traumatic subdural hemorrhage without loss of consciousness, sequela: Secondary | ICD-10-CM

## 2014-03-27 LAB — BASIC METABOLIC PANEL
Anion gap: 6 (ref 5–15)
BUN: 7 mg/dL (ref 6–23)
CHLORIDE: 103 mmol/L (ref 96–112)
CO2: 27 mmol/L (ref 19–32)
Calcium: 9.8 mg/dL (ref 8.4–10.5)
Creatinine, Ser: 0.83 mg/dL (ref 0.50–1.35)
GLUCOSE: 107 mg/dL — AB (ref 70–99)
Potassium: 4.3 mmol/L (ref 3.5–5.1)
SODIUM: 136 mmol/L (ref 135–145)

## 2014-03-27 LAB — CBC WITH DIFFERENTIAL/PLATELET
Basophils Absolute: 0 10*3/uL (ref 0.0–0.1)
Basophils Relative: 0 % (ref 0–1)
EOS ABS: 0.3 10*3/uL (ref 0.0–0.7)
Eosinophils Relative: 2 % (ref 0–5)
HCT: 42 % (ref 39.0–52.0)
Hemoglobin: 14.3 g/dL (ref 13.0–17.0)
Lymphocytes Relative: 17 % (ref 12–46)
Lymphs Abs: 2.2 10*3/uL (ref 0.7–4.0)
MCH: 32.8 pg (ref 26.0–34.0)
MCHC: 34 g/dL (ref 30.0–36.0)
MCV: 96.3 fL (ref 78.0–100.0)
MONOS PCT: 10 % (ref 3–12)
Monocytes Absolute: 1.3 10*3/uL — ABNORMAL HIGH (ref 0.1–1.0)
Neutro Abs: 9.4 10*3/uL — ABNORMAL HIGH (ref 1.7–7.7)
Neutrophils Relative %: 71 % (ref 43–77)
PLATELETS: 279 10*3/uL (ref 150–400)
RBC: 4.36 MIL/uL (ref 4.22–5.81)
RDW: 12.5 % (ref 11.5–15.5)
WBC: 13.3 10*3/uL — AB (ref 4.0–10.5)

## 2014-03-27 MED ORDER — QUETIAPINE FUMARATE 200 MG PO TABS
200.0000 mg | ORAL_TABLET | Freq: Two times a day (BID) | ORAL | Status: DC
  Administered 2014-03-28 – 2014-04-07 (×18): 200 mg via ORAL
  Filled 2014-03-27 (×25): qty 1

## 2014-03-27 MED ORDER — LEVOFLOXACIN 500 MG PO TABS
500.0000 mg | ORAL_TABLET | Freq: Every day | ORAL | Status: DC
  Administered 2014-03-27 – 2014-04-04 (×8): 500 mg via ORAL
  Filled 2014-03-27 (×10): qty 1

## 2014-03-27 MED ORDER — QUETIAPINE FUMARATE 200 MG PO TABS
200.0000 mg | ORAL_TABLET | ORAL | Status: AC
  Administered 2014-03-27: 200 mg via ORAL
  Filled 2014-03-27: qty 1

## 2014-03-27 NOTE — Progress Notes (Signed)
Speech Language Pathology Daily Session Note  Patient Details  Name: Justin CohoJeffrey L Pugh MRN: [id number] Date of Birth: [birth year]  Today's Date: 03/27/2014 SLP Individual Time: 1105-1130 SLP Individual Time Calculation (min): 25 min  Short Term Goals: Week 3: SLP Short Term Goal 1 (Week 3): Pt will consume current diet with minimal overt s/s of aspiration with  Mod A multimodal cues for use of swallow strategies  SLP Short Term Goal 2 (Week 3): Pt will sustain attention to basic, familiar task in structured environment for 10 minutes with Max cues SLP Short Term Goal 3 (Week 3): Pt will utilize external aids and environmental cues to demonstrate orientation to place, time and situation with Max cues SLP Short Term Goal 4 (Week 3): Patient will participate in a 30 minute session without verbal or physical agitiation with Max A multimodal cues.  SLP Short Term Goal 5 (Week 3): Pt will follow one-step commands with 80% accuracy throughout basic, functional tasks in structured environment with Max cues  Skilled Therapeutic Interventions: Skilled treatment session focused on dysphagia goals. Upon arrival, patient was supine in enclosure bed and was agreeable to participate in treatment session.  SLP facilitated session by providing trials of Dys. 1 textures, thin liquids and nectar-thick liquids via cup due to continued and inconsistent difficulty with swallowing since 03/25/14.  Patient demonstrated an intermittent wet vocal quality with all trials that cleared with cued throat clears.  Intermittent wet vocal quality was reduced with use of small bites/sips. Recommend patient continue Dys. 1 textures and downgrade to nectar-thick liquids.   Of note, SLP provided skilled observation of patient with lunch meal of Dys. 1 textures. Patient demonstrated overt and explosive coughing episodes and reported discomfort and appeared SOB.  Recommend patient change to NPO at this time due to consistent overt s/s of  aspiration. Unsure of source of increased swallowing difficulty this week due to no other obvious functional changes. Attempted to schedule a MBS today, however, no time slots available, therefore, patient scheduled to have a MBS at 0900 on 03/28/14.    FIM:  Comprehension Comprehension Mode: Auditory Comprehension: 2-Understands basic 25 - 49% of the time/requires cueing 51 - 75% of the time Expression Expression Mode: Verbal Expression: 2-Expresses basic 25 - 49% of the time/requires cueing 50 - 75% of the time. Uses single words/gestures. Social Interaction Social Interaction: 2-Interacts appropriately 25 - 49% of time - Needs frequent redirection. Problem Solving Problem Solving: 2-Solves basic 25 - 49% of the time - needs direction more than half the time to initiate, plan or complete simple activities Memory Memory: 1-Recognizes or recalls less than 25% of the time/requires cueing greater than 75% of the time FIM - Eating Eating Activity: 5: Supervision/cues  Pain Pain Assessment Pain Assessment: No/denies pain Pain Score: 0-No pain  Therapy/Group: Individual Therapy  Ashvik Grundman 03/27/2014, 1:39 PM

## 2014-03-27 NOTE — Progress Notes (Signed)
Pt became agitated this afternoon at 1700 and stated he was leaving. Would not return to room upon redirection of Recruitment consultantsafety sitter and staff. Security called for assistance. Requested that this RN give 2mg  Haldol by IM injection in Left ventrogluteal muscle. Pt did not resist medication or RN giving medication. Safety sitter and RN assisted Pt back into enclosure bed with Pt cooperation. Will continue to monitor.

## 2014-03-27 NOTE — Progress Notes (Addendum)
[place] PHYSICAL MEDICINE & REHABILITATION     PROGRESS NOTE    Subjective/Complaints: Slept fairly well. Confusion persists. Less agitated ROS limited due to cognition  Objective: Vital Signs: Blood pressure 109/86, pulse 87, temperature 97.4 F (36.3 C), temperature source Oral, resp. rate 17, weight 83.1 kg (183 lb 3.2 oz), SpO2 100 %. No results found. No results for input(s): WBC, HGB, HCT, PLT in the last 72 hours. No results for input(s): NA, K, CL, GLUCOSE, BUN, CREATININE, CALCIUM in the last 72 hours.  Invalid input(s): CO CBG (last 3)  No results for input(s): GLUCAP in the last 72 hours.  Wt Readings from Last 3 Encounters:  03/27/14 83.1 kg (183 lb 3.2 oz)  03/09/14 84.188 kg (185 lb 9.6 oz)  01/23/14 95.255 kg (210 lb)    Physical Exam:  Constitutional: He appears well-developed and well-nourished. He is sleeping. He is easily aroused.  HENT:  Head: Normocephalic.  Neck: supple Cardiovascular: Normal rate and regular rhythm.  Respiratory: Effort normal. No respiratory distress.  GI: Soft. Bowel sounds are normal. He exhibits no distension. There is no tenderness.  Musculoskeletal: He exhibits no edema.  Neurological: He is alert. Good phonation  restless.   Moves all extremities without difficulty. equally moves all 4's. Senses pain in all 4. Oriented to name only. confabulates Skin: Skin is warm and dry.  Psychiatric:  Confused, alert,   Assessment/Plan: 1. 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 Physiatrist is providing close team supervision and 24 hour management of active medical problems listed below. Physiatrist and rehab team continue to assess barriers to discharge/monitor patient progress toward functional and medical goals. FIM: FIM - Bathing Bathing Steps Patient Completed: Chest, Abdomen, Front perineal area, Right Arm, Left Arm,  Buttocks, Right upper leg, Left upper leg, Left lower leg (including foot), Right lower leg (including foot) Bathing: 5: Supervision: Safety issues/verbal cues  FIM - Upper Body Dressing/Undressing Upper body dressing/undressing steps patient completed: Thread/unthread right sleeve of pullover shirt/dresss, Thread/unthread left sleeve of pullover shirt/dress, Put head through opening of pull over shirt/dress, Pull shirt over trunk Upper body dressing/undressing: 5: Supervision: Safety issues/verbal cues FIM - Lower Body Dressing/Undressing Lower body dressing/undressing steps patient completed: Thread/unthread right underwear leg, Thread/unthread left underwear leg, Pull underwear up/down, Thread/unthread right pants leg, Thread/unthread left pants leg, Pull pants up/down, Don/Doff right sock, Don/Doff left sock, Don/Doff right shoe, Fasten/unfasten left shoe, Fasten/unfasten right shoe, Don/Doff left shoe Lower body dressing/undressing: 5: Supervision: Safety issues/verbal cues  FIM - Toileting Toileting steps completed by patient: Adjust clothing prior to toileting, Adjust clothing after toileting Toileting Assistive Devices: Grab bar or rail for support Toileting: 6: More than reasonable amount of time  FIM - Diplomatic Services operational officerToilet Transfers Toilet Transfers Assistive Devices: Grab bars Toilet Transfers: 6-More than reasonable amt of time  FIM - BankerBed/Chair Transfer Bed/Chair Transfer Assistive Devices: Arm rests Bed/Chair Transfer: 5: Sit > Supine: Supervision (verbal cues/safety issues), 5: Supine > Sit: Supervision (verbal cues/safety issues), 5: Bed > Chair or W/C: Supervision (verbal cues/safety issues), 5: Chair or W/C > Bed: Supervision (verbal cues/safety issues)  FIM - Locomotion: Wheelchair Locomotion: Wheelchair: 0: Activity did not occur FIM - Locomotion: Ambulation Locomotion: Ambulation Assistive Devices: Other (comment) (none) Ambulation/Gait Assistance: 5: Supervision Locomotion:  Ambulation: 2: Travels 50 - 149 ft with supervision/safety issues  Comprehension Comprehension Mode: Auditory Comprehension: 2-Understands basic 25 - 49% of the time/requires cueing 51 - 75%  of the time  Expression Expression Mode: Verbal Expression: 2-Expresses basic 25 - 49% of the time/requires cueing 50 - 75% of the time. Uses single words/gestures.  Social Interaction Social Interaction: 3-Interacts appropriately 50 - 74% of the time - May be physically or verbally inappropriate.  Problem Solving Problem Solving: 2-Solves basic 25 - 49% of the time - needs direction more than half the time to initiate, plan or complete simple activities  Memory Memory: 1-Recognizes or recalls less than 25% of the time/requires cueing greater than 75% of the time  Medical Problem List and Plan: 1. Functional deficits secondary to TBI 2. DVT Prophylaxis/Anticoagulation: Pharmaceutical: Lovenox 3. Pain Management: Will continue oxycodone prn. Will likely need premedication as unable to express needs. Will monitor for now.  4. Mood: Unable to gauge at this time due to cognitive deficits. LCSW to follow along for evaluation and support once more appropriate.  5. Neuropsych: This patient is not capable of making decisions on her own behalf. -continue in vail for safety 6. Skin/Wound Care: Pressure relief measures. Maintain adequate hydration and nutritional status as able.  7. Fluids/Electrolytes/Nutrition:megace stoppped 8. MSSA HCAP: Completed 10 day course of antibiotic regimen 01/17.  9. Reactive Leucocytosis: Resolving.  10. Hypokalemia: Likely due to poor nutritional status as well as dilutional .    11. Agitation: still confused,inappropriate but agitation better.  -seroquel - continue Klonopin 0.0.5mg  tid - librium  tid -sleep cycle overall better -Continue vail bed for fall prevention and due to poor  insight/awareness-vail soon 12 Activation:  Ritalin stopped due to agitation 13. ?GERD/odynophagia--pool tolerance of D1 with SLP---MBS today or tomorrow  -continue NPO for now   LOS (Days) 18 A FACE TO FACE EVALUATION WAS PERFORMED  SWARTZ,ZACHARY T 03/27/2014 8:20 AM
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
## 2014-03-27 NOTE — Progress Notes (Signed)
Occupational Therapy Session Note  Patient Details  Name: JENSEN KILBURG MRN: [id number] Date of Birth: [birth year]  Today's Date: 03/27/2014 OT Individual Time:  -   [phone]  (45 min)       Short Term Goals: Week 1:  OT Short Term Goal 1 (Week 1): Pt will demonstrate sustained attention to self-care task for 1 min with mod cues OT Short Term Goal 1 - Progress (Week 1): Met OT Short Term Goal 2 (Week 1): Pt will consistently be oriented to x3 with max cues OT Short Term Goal 2 - Progress (Week 1): Not met OT Short Term Goal 3 (Week 1): Pt will complete self-care task in standing for 1 min with min assist for balance OT Short Term Goal 3 - Progress (Week 1): Met OT Short Term Goal 4 (Week 1): Pt will complete bathing task with min assist and mod cues for sequencing and attention OT Short Term Goal 4 - Progress (Week 1): Progressing toward goal Week 2:  OT Short Term Goal 1 (Week 2): Pt will wash 10/10 body parts at supervision level with max multimodal cues OT Short Term Goal 1 - Progress (Week 2): Met OT Short Term Goal 2 (Week 2): Pt will be oriented x2 with max cues  OT Short Term Goal 2 - Progress (Week 2): Not met OT Short Term Goal 3 (Week 2): Pt will demonstrate sustained attention to functional task for 1 min with max cues  OT Short Term Goal 3 - Progress (Week 2): Other (comment) (pt had met goal however due to agitation over past few days, pt has not met goal) OT Short Term Goal 4 (Week 2): Pt will identify 1 physical or cognitive deficit with max cues OT Short Term Goal 4 - Progress (Week 2): Not met  Skilled Therapeutic Interventions/Progress Updates:    Pt walking around in room upon OT arrival.  Asked pt why he was hear.  He reported he was homeless.  He stated [address] was a bad place.  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  Instructed pt to take a shower and put on  clothes.  He threw down the shoe box and voiced frustration and profanity.  He went to shower, undressed, and bathed in standing in shower stall.  He attended to bathing and dressing for 15 minutes in quiet environment.  He began perserverating the next 15 minutes on going to get something to eat, and going to hold out his sign while it was light.  Threw down the shoe box in frustration when the door was blocked to keep him from leaving.  Redirected him to go to his bed and take a time out.  He sat on the bed still voicing frustration about his circumstances.  Nursing and Psych MD entered room.  Left with Nursing and MD in room.   Therapy Documentation Precautions:  Precautions Precautions: Fall Precaution Comments: Elopement risk, agitation at times Restrictions Weight Bearing Restrictions: No    Pain:   None voiced       Therapy/Group: Individual Therapy  Lisa Roca 03/27/2014, 9:29 AM

## 2014-03-27 NOTE — Progress Notes (Signed)
Occupational Therapy Session Note  Patient Details  Name: Justin CASHER MRN: [id number] Date of Birth: 12/19/1955  Today's Date: 03/27/2014 OT Individual Time: 0930-1007 and 1300-1330 OT Individual Time Calculation (min): 37 min and 30 min  and Today's Date: 03/27/2014 OT Missed Time: 23 Minutes and 30 min  Missed Time Reason: Other (comment) (de-escalation)   Short Term Goals: Week 2:  OT Short Term Goal 1 (Week 2): Pt will wash 10/10 body parts at supervision level with max multimodal cues OT Short Term Goal 1 - Progress (Week 2): Met OT Short Term Goal 2 (Week 2): Pt will be oriented x2 with max cues  OT Short Term Goal 2 - Progress (Week 2): Not met OT Short Term Goal 3 (Week 2): Pt will demonstrate sustained attention to functional task for 1 min with max cues  OT Short Term Goal 3 - Progress (Week 2): Other (comment) (pt had met goal however due to agitation over past few days, pt has not met goal) OT Short Term Goal 4 (Week 2): Pt will identify 1 physical or cognitive deficit with max cues OT Short Term Goal 4 - Progress (Week 2): Not met  Skilled Therapeutic Interventions/Progress Updates:    Session 1: Pt seen for 1:1 OT session with focus on sustained attention, way finding, functional mobility, and overall tolerance of therapist being present. Pt received in enclosure bed requesting to play cards in day room. Utilized behavioral management technique to complete task outside of room. Engaged in game of "rummy" per pt's choice with emphasis on pt educating therapist on rules of game. Pt playing combination of rummy and uno, declining correction from therapist. Played cards approx 20 min while engaging in therapeutic conversation. Pt oriented to person and place. Pt requesting to toilet, requiring mod cues for locating room. Completed toileting at distant supervision level then initiated hand hygiene. Pt able to locate cards in game room with min cues for way finding. Engaged in 4 more  games with pt asking questions about direction of roads from hospital then began looking around as if paranoid. Pt able to be distracted with mod cues and requested to complete oral care. Located room with mod cues and completed task. Pt began looking through items and requesting phone (signs that pt is beginning to escalate). Allowed pt to ambulate about room at supervision level then able to be re-directed to bed with max cues. Pt left supine in enclosure bed to avoid escalation. Sitter present. Noted pt with increased coughing this AM with no food or water present.  Session 2: Pt seen for 1:1 OT session with focus on sustain attention, floor transfers, functional mobility, and de-escalation. Pt received ambulating about room packing items and requesting to leave. Pt attempting to manipulate therapist to allow him to go to first floor to "sign papers" before returning. Re-directed pt with max cues then pt frustrated by "dirty floor." Pt began cleaning floor on hands and knees for approx 5 min, completing floor transfer 3x at supervision level. Pt again perseverating on leaving room with all belonging. Attempted behavioral management technique of leaving room without items, however pt declined and demonstrated escalating behaviors of throwing items on floor and cursing. With max cues, therapist able to re-direct pt to return to enclosure bed for de-escalation.   Therapy Documentation Precautions:  Precautions Precautions: Fall Precaution Comments: Elopement risk, agitation at times Restrictions Weight Bearing Restrictions: No General: General OT Amount of Missed Time: 23 Minutes Vital Signs: Therapy Vitals Temp: 97.4  F (36.3 C) Temp Source: Oral Pulse Rate: 87 Resp: 17 BP: 109/86 mmHg Patient Position (if appropriate): Sitting Oxygen Therapy SpO2: 100 % O2 Device: Not Delivered Pain:  No report of pain  See FIM for current functional status  Therapy/Group: Individual  Therapy  Lauree Yurick, Quillian Quince 03/27/2014, 10:16 AM

## 2014-03-27 NOTE — Progress Notes (Signed)
Nursing Note: Pt has been cooperative.Talkative,rambles,confused and at times verbally inappropriate ,[at times of a sexual nature w/ communication].Slept well.Awakned a few times but did go back to sleep.Sitter at the bedside.wbb

## 2014-03-27 NOTE — Progress Notes (Signed)
Pt with increased difficulties handling PO. Struggled with D1 and all liquid consistencies, doing better with sips. Now NPO--will allow to have meds crushed with puree. CXR with likely RML pneumonia. Check labs.  Will start po levaquin as IV will be difficult to keep given cognitive behavioral status. MBS tomorrow with SLP. Pt is afebrile and appears fairly comfortable at present.    Ranelle OysterZachary T. Swartz, MD, [facility]APMR [facility] 03/27/2014

## 2014-03-27 NOTE — Progress Notes (Signed)
Physical Therapy Session Note  Patient Details  Name: Justin Pugh MRN: [id number] Date of Birth: 01-31-1956  Today's Date: 03/27/2014 PT Individual Time: 1130-1142 and 1400-1431 PT Individual Time Calculation (min): 12 min and 31 min  Short Term Goals: Week 3:  PT Short Term Goal 1 (Week 3): STGs=LTGs  Skilled Therapeutic Interventions/Progress Updates:    AM Session: Patient received from SLP sitting EOB. Patient appears pleasant and cooperative and therefore initiated negotiation/rewards system. Patient agreeable to perform designated tasks of functional ambulation out of room, negotiation of 2 flights of stairs with one handrail, and car transfer with simulated car; all performed with distant supervision. Patient requires min cues for redirection when attempting to get on elevator. Patient demonstrating increasing agitation with cursing, less pleasant demeanor, etc. Additionally, patient becoming manipulative, stating, "I need to see a man to perform an assessment on the first floor." Patient redirected back to room with min cues/coaxing and left in enclosure bed.  PM Session: Patient received sitting EOB. Session focused on therapeutic conversation, intellectual awareness, safety awareness, insight into deficits, and de-escalation techniques. Patient perseverative on "seeing doctor on the first floor" and with multiple attempts to leave room, attempting to push therapist aside to get to door. Patient able to be redirected with firm talking and deal of returning to bed if mother is called. Patient eventually returned to enclosure bed and therapist called patient's mother with patient recalling phone number. Patient's mother not home and patient wanting to call again. Patient left supine in enclosure bed with lights off to promote de-escalation.  Therapy Documentation Precautions:  Precautions Precautions: Fall Precaution Comments: Elopement risk, agitation at times Restrictions Weight  Bearing Restrictions: No General: PT Amount of Missed Time (min): 18 Minutes; 29 minutes PT Missed Treatment Reason: Increased agitation Pain: Pain Assessment Pain Assessment: No/denies pain Pain Score: 0-No pain Locomotion : Ambulation Ambulation/Gait Assistance: 5: Supervision   See FIM for current functional status  Therapy/Group: Individual Therapy  Chipper HerbBridget S Lakeva Hollon S. Jase Reep, PT, DPT 03/27/2014, 12:08 PM

## 2014-03-28 ENCOUNTER — Inpatient Hospital Stay (HOSPITAL_COMMUNITY): Payer: Medicaid Other

## 2014-03-28 ENCOUNTER — Inpatient Hospital Stay (HOSPITAL_COMMUNITY): Payer: Medicaid Other | Admitting: *Deleted

## 2014-03-28 DIAGNOSIS — J690 Pneumonitis due to inhalation of food and vomit: Secondary | ICD-10-CM

## 2014-03-28 NOTE — Progress Notes (Signed)
[facility]     PROGRESS NOTE    Subjective/Complaints: No cough , denies SOB ROS limited due to cognition  Objective: Vital Signs: Blood pressure 111/79, pulse 80, temperature 99.1 F (37.3 C), temperature source Oral, resp. rate 20, weight 83.1 kg (183 lb 3.2 oz), SpO2 96 %. Dg Chest Port 1 View  03/27/2014   CLINICAL DATA:  59 year old with cough of unknown duration. Initial encounter.  EXAM: PORTABLE CHEST - 1 VIEW  COMPARISON:  None.  FINDINGS: 1315 hr. There is focal perihilar airspace disease on the right suspicious for pneumonia. The left lung is clear. There is no pleural effusion or pneumothorax. The heart size and mediastinal contours are normal. No acute osseous findings are seen. There are degenerative changes of the acromioclavicular joints bilaterally.  IMPRESSION: Right perihilar infiltrate consistent with pneumonia. This requires radiographic follow up to exclude an underlying mass lesion.   Electronically Signed   By: Roxy HorsemanBill  Veazey M.D.   On: 03/27/2014 13:29    Recent Labs  03/27/14 1638  WBC 13.3*  HGB 14.3  HCT 42.0  PLT 279    Recent Labs  03/27/14 1638  NA 136  K 4.3  CL 103  GLUCOSE 107*  BUN 7  CREATININE 0.83  CALCIUM 9.8   CBG (last 3)  No results for input(s): GLUCAP in the last 72 hours.  Wt Readings from Last 3 Encounters:  03/27/14 83.1 kg (183 lb 3.2 oz)  03/09/14 84.188 kg (185 lb 9.6 oz)  01/23/14 95.255 kg (210 lb)    Physical Exam:  Constitutional: He appears well-developed and well-nourished. He is sleeping. He is easily aroused.  HENT:  Head: Normocephalic.  Neck: supple Cardiovascular: Normal rate and regular rhythm.  Respiratory: Effort normal. No respiratory distress.  GI: Soft. Bowel sounds are normal. He exhibits no distension. There is no tenderness.  Musculoskeletal: He exhibits no edema.  Neurological: He is alert. Good phonation  restless.   Moves all extremities without  difficulty. equally moves all 4's. Senses pain in all 4. Oriented to name only. confabulates Skin: Skin is warm and dry.  Psychiatric:  Confused, alert,   Assessment/Plan: 1. 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 Physiatrist is providing close team supervision and 24 hour management of active medical problems listed below. Physiatrist and rehab team continue to assess barriers to discharge/monitor patient progress toward functional and medical goals. FIM: FIM - Bathing Bathing Steps Patient Completed: Chest, Abdomen, Front perineal area, Right Arm, Left Arm, Buttocks, Right upper leg, Left upper leg, Left lower leg (including foot), Right lower leg (including foot) Bathing: 5: Supervision: Safety issues/verbal cues  FIM - Upper Body Dressing/Undressing Upper body dressing/undressing steps patient completed: Thread/unthread right sleeve of pullover shirt/dresss, Thread/unthread left sleeve of pullover shirt/dress, Put head through opening of pull over shirt/dress, Pull shirt over trunk Upper body dressing/undressing: 5: Supervision: Safety issues/verbal cues FIM - Lower Body Dressing/Undressing Lower body dressing/undressing steps patient completed: Thread/unthread right underwear leg, Thread/unthread left underwear leg, Pull underwear up/down, Thread/unthread right pants leg, Thread/unthread left pants leg, Pull pants up/down, Don/Doff right sock, Don/Doff left sock, Don/Doff right shoe, Fasten/unfasten left shoe, Fasten/unfasten right shoe, Don/Doff left shoe Lower body dressing/undressing: 5: Set-up assist to: Obtain clothing  FIM - Toileting Toileting steps completed by patient: Adjust clothing prior to toileting, Adjust clothing after toileting, Performs perineal hygiene Toileting Assistive Devices: Grab bar or rail for  support Toileting: 5: Supervision: Safety issues/verbal  cues  FIM - Diplomatic Services operational officer Devices: Grab bars Toilet Transfers: 5-To toilet/BSC: Supervision (verbal cues/safety issues), 5-From toilet/BSC: Supervision (verbal cues/safety issues)  FIM - Banker Devices: Arm rests Bed/Chair Transfer: 5: Sit > Supine: Supervision (verbal cues/safety issues), 5: Supine > Sit: Supervision (verbal cues/safety issues), 5: Bed > Chair or W/C: Supervision (verbal cues/safety issues), 5: Chair or W/C > Bed: Supervision (verbal cues/safety issues)  FIM - Locomotion: Wheelchair Locomotion: Wheelchair: 0: Activity did not occur FIM - Locomotion: Ambulation Locomotion: Ambulation Assistive Devices: Other (comment) Ambulation/Gait Assistance: 5: Supervision Locomotion: Ambulation: 5: Travels 150 ft or more with supervision/safety issues  Comprehension Comprehension Mode: Auditory Comprehension: 2-Understands basic 25 - 49% of the time/requires cueing 51 - 75% of the time  Expression Expression Mode: Verbal Expression: 2-Expresses basic 25 - 49% of the time/requires cueing 50 - 75% of the time. Uses single words/gestures.  Social Interaction Social Interaction: 2-Interacts appropriately 25 - 49% of time - Needs frequent redirection.  Problem Solving Problem Solving: 2-Solves basic 25 - 49% of the time - needs direction more than half the time to initiate, plan or complete simple activities  Memory Memory: 1-Recognizes or recalls less than 25% of the time/requires cueing greater than 75% of the time  Medical Problem List and Plan: 1. Functional deficits secondary to TBI 2. DVT Prophylaxis/Anticoagulation: Pharmaceutical: Lovenox 3. Pain Management: Will continue oxycodone prn. Will likely need premedication as unable to express needs. Will monitor for now.  4. Mood: Unable to gauge at this time due to cognitive deficits. LCSW to follow along for evaluation and support once more  appropriate.  5. Neuropsych: This patient is not capable of making decisions on her own behalf. -continue in vail for safety 6. Skin/Wound Care: Pressure relief measures. Maintain adequate hydration and nutritional status as able.  7. Fluids/Electrolytes/Nutrition:megace stoppped 8. Recurrent  HCAP: Completed 10 day course of antibiotic regimen 01/17 for MSSA. now on empiric Levaquin 9. Reactive Leucocytosis: Resolving.  10. Hypokalemia: Likely due to poor nutritional status as well as dilutional .    11. Agitation: still confused,inappropriate but agitation controlled  -seroquel - continue Klonopin 0.0.5mg  tid - librium  tid -sleep cycle overall better -Continue vail bed for fall prevention and due to poor insight/awareness-vail soon 12 Activation:  Ritalin stopped due to agitation 13. ?GERD/odynophagia--pool tolerance of D1 with SLP---MBS today or tomorrow  -continue NPO for now   LOS (Days) 19 A FACE TO FACE EVALUATION WAS PERFORMED  Justin Pugh E 03/28/2014 7:35 AM

## 2014-03-28 NOTE — Treatment Plan (Signed)
After discussion with treatment team, patient decreased to 3.5 hours therapy/day in order to facilitate improved patient tolerance and decreased agitation.  Justin Pugh, PT, DPT 

## 2014-03-28 NOTE — Progress Notes (Signed)
Speech Language Pathology Note  Patient Details  Name: Justin Pugh MRN: [id number] Date of Birth: 1955-12-25 Today's Date: 03/28/2014  Pt was seen for an MBS to objectively determine safest diet consistency given increasing swallowing difficulty over the last 3 days.  Report can now be found under imaging.  Thank you.   Dazaria Macneill, Melanee SpryNicole L 03/28/2014, 3:46 PM

## 2014-03-29 ENCOUNTER — Inpatient Hospital Stay (HOSPITAL_COMMUNITY): Payer: Medicaid Other | Admitting: Physical Therapy

## 2014-03-29 NOTE — Progress Notes (Signed)
Physical Therapy Session Note  Patient Details  Name: [name] MRN: [id number] Date of Birth: 1955-07-09  Today's Date: 03/29/2014 PT Individual Time: 1300-1315 PT Individual Time Calculation (min): 15 min   Short Term Goals: Week 1:  PT Short Term Goal 1 (Week 1): Patient will perform bed mobility and functional transfers with supervision. PT Short Term Goal 1 - Progress (Week 1): Met PT Short Term Goal 2 (Week 1): Patient will perform functional ambulation >150' with consistent supervision. PT Short Term Goal 2 - Progress (Week 1): Met PT Short Term Goal 3 (Week 1): Patient will negotiate flight of stairs with one handrail and supervision. PT Short Term Goal 3 - Progress (Week 1): Met PT Short Term Goal 4 (Week 1): Patient will sustain attention to therapeutic activity x30" with mod cues. PT Short Term Goal 4 - Progress (Week 1): Met PT Short Term Goal 5 (Week 1): Patient will demonstrate intellectual awareness in controlled environment with min cues. PT Short Term Goal 5 - Progress (Week 1): Progressing toward goal  Skilled Therapeutic Interventions/Progress Updates:  As per nursing pt had attempted to elope 3 times this am, cleared to participate with therapy as tolerated. Upon entering sitter at bedside, pt in enclosure bed resting quietly. Introduced self to patient. Pt requested something to drink. Obtained nectar thick cranberry juice. Pt transferred supine to edge of bed with S. Pt sat on edge of bed with S while he drink is drink. Upon finishing drink pt got up and walker over to the sink with S. Pt attempted to get thin liquid from sink, pt educated on his current diet level. Pt returned to bed with S. Pt sat on edge. Pt became agitated ad threatening he would return when he gets better to harm this therapist. Pt redirect and educated on current diet and as soon as his diet can be upgraded it would. Pt returned to supine with S. Pt turned his back to therapist. Pt unwilling to  participate any further with therapy at this time. Pt back in enclosure bed with sitter at bedside. Treatment session ended and nursing notified.   Therapy Documentation Precautions:  Precautions Precautions: Fall Precaution Comments: Elopement risk, agitation at times Restrictions Weight Bearing Restrictions: No General: PT Amount of Missed Time (min): 45 Minutes PT Missed Treatment Reason: Increased agitation;Patient unwilling to participate (increased agitation) Vital Signs:   Pain: No c/o pain.    Locomotion : Ambulation Ambulation/Gait Assistance: 5: Supervision   See FIM for current functional status  Therapy/Group: Individual Therapy  Dub Amis 03/29/2014, 2:31 PM

## 2014-03-29 NOTE — Progress Notes (Signed)
Patient attempted to leave unit, able to redirect back to room and 2mg   haloperidol given for aggression
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
## 2014-03-30 ENCOUNTER — Inpatient Hospital Stay (HOSPITAL_COMMUNITY): Payer: Medicaid Other | Admitting: *Deleted

## 2014-03-30 ENCOUNTER — Inpatient Hospital Stay (HOSPITAL_COMMUNITY): Payer: Medicaid Other

## 2014-03-30 ENCOUNTER — Inpatient Hospital Stay (HOSPITAL_COMMUNITY): Payer: Medicaid Other | Admitting: Speech Pathology

## 2014-03-30 ENCOUNTER — Inpatient Hospital Stay (HOSPITAL_COMMUNITY): Payer: Medicaid Other | Admitting: Anesthesiology

## 2014-03-30 DIAGNOSIS — S065X4S Traumatic subdural hemorrhage with loss of consciousness of 6 hours to 24 hours, sequela: Secondary | ICD-10-CM

## 2014-03-30 MED ORDER — DIVALPROEX SODIUM 250 MG PO DR TAB
250.0000 mg | DELAYED_RELEASE_TABLET | Freq: Two times a day (BID) | ORAL | Status: DC
  Administered 2014-03-30 – 2014-04-02 (×5): 250 mg via ORAL
  Filled 2014-03-30 (×9): qty 1

## 2014-03-30 MED ORDER — CLONAZEPAM 0.5 MG PO TABS
0.2500 mg | ORAL_TABLET | Freq: Three times a day (TID) | ORAL | Status: DC
  Administered 2014-03-30 – 2014-04-03 (×7): 0.25 mg via ORAL
  Filled 2014-03-30 (×9): qty 1

## 2014-03-30 NOTE — Progress Notes (Signed)
Social Work Patient ID: Justin CohoJeffrey L Pugh, male   DOB: 1955/05/31, 59 y.o.   MRN: [id number] Asked by nursing to come to pt's room a gentleman-Justin Pugh stating he is pt's brother in-law is here to take pt home with him. He was told by MD yesterday that he needed to come and attend therapies with pt, he reported he is here for this. Informed him of pt's therapy schedule and he was welcome to stay  For them. Also informed him I was calling pt's mother to get clarification regarding this gentleman and if the discharge plan had changed. Awaiting Mom's return call-messages left on cell and home phones.

## 2014-03-30 NOTE — Progress Notes (Signed)
Speech Language Pathology Daily Session Note  Patient Details  Name: Justin Pugh MRN: [id number] Date of Birth: 10-Nov-1955  Today's Date: 03/30/2014 SLP Individual Time: 1130-1150 SLP Individual Time Calculation (min): 20 min and Today's Date: 03/30/2014 SLP Missed Time: 10 Minutes Missed Time Reason: Increased agitation;Patient unwilling to participate  Short Term Goals: Week 3: SLP Short Term Goal 1 (Week 3): Pt will consume current diet with minimal overt s/s of aspiration with  Mod A multimodal cues for use of swallow strategies  SLP Short Term Goal 2 (Week 3): Pt will sustain attention to basic, familiar task in structured environment for 10 minutes with Max cues SLP Short Term Goal 3 (Week 3): Pt will utilize external aids and environmental cues to demonstrate orientation to place, time and situation with Max cues SLP Short Term Goal 4 (Week 3): Patient will participate in a 30 minute session without verbal or physical agitiation with Max A multimodal cues.  SLP Short Term Goal 5 (Week 3): Pt will follow one-step commands with 80% accuracy throughout basic, functional tasks in structured environment with Max cues  Skilled Therapeutic Interventions: Skilled treatment session focused on dysphagia and cognitive goals. Upon arrival, patient was sitting EOB receiving medication from RN and patient had a wet vocal quality. Patient unable to follow commands and would not clear his throat despite Max encouragement and multimodal cues. Patient became verbally agitated when told he could not have water at this time and requested a "magic cup." SLP facilitated session by providing skilled observation with Dys. 1 textures and honey-thick liquids. SLP attempted to feed the patient in order to control bite size and rate, however, patient became verbally agitated.  Patient consumed large bites and demonstrated oral holding with a delayed swallow initiation which led to a wet vocal quality with eventual  coughing episodes, suspect due to pharyngeal residue.  Patient continued to require total A to utilize swallowing compensatory strategies and to self-monitor and correct his wet vocal quality. Patient became frustrated with this clinician due to amount of cuing needed to consume snack safely and declined further engagement in session and returned to supine position in enclosure bed, therefore, patient missed remaining 10 minutes of session.  Patient then demonstrated overt and prolonged coughing episode while in supine, again, suspect due to pharyngeal residuals.  Patient demonstrated little to no verbal expression throughout the session and was easily verbally agitated.  Due to patient's overt s/s of aspiration with Dys. 1 textures and decreased ability to follow compensatory strategies, recommend patient should be NPO at this time. Physician, PA and RN made aware of recommendations and current swallowing function.  Continue with current plan of care.    FIM:  Comprehension Comprehension Mode: Auditory Comprehension: 2-Understands basic 25 - 49% of the time/requires cueing 51 - 75% of the time Expression Expression Mode: Verbal Expression: 2-Expresses basic 25 - 49% of the time/requires cueing 50 - 75% of the time. Uses single words/gestures. Social Interaction Social Interaction: 2-Interacts appropriately 25 - 49% of time - Needs frequent redirection. Problem Solving Problem Solving: 2-Solves basic 25 - 49% of the time - needs direction more than half the time to initiate, plan or complete simple activities Memory Memory: 2-Recognizes or recalls 25 - 49% of the time/requires cueing 51 - 75% of the time FIM - Eating Eating Activity: 5: Supervision/cues  Pain Pain Assessment Pain Assessment: No/denies pain  Therapy/Group: Individual Therapy  Lawanna Cecere 03/30/2014, 1:57 PM
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
## 2014-03-30 NOTE — Progress Notes (Signed)
Social Work Patient ID: Justin Pugh, male   DOB: Dec 01, 1955, 59 y.o.   MRN: [id number] Phone call from Julie-pt's girlfriend who wants to know the reason she can not come and see him or talk with him.  This worker is not aware of this and will  Need to have pt's mom input. Still awaiting mom's return phone call.  Raynelle FanningJulie reports Lillia AbedLindsay is actually a friend of theirs and wants to know the reason he can Visit and not her.  Discussed trying to keep pt calm and less agitated.  She is aware of this but wants to atleast talk to him on the phone.  Will try to call Mom again.

## 2014-03-30 NOTE — Progress Notes (Addendum)
Occupational Therapy Session Note  Patient Details  Name: Justin Pugh MRN: [id number] Date of Birth: [birth year]  Today's Date: 03/30/2014 OT Individual Time: 1020-1105 45 Minutes  Short Term Goals: Week 3:  OT Short Term Goal 1 (Week 3): Pt will identify 1 physical or cognitive deficit with max cues OT Short Term Goal 2 (Week 3): Pt will follow behavioral management plan for out of room schedule with therapy 100% of time with max cues OT Short Term Goal 3 (Week 3): Pt will be oriented x2 with max cues  OT Short Term Goal 4 (Week 3): Pt will initiate 1 self-care task with min cues  Skilled Therapeutic Interventions/Progress Updates:  ADL-retraining with focus on improved initiation of self-care and orientation.   Pt received in enclosure bed with sitter present.   With min vc to orient to day/time and situation, pt proceeded with bathing and dressing at shower level.   Pt required mod vc and consistent intermittent redirection to sustain attention to task to gather supplies and proceed to shower to bathe and toilet without assistance.   Pt required vc to sequence from shower to dressing at edge of bed with intermittent assist to problem-solve while donning shirt and shoes.   With min vc, pt elected to groom (shave seated at sink with supervision) and he sustained attention to task for 10 min with min assist from therapist to attend to remaining stubble/beard under his chin and neck.   Pt demo'd no agitation during this session and remained cordial to therapist and sitter, following direction and cues without protest.     Therapy Documentation Precautions:  Precautions Precautions: Fall Precaution Comments: Elopement risk, agitation at times Restrictions Weight Bearing Restrictions: No   Pain: No/denies pain     See FIM for current functional status  Therapy/Group: Individual Therapy  Oleva Koo 03/31/2014, 4:27 AM

## 2014-03-30 NOTE — Progress Notes (Signed)
Physical Therapy Session Note  Patient Details  Name: Justin Pugh MRN: [id number] Date of Birth: 1955/10/23  Today's Date: 03/30/2014 PT Individual Time: [phone] and 1530-1605 PT Individual Time Calculation (min): 53 min and 25 min  Short Term Goals: Week 3:  PT Short Term Goal 1 (Week 3): STGs=LTGs  Skilled Therapeutic Interventions/Progress Updates:    AM Session: Patient received asleep in enclosure bed. Session focused on sustained attention and cognitive remediation. RN present to provide medications. Additionally, patient consumed majority of magic cup with mod cues for appropriate bite size, although patient continues with big bites. Patient coughing several times after consumption. Engaged patient in Connect[employer], patient able to state main rules of gameplay and able to sustain attention to engage in 2 games, however, appears with no strategy and very impulsive/quick pacing with play noted. Remainder of session, patient restless in room, packing various items in preparation for "leaving", unable to be redirected despite max multimodal cues. Patient able to be re-directed to enclosure bed with negotiation. Patient left supine in enclosure bed. Detailed recommendations/education provided to current sitter in regards to patient's behavioral management and overall safety.  PM Session: Patient received supine in enclosure bed. Session focused on sustained attention and application of rules/regulations during game of Uno. Patient ambulated out of room with distant supervision, approx 75' to/from room. Patient engaged in game of Juanna CaoUno x6-7' before becoming agitated (slamming cards down on table, throwing cards off table, cursing) secondary to therapist holding patient accountable to rules/regulations and turn taking during game. Patient immediately redirected back to room and back to enclosure bed as "repercussion" of behavior. Patient left supine in enclosure bed with sitter present.  Therapy  Documentation Precautions:  Precautions Precautions: Fall Precaution Comments: Elopement risk, agitation at times Restrictions Weight Bearing Restrictions: No General: PT Amount of Missed Time (min): 7 Minutes; 35 minutes PT Missed Treatment Reason: Increased agitation Pain: Pain Assessment Pain Assessment: No/denies pain Pain Score: 0-No pain Locomotion : Ambulation Ambulation/Gait Assistance: 5: Supervision   See FIM for current functional status  Therapy/Group: Individual Therapy  Chipper HerbBridget S Amit Leece S. Eli Pattillo, PT, DPT 03/30/2014, 9:01 AM
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
## 2014-03-30 NOTE — Progress Notes (Addendum)
Patient has had decline in swallow.  Discussed patients symptoms of decline in swallow function with wet voice as well as oropharyngeal dysphagia. Results of MBS reviewed with Dr. Evette CristalGanem who felt that patient's symptoms likely neurological and not GI related. Will order MRI brain for work up and consult Neurology based on results.  Dr. Riley KillSwartz to decrease sedating medications as this could be contributing to wet voice sedative effect causing decline in swallow.   Spoke with patient and his friend "Moose" early am as friend reported that he was here to take patient home. Patient perseverated on going home with his friend.  Advised "Moose"  That the patient was not competent to make decisions at this time and that this would have to be cleared with the next of kin. Advised him to get together with patient's mother and to come up with disposition that would be mutually agreed upon. Patient and mother can contact LCSW once safe discharge plan was set up.

## 2014-03-31 ENCOUNTER — Inpatient Hospital Stay (HOSPITAL_COMMUNITY): Payer: Self-pay | Admitting: Physical Therapy

## 2014-03-31 ENCOUNTER — Inpatient Hospital Stay (HOSPITAL_COMMUNITY): Payer: Medicaid Other | Admitting: *Deleted

## 2014-03-31 ENCOUNTER — Encounter (HOSPITAL_COMMUNITY)
Admission: AD | Disposition: A | Discharge status: Skilled Nursing Facility | Payer: Self-pay | Source: Intra-hospital | Attending: Physical Medicine & Rehabilitation

## 2014-03-31 ENCOUNTER — Inpatient Hospital Stay (HOSPITAL_COMMUNITY): Payer: Medicaid Other

## 2014-03-31 ENCOUNTER — Inpatient Hospital Stay (HOSPITAL_COMMUNITY): Payer: Self-pay | Admitting: *Deleted

## 2014-03-31 DIAGNOSIS — R1314 Dysphagia, pharyngoesophageal phase: Secondary | ICD-10-CM

## 2014-03-31 SURGERY — RADIOLOGY WITH ANESTHESIA
Anesthesia: General

## 2014-03-31 MED ORDER — HALOPERIDOL LACTATE 5 MG/ML IJ SOLN
3.0000 mg | Freq: Once | INTRAMUSCULAR | Status: AC
  Administered 2014-03-31: 3 mg via INTRAMUSCULAR
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
## 2014-03-31 NOTE — Progress Notes (Signed)
Recreational Therapy Session Note  Patient Details  Name: Justin Pugh MRN: [id number] Date of Birth: May 16, 1955 Today's Date: 03/31/2014  Session cancelled, pt agitated during scheduled treatment time. Celedonio Sortino 03/31/2014, 4:15 PM

## 2014-03-31 NOTE — Progress Notes (Signed)
Physical Therapy Note  Patient Details  Name: Katrine CohoJeffrey L Timothy MRN: [id number] Date of Birth: 1955/04/21 Today's Date: 03/31/2014  Patient missed 60 minutes of skilled physical therapy this PM secondary to agitation. Additionally, patient now QD therapies and has already received one PT visit today. Will follow up as able.   Zella RicherBridget S Kaseem Vastine S. Keyshon Stein, PT, DPT 03/31/2014, 3:45 PM

## 2014-03-31 NOTE — Anesthesia Preprocedure Evaluation (Deleted)
Anesthesia Evaluation  Patient identified by MRN, date of birth, ID band Patient awake    Reviewed: Allergy & Precautions, NPO status , Patient's Chart, lab work & pertinent test results  Airway Mallampati: II  TM Distance: >3 FB Neck ROM: Full    Dental no notable dental hx.    Pulmonary neg pulmonary ROS, Current Smoker,  breath sounds clear to auscultation  Pulmonary exam normal       Cardiovascular negative cardio ROS  Rhythm:Regular Rate:Normal     Neuro/Psych PSYCHIATRIC DISORDERS negative neurological ROS     GI/Hepatic Neg liver ROS, GERD-  ,  Endo/Other  negative endocrine ROS  Renal/GU negative Renal ROS     Musculoskeletal negative musculoskeletal ROS (+)   Abdominal   Peds  Hematology negative hematology ROS (+)   Anesthesia Other Findings   Reproductive/Obstetrics                             Anesthesia Physical Anesthesia Plan  ASA: II  Anesthesia Plan: General   Post-op Pain Management:    Induction: Intravenous  Airway Management Planned: Oral ETT  Additional Equipment: None  Intra-op Plan:   Post-operative Plan: Extubation in OR  Informed Consent: I have reviewed the patients History and Physical, chart, labs and discussed the procedure including the risks, benefits and alternatives for the proposed anesthesia with the patient or authorized representative who has indicated his/her understanding and acceptance.   Dental advisory given  Plan Discussed with: CRNA  Anesthesia Plan Comments:         Anesthesia Quick Evaluation

## 2014-03-31 NOTE — Progress Notes (Signed)
[place] PHYSICAL MEDICINE & REHABILITATION     PROGRESS NOTE    Subjective/Complaints: Up and agitated at times last night. ROS limited due to cognition  Objective: Vital Signs: Blood pressure 117/84, pulse 80, temperature 98.7 F (37.1 C), temperature source Oral, resp. rate 18, weight 82.9 kg (182 lb 12.2 oz), SpO2 99 %. No results found. No results for input(s): WBC, HGB, HCT, PLT in the last 72 hours. No results for input(s): NA, K, CL, GLUCOSE, BUN, CREATININE, CALCIUM in the last 72 hours.  Invalid input(s): CO CBG (last 3)  No results for input(s): GLUCAP in the last 72 hours.  Wt Readings from Last 3 Encounters:  03/30/14 82.9 kg (182 lb 12.2 oz)  03/09/14 84.188 kg (185 lb 9.6 oz)  01/23/14 95.255 kg (210 lb)    Physical Exam:  Constitutional: He appears well-developed and well-nourished. He is sleeping. He is easily aroused.  HENT:  Head: Normocephalic.  Neck: supple Cardiovascular: Normal rate and regular rhythm.  Respiratory: Effort normal. No respiratory distress.  GI: Soft. Bowel sounds are normal. He exhibits no distension. There is no tenderness.  Musculoskeletal: He exhibits no edema.  Neurological: He is alert. Good phonation  restless.   Moves all extremities without difficulty. equally moves all 4's. Senses pain in all 4. Oriented to name only. confabulates Skin: Skin is warm and dry.  Psychiatric:  Confused   Assessment/Plan: 1. 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 Physiatrist is providing close team supervision and 24 hour management of active medical problems listed below. Physiatrist and rehab team continue to assess barriers to discharge/monitor patient progress toward functional and medical goals. FIM: FIM - Bathing Bathing Steps Patient Completed: Chest, Abdomen, Front perineal area, Right Arm, Left Arm, Buttocks, Right upper  leg, Left upper leg, Left lower leg (including foot), Right lower leg (including foot) Bathing: 5: Supervision: Safety issues/verbal cues  FIM - Upper Body Dressing/Undressing Upper body dressing/undressing steps patient completed: Thread/unthread right sleeve of pullover shirt/dresss, Thread/unthread left sleeve of pullover shirt/dress, Put head through opening of pull over shirt/dress, Pull shirt over trunk Upper body dressing/undressing: 5: Supervision: Safety issues/verbal cues FIM - Lower Body Dressing/Undressing Lower body dressing/undressing steps patient completed: Thread/unthread right underwear leg, Thread/unthread left underwear leg, Pull underwear up/down, Thread/unthread right pants leg, Thread/unthread left pants leg, Pull pants up/down, Don/Doff right sock, Don/Doff left sock, Don/Doff right shoe, Fasten/unfasten left shoe, Fasten/unfasten right shoe, Don/Doff left shoe Lower body dressing/undressing: 5: Set-up assist to: Obtain clothing  FIM - Toileting Toileting steps completed by patient: Adjust clothing prior to toileting, Adjust clothing after toileting, Performs perineal hygiene Toileting Assistive Devices: Grab bar or rail for support Toileting: 5: Supervision: Safety issues/verbal cues  FIM - Diplomatic Services operational officer Devices: Therapist, music Transfers: 5-To toilet/BSC: Supervision (verbal cues/safety issues), 5-From toilet/BSC: Supervision (verbal cues/safety issues)  FIM - Banker Devices: Arm rests Bed/Chair Transfer: 5: Supine > Sit: Supervision (verbal cues/safety issues), 5: Sit > Supine: Supervision (verbal cues/safety issues), 5: Bed > Chair or W/C: Supervision (verbal cues/safety issues), 5: Chair or W/C > Bed: Supervision (verbal cues/safety issues)  FIM - Locomotion: Wheelchair Locomotion: Wheelchair: 0: Activity did not occur FIM - Locomotion: Ambulation Locomotion: Ambulation Assistive Devices:  Other (comment) (no AD) Ambulation/Gait Assistance: 5: Supervision Locomotion: Ambulation: 2: Travels 50 - 149 ft with supervision/safety issues  Comprehension Comprehension Mode: Auditory Comprehension: 2-Understands basic 25 -  49% of the time/requires cueing 51 - 75% of the time  Expression Expression Mode: Verbal Expression: 2-Expresses basic 25 - 49% of the time/requires cueing 50 - 75% of the time. Uses single words/gestures.  Social Interaction Social Interaction: 2-Interacts appropriately 25 - 49% of time - Needs frequent redirection.  Problem Solving Problem Solving: 2-Solves basic 25 - 49% of the time - needs direction more than half the time to initiate, plan or complete simple activities  Memory Memory: 2-Recognizes or recalls 25 - 49% of the time/requires cueing 51 - 75% of the time  Medical Problem List and Plan: 1. Functional deficits secondary to TBI 2. DVT Prophylaxis/Anticoagulation: Pharmaceutical: Lovenox 3. Pain Management: Will continue oxycodone prn. Will likely need premedication as unable to express needs. Will monitor for now.  4. Mood: Unable to gauge at this time due to cognitive deficits. LCSW to follow along for evaluation and support once more appropriate.  5. Neuropsych: This patient is not capable of making decisions on her own behalf. -continue in vail for safety 6. Skin/Wound Care: Pressure relief measures. Maintain adequate hydration and nutritional status as able.  7. Fluids/Electrolytes/Nutrition:megace stoppped 8. Recurrent  HCAP: Completed 10 day course of antibiotic regimen 01/17 for MSSA. now on empiric Levaquin 9. Reactive Leucocytosis: Resolving.  10. Hypokalemia: Likely due to poor nutritional status as well as dilutional .    11. Agitation: still confused,inappropriate,easily agitated  -seroquel -continue Klonopin reduced to 0.25mg  tid - librium 25mg  tid  -added depakote for  further behavior control -sleep cycle overall better but still not consistent -Continue vail bed for fall prevention and due to poor insight/awareness  -appreciate psych assessment and input---[facility] ----transfer potentially when medically stable  13. ?GERD/odynophagia/dysphagia----murky situation   -continue NPO for now  -discussed briefly with GI yesterday  -have ordered MRI of brain/neck (under sedation) to gain some clarity to this situation  -reducing neurosedating meds as possible 14. RML pneumonia---levaquin   LOS (Days) 22 A FACE TO FACE EVALUATION WAS PERFORMED  SWARTZ,ZACHARY T 03/31/2014 8:16 AM
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
## 2014-03-31 NOTE — Progress Notes (Signed)
Physical Therapy Session Note  Patient Details  Name: Justin Pugh MRN: [id number] Date of Birth: 22-Dec-1955  Today's Date: 03/31/2014 PT Individual Time: 1300-1324 PT Individual Time Calculation (min): 24 min   Short Term Goals: Week 3:  PT Short Term Goal 1 (Week 3): STGs=LTGs  Skilled Therapeutic Interventions/Progress Updates:   Patient received asleep in enclosure bed. Session focused on sustained attention and cognitive remediation. Patient easily aroused and donned underwear, pants, and shoes with min cues to initiate dressing before leaving room. Utilized negotiation to reward positive behavior in controlled environment. Patient agreeable and ambulated out of room to therapy gym with distant supervision x 150 ft. Engaged patient in card game. Although patient refused to explain rules of game to therapist, patient was able to complete game with min cues for playing by rules. After losing card game, patient got up and began to perseverate on getting a drink of coffee. Patient with increased agitation due to therapist questioning patient throughout session and when patient told he was unable to have coffee (patient NPO). Patient left therapy gym and attempted to go in family room to get coffee. Therapist blocked patient's path and patient punched large metal meal cart in hallway. Patient physically redirected back to room by handhold at elbow and back to enclosure bed due to increased agitation and behavior. Patient left supine in enclosure bed with sitter notified of patient position.   Therapy Documentation Precautions:  Precautions Precautions: Fall Precaution Comments: Elopement risk, agitation at times Restrictions Weight Bearing Restrictions: No General: PT Amount of Missed Time (min): 36 Minutes PT Missed Treatment Reason: Increased agitation Pain: Pain Assessment Pain Assessment: Faces Faces Pain Scale: No hurt Locomotion : Ambulation Ambulation/Gait Assistance: 5:  Supervision   See FIM for current functional status  Therapy/Group: Individual Therapy  [name], Froylan Hobby A 03/31/2014, 1:49 PM

## 2014-03-31 NOTE — Progress Notes (Signed)
Occupational Therapy Session Note  Patient Details  Name: Justin Pugh MRN: [id number] Date of Birth: 25-Jun-1955  Today's Date: 03/31/2014 OT Individual Time: [id number] OT Individual Time Calculation (min): 35 min  and Today's Date: 03/31/2014 OT Missed Time: 25 Minutes Missed Time Reason: Other (comment) (agitation)   Short Term Goals: Week 3:  OT Short Term Goal 1 (Week 3): Pt will identify 1 physical or cognitive deficit with max cues OT Short Term Goal 2 (Week 3): Pt will follow behavioral management plan for out of room schedule with therapy 100% of time with max cues OT Short Term Goal 3 (Week 3): Pt will be oriented x2 with max cues  OT Short Term Goal 4 (Week 3): Pt will initiate 1 self-care task with min cues  Skilled Therapeutic Interventions/Progress Updates:    Pt seen for ADL retraining with focus on initiation, sustained attention, functional mobility, and de-escalation. Pt received supine in bed with sitter present. Pt easily aroused and required total A for orientation. Pt agreeable to bathing and dressing with no resistance. Pt ambulated to bathroom at supervision level then completed bathing in standing with mod cues for thoroughness. Pt returned to sitting EOB and initiated grooming task of brushing hair. Pt perseverating on wanting a "cigarette" and "food," and beginning to escalate as he was cursing and raising voice. With max cues for re-direction, pt returned to enclosure and returned to supine with increased time. Pt left in enclosure bed for de-escalation.  Therapy Documentation Precautions:  Precautions Precautions: Fall Precaution Comments: Elopement risk, agitation at times Restrictions Weight Bearing Restrictions: No General: General OT Amount of Missed Time: 25 Minutes Vital Signs:   Pain: No report of pain  See FIM for current functional status  Therapy/Group: Individual Therapy  Daneil Danerkinson, Kecia Swoboda N 03/31/2014, 9:44 AM

## 2014-03-31 NOTE — Clinical Social Work Psych Note (Signed)
[facility] CS[facility].  Authorization from [place] was received [id number].  Patient is currently being reviewed for possible waitlist admission.  Vickii PennaGina Tawni Melkonian, LCSWA [phone]  Psychiatric & Orthopedics (5N 1-16) Clinical Social Worker

## 2014-03-31 NOTE — Progress Notes (Signed)
Nursing Note: No void this shift. Pt NPO,no liquids.Pt up to bathroom but did not void. Wanted to brush his teeth and became agitated and throwing washcloth when he washed his hands when informed ,he could not have liquids.Pt cursed,fussed and then assisted back to bed.wbb

## 2014-03-31 NOTE — Plan of Care (Signed)
After discussion with treatment team, patient decreased to QD therapy in order to facilitate improved patient tolerance and decreased agitation.  Kosta Schnitzler,, OTR/L

## 2014-03-31 NOTE — Progress Notes (Signed)
Speech Language Pathology Daily Session Note  Patient Details  Name: Justin CohoJeffrey L Rupard MRN: [id number] Date of Birth: 12-08-1955  Today's Date: 03/31/2014 SLP Individual Time: 1100-1130 SLP Individual Time Calculation (min): 30 min  Short Term Goals: Week 3: SLP Short Term Goal 1 (Week 3): Pt will consume current diet with minimal overt s/s of aspiration with  Mod A multimodal cues for use of swallow strategies  SLP Short Term Goal 2 (Week 3): Pt will sustain attention to basic, familiar task in structured environment for 10 minutes with Max cues SLP Short Term Goal 3 (Week 3): Pt will utilize external aids and environmental cues to demonstrate orientation to place, time and situation with Max cues SLP Short Term Goal 4 (Week 3): Patient will participate in a 30 minute session without verbal or physical agitiation with Max A multimodal cues.  SLP Short Term Goal 5 (Week 3): Pt will follow one-step commands with 80% accuracy throughout basic, functional tasks in structured environment with Max cues  Skilled Therapeutic Interventions: Skilled treatment focused on cognitive goals. Pt sedated prior to therapy session in anticipation of MRI to be performed today. Pt was agreeable to participate in therapy. He was oriented to the "hospital" but required Max cues for increased orientation to which hospital as well as to his current situation. Pt performed oral care with Mod cues from therapist to ensure that he did not swallow any liquid. Wet vocal quality was noted throughout session, requiring Max cues from therapist for awareness to cough and clear his throat. Pt was returned to his bed when his behavior began to escalate in order to try to calm him before MRI. Anesthesiologist arrived upon SLP completion of session.   FIM:  Comprehension Comprehension Mode: Auditory Comprehension: 2-Understands basic 25 - 49% of the time/requires cueing 51 - 75% of the time Expression Expression Mode:  Verbal Expression: 2-Expresses basic 25 - 49% of the time/requires cueing 50 - 75% of the time. Uses single words/gestures. Social Interaction Social Interaction: 2-Interacts appropriately 25 - 49% of time - Needs frequent redirection. Problem Solving Problem Solving: 2-Solves basic 25 - 49% of the time - needs direction more than half the time to initiate, plan or complete simple activities Memory Memory: 2-Recognizes or recalls 25 - 49% of the time/requires cueing 51 - 75% of the time  Pain Pain Assessment Pain Assessment: No/denies pain  Therapy/Group: Individual Therapy   Maxcine HamLaura Paiewonsky, M.A. CCC-SLP [phone]  Maxcine Hamaiewonsky, Nataliah Hatlestad 03/31/2014, 12:29 PM

## 2014-04-01 ENCOUNTER — Inpatient Hospital Stay (HOSPITAL_COMMUNITY): Payer: Medicaid Other

## 2014-04-01 ENCOUNTER — Encounter (HOSPITAL_COMMUNITY)
Admission: AD | Disposition: A | Discharge status: Skilled Nursing Facility | Payer: Self-pay | Source: Intra-hospital | Attending: Physical Medicine & Rehabilitation

## 2014-04-01 ENCOUNTER — Inpatient Hospital Stay (HOSPITAL_COMMUNITY): Payer: Medicaid Other | Admitting: Anesthesiology

## 2014-04-01 ENCOUNTER — Inpatient Hospital Stay (HOSPITAL_COMMUNITY): Payer: Self-pay | Admitting: Speech Pathology

## 2014-04-01 ENCOUNTER — Inpatient Hospital Stay (HOSPITAL_COMMUNITY): Payer: Medicaid Other | Admitting: *Deleted

## 2014-04-01 HISTORY — PX: RADIOLOGY WITH ANESTHESIA: SHX6223

## 2014-04-01 SURGERY — RADIOLOGY WITH ANESTHESIA
Anesthesia: General

## 2014-04-01 MED ORDER — GLYCOPYRROLATE 0.2 MG/ML IJ SOLN
INTRAMUSCULAR | Status: AC
  Filled 2014-04-01: qty 3

## 2014-04-01 MED ORDER — HALOPERIDOL LACTATE 5 MG/ML IJ SOLN: 3.0000 mg | Freq: Once | INTRAMUSCULAR | Status: DC

## 2014-04-01 MED ORDER — HALOPERIDOL LACTATE 5 MG/ML IJ SOLN
10.0000 mg | Freq: Once | INTRAMUSCULAR | Status: DC | PRN
  Filled 2014-04-01: qty 2

## 2014-04-01 MED ORDER — MEPERIDINE HCL 25 MG/ML IJ SOLN: 6.2500 mg | INTRAMUSCULAR | Status: DC | PRN

## 2014-04-01 MED ORDER — PROMETHAZINE HCL 25 MG/ML IJ SOLN: 6.2500 mg | INTRAMUSCULAR | Status: DC | PRN

## 2014-04-01 MED ORDER — NEOSTIGMINE METHYLSULFATE 10 MG/10ML IV SOLN
INTRAVENOUS | Status: AC
  Filled 2014-04-01: qty 1

## 2014-04-01 MED ORDER — HALOPERIDOL LACTATE 5 MG/ML IJ SOLN
3.0000 mg | Freq: Once | INTRAMUSCULAR | Status: AC
  Administered 2014-04-01: 3 mg via INTRAMUSCULAR

## 2014-04-01 MED ORDER — FENTANYL CITRATE 0.05 MG/ML IJ SOLN: 25.0000 ug | INTRAMUSCULAR | Status: DC | PRN

## 2014-04-01 MED ORDER — MIDAZOLAM HCL 2 MG/2ML IJ SOLN
INTRAMUSCULAR | Status: AC
  Filled 2014-04-01: qty 2

## 2014-04-01 MED ORDER — HALOPERIDOL LACTATE 5 MG/ML IJ SOLN
INTRAMUSCULAR | Status: AC
  Administered 2014-04-01: 2 mg via INTRAMUSCULAR
  Filled 2014-04-01: qty 1

## 2014-04-01 MED ORDER — KETAMINE HCL 100 MG/ML IJ SOLN
INTRAMUSCULAR | Status: AC
  Filled 2014-04-01: qty 1

## 2014-04-01 MED ORDER — HALOPERIDOL LACTATE 5 MG/ML IJ SOLN
2.0000 mg | Freq: Four times a day (QID) | INTRAMUSCULAR | Status: DC | PRN
  Administered 2014-04-01: 2 mg via INTRAMUSCULAR
  Filled 2014-04-01: qty 1

## 2014-04-01 MED ORDER — HALOPERIDOL LACTATE 5 MG/ML IJ SOLN
2.5000 mg | INTRAMUSCULAR | Status: DC | PRN
  Administered 2014-04-01 (×2): 2.5 mg via INTRAVENOUS

## 2014-04-01 MED ORDER — ONDANSETRON HCL 4 MG/2ML IJ SOLN
INTRAMUSCULAR | Status: AC
  Administered 2014-04-01: 4 mg
  Filled 2014-04-01: qty 2

## 2014-04-01 NOTE — Progress Notes (Deleted)
Speech Language Pathology Weekly Progress Note  Patient Details  Name: Justin Pugh MRN: [id number] Date of Birth: June 04, 1955  Beginning of progress report period: March 24, 2014 End of progress report period: April 01, 2014  Weekly Progress Updates: Patient has made minimal progress and has not met any STG's this reporting period. Patient's progress continues to be limited by patient's verbal and physical agitation throughout the day and cooperation level. Currently, patient is demonstrating behaviors consistent with a Rancho Level IV and requires Max-Total A multimodal cues for orientation to time, place and situation, sustained attention, intellectual awareness of deficits, working memory, Surveyor, mining and functional problem solving.  Patient has also demonstrated a functional change in his swallowing function and started demonstrating overt s/s of aspiration with all consistencies.  Patient had MBS on 03/28/13 and was made NPO.  This clinician is unsure of etiology of increased swallowing difficulty due to no other obvious functional change. Physician is aware and medically managing the situation. Patient would benefit from continued skilled SLP intervention in order to maximize his cognitive and swallowing function in order to maximize his overall functional independence and reduce caregiver burden prior to discharge.   Intensity: Minumum of 1-2 x/day, 30 to 90 minutes Frequency: 3 to 5 out of 7 days Duration/Length of Stay: TBD due to SNF placement  Treatment/Interventions: Cognitive remediation/compensation;Cueing hierarchy;Dysphagia/aspiration precaution training;Environmental controls;Functional tasks;Internal/external aids;Patient/family education;Speech/Language facilitation;Therapeutic Activities   [place], [place] 04/01/2014, 4:04 PM

## 2014-04-01 NOTE — Progress Notes (Signed)
On time dose Haldol 3mg  not given due to delay in MRI. During therapy pt ate a spoon full of peanut butter. Notified Marissa NestlePam Love, PA and anesthesiologist team with finding. MRI rescheduled for 3pm. Will continue to monitor pt and POC. Pt NPO.

## 2014-04-01 NOTE — Progress Notes (Addendum)
Nursing Note: Pt still sleeping hard.T-98.9 P-83 R-16 BP- 114/71 PO2 95% on r/a.Pt aroused for snack and some fluid intake and went right back to sleep.Night meds held as pt still sleepy from sedation earlier for procedure.Sitter at the bedside.wbb

## 2014-04-01 NOTE — Anesthesia Preprocedure Evaluation (Signed)
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

## 2014-04-01 NOTE — Clinical Social Work Psych Note (Signed)
Psych CSW was contacted by Junious Dresseronnie at [facility] and requested labs, progress notes re: fractures and xray results.  Clinicals were faxed.  Patient continues to be under review [facility].  Vickii PennaGina Romy Ipock, LCSWA [phone]  Psychiatric & Orthopedics (5N 1-16) Clinical Social Worker

## 2014-04-01 NOTE — Transfer of Care (Signed)
Immediate Anesthesia Transfer of Care Note  Patient: Justin CohoJeffrey L Pugh  Procedure(s) Performed: Procedure(s): MRI BRAIN WITHOUT CONTRAST /RADIOLOGY WITH ANESTHESIA (N/A)  Patient Location: PACU  Anesthesia Type:General  Level of Consciousness: awake and confused  Airway & Oxygen Therapy: Patient Spontanous Breathing and Patient connected to nasal cannula oxygen  Post-op Assessment: Report given to RN and Post -op Vital signs reviewed and stable  Post vital signs: Reviewed and stable  Last Vitals:  Filed Vitals:   04/01/14 1700  BP:   Pulse: 69  Temp:   Resp: 18    Complications: No apparent anesthesia complications

## 2014-04-01 NOTE — Progress Notes (Signed)
[place] PHYSICAL MEDICINE & REHABILITATION     PROGRESS NOTE    Subjective/Complaints: Slept well last night. ROS limited due to cognition  Objective: Vital Signs: Blood pressure 112/76, pulse 82, temperature 98 F (36.7 C), temperature source Oral, resp. rate 18, weight 81.92 kg (180 lb 9.6 oz), SpO2 99 %. No results found. No results for input(s): WBC, HGB, HCT, PLT in the last 72 hours. No results for input(s): NA, K, CL, GLUCOSE, BUN, CREATININE, CALCIUM in the last 72 hours.  Invalid input(s): CO CBG (last 3)  No results for input(s): GLUCAP in the last 72 hours.  Wt Readings from Last 3 Encounters:  04/01/14 81.92 kg (180 lb 9.6 oz)  03/09/14 84.188 kg (185 lb 9.6 oz)  01/23/14 95.255 kg (210 lb)    Physical Exam:  Constitutional: He appears well-developed and well-nourished. He is sleeping. He is easily aroused.  HENT:  Head: Normocephalic.  Neck: supple Cardiovascular: Normal rate and regular rhythm.  Respiratory: Effort normal. No respiratory distress.  GI: Soft. Bowel sounds are normal. He exhibits no distension. There is no tenderness.  Musculoskeletal: He exhibits no edema.  Neurological: He is alert. Good phonation  restless.   Moves all extremities without difficulty. equally moves all 4's. Senses pain in all 4. Oriented to name only. confabulates Skin: Skin is warm and dry.  Psychiatric:  Confused   Assessment/Plan: 1. 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 Physiatrist is providing close team supervision and 24 hour management of active medical problems listed below. Physiatrist and rehab team continue to assess barriers to discharge/monitor patient progress toward functional and medical goals.  Have reduced therapies to QD given plateau, patient cooperation.   FIM: FIM - Bathing Bathing Steps Patient Completed: Chest, Abdomen, Front  perineal area, Right Arm, Left Arm, Buttocks, Right upper leg, Left upper leg, Left lower leg (including foot), Right lower leg (including foot) Bathing: 5: Supervision: Safety issues/verbal cues  FIM - Upper Body Dressing/Undressing Upper body dressing/undressing steps patient completed: Thread/unthread right sleeve of pullover shirt/dresss, Thread/unthread left sleeve of pullover shirt/dress, Put head through opening of pull over shirt/dress, Pull shirt over trunk Upper body dressing/undressing: 5: Supervision: Safety issues/verbal cues FIM - Lower Body Dressing/Undressing Lower body dressing/undressing steps patient completed: Thread/unthread right underwear leg, Thread/unthread left underwear leg, Pull underwear up/down, Thread/unthread right pants leg, Thread/unthread left pants leg, Pull pants up/down, Don/Doff right sock, Don/Doff left sock, Don/Doff right shoe, Fasten/unfasten left shoe, Fasten/unfasten right shoe, Don/Doff left shoe Lower body dressing/undressing: 5: Set-up assist to: Obtain clothing  FIM - Toileting Toileting steps completed by patient: Adjust clothing prior to toileting, Performs perineal hygiene, Adjust clothing after toileting Toileting Assistive Devices: Grab bar or rail for support Toileting: 4: Steadying assist  FIM - Diplomatic Services operational officer Devices: Therapist, music Transfers: 5-To toilet/BSC: Supervision (verbal cues/safety issues), 5-From toilet/BSC: Supervision (verbal cues/safety issues)  FIM - Banker Devices: Arm rests Bed/Chair Transfer: 5: Supine > Sit: Supervision (verbal cues/safety issues), 5: Sit > Supine: Supervision (verbal cues/safety issues), 5: Bed > Chair or W/C: Supervision (verbal cues/safety issues), 5: Chair or W/C > Bed: Supervision (verbal cues/safety issues)  FIM - Locomotion: Wheelchair Locomotion: Wheelchair: 0: Activity did not occur FIM - Locomotion:  Ambulation Locomotion: Ambulation Assistive Devices: Other (comment) (no AD) Ambulation/Gait Assistance: 5: Supervision Locomotion: Ambulation: 5: Travels 150 ft or more with supervision/safety issues  Comprehension Comprehension  Mode: Auditory Comprehension: 5-Understands basic 90% of the time/requires cueing < 10% of the time  Expression Expression Mode: Verbal Expression: 1-Expresses basis less than 25% of the time/requires cueing greater than 75% of the time.  Social Interaction Social Interaction: 3-Interacts appropriately 50 - 74% of the time - May be physically or verbally inappropriate.  Problem Solving Problem Solving: 1-Solves basic less than 25% of the time - needs direction nearly all the time or does not effectively solve problems and may need a restraint for safety  Memory Memory: 1-Recognizes or recalls less than 25% of the time/requires cueing greater than 75% of the time  Medical Problem List and Plan: 1. Functional deficits secondary to TBI 2. DVT Prophylaxis/Anticoagulation: Pharmaceutical: Lovenox 3. Pain Management: Will continue oxycodone prn. Will likely need premedication as unable to express needs. Will monitor for now.  4. Mood: Unable to gauge at this time due to cognitive deficits. LCSW to follow along for evaluation and support once more appropriate.  5. Neuropsych: This patient is not capable of making decisions on her own behalf. -continue in vail for safety 6. Skin/Wound Care: Pressure relief measures. Maintain adequate hydration and nutritional status as able.  7. Fluids/Electrolytes/Nutrition:megace stoppped 8. Recurrent  HCAP: Completed 10 day course of antibiotic regimen 01/17 for MSSA. now on empiric Levaquin 9. Reactive Leucocytosis: Resolving.  10. Hypokalemia: Likely due to poor nutritional status as well as dilutional .    11. Agitation: still confused,inappropriate,easily agitated   -seroquel -continue Klonopin reduced to 0.25mg  tid - librium 25mg  tid  -added depakote for further behavior control -sleep cycle overall better but still not consistent -Continue vail bed for fall prevention and due to poor insight/awareness---not ready to come out of yet  -appreciate psych assessment and input---Central  referral ----transfer potentially when medically stable  13. ?GERD/odynophagia/dysphagia----murky situation   -continue NPO for now  -discussed briefly with GI  -have ordered MRI of brain/neck (under sedation) to gain some clarity to this situation--not done yet  14. RML pneumonia---levaquin   LOS (Days) 23 A FACE TO FACE EVALUATION WAS PERFORMED  Aylen Stradford T 04/01/2014 8:26 AM

## 2014-04-01 NOTE — Anesthesia Postprocedure Evaluation (Signed)
Anesthesia Post Note  Patient: Justin CohoJeffrey L Rouch  Procedure(s) Performed: Procedure(s) (LRB): MRI BRAIN WITHOUT CONTRAST /RADIOLOGY WITH ANESTHESIA (N/A)  Anesthesia type: General  Patient location: PACU  Post pain: Pain level controlled  Post assessment: Post-op Vital signs reviewed  Last Vitals: BP 110/79 mmHg  Pulse 71  Temp(Src) 36.2 C (Oral)  Resp 14  Wt 180 lb 9.6 oz (81.92 kg)  SpO2 100%  Post vital signs: Reviewed  Level of consciousness: sedated  Complications: No apparent anesthesia complications

## 2014-04-01 NOTE — Progress Notes (Signed)
Occupational Therapy Weekly Progress Note  Patient Details  Name: Justin Pugh MRN: [id number] Date of Birth: 08/26/1955  Beginning of progress report period: March 25, 2014 End of progress report period: April 01, 2014  Today's Date: 04/01/2014 OT Individual Time: 2778-2423 OT Individual Time Calculation (min): 23 min  and Today's Date: 04/01/2014 OT Missed Time: 22 Minutes Missed Time Reason: Other (comment) (agitation)   Patient has met 2 of 4 short term goals.  Patient has made inconsistent gains during this reporting period due to increased levels of agitation during therapy. Patient currently requires supervision for all functional mobility and transfers. Patient inconsistently participates in self-care tasks, requiring mod cues for sequencing and task completion. Patient requires max-total A for sustained attention to therapeutic tasks. Patient demonstrates fluctuating levels of verbal and physical agitation, requiring max-total A for re-direction. Patient's time outside of his room is limited due to risk of elopement and risk of harming self or others. Therapy is currently focusing on sustained attention, de-escalation techniques, behavioral management, and awareness. Patient currently demonstrates behaviors consistent with Rancho Level IV.  Patient continues to demonstrate the following deficits: decreased balance, decreased intellectual awareness, decreased safety, no emergent or anticipatory awareness, decreased sequencing, poor frustration tolerance, decreased attention, decreased ability to redirect and de-escalate when agitated, poor behavioral management, bouts of agitation, decreased problem solving, decreased activity tolerance, decreased initiation, decreased memory and therefore will continue to benefit from skilled OT intervention to enhance overall performance with BADLs, safety, attention, balance, and overall cognition.  Patient is demonstrating slow progress towards  goals.  Continue plan of care. Referral made [facility] for psychiatric admission.  OT Short Term Goals Week 3:  OT Short Term Goal 1 (Week 3): Pt will identify 1 physical or cognitive deficit with max cues OT Short Term Goal 1 - Progress (Week 3): Not met OT Short Term Goal 2 (Week 3): Pt will follow behavioral management plan for out of room schedule with therapy 100% of time with max cues OT Short Term Goal 2 - Progress (Week 3): Partly met OT Short Term Goal 3 (Week 3): Pt will be oriented x2 with max cues  OT Short Term Goal 3 - Progress (Week 3): Met OT Short Term Goal 4 (Week 3): Pt will initiate 1 self-care task with min cues OT Short Term Goal 4 - Progress (Week 3): Met Week 4:  OT Short Term Goal 1 (Week 4): Pt will participate in 45 min therapy session without verbal or phyical agitation  OT Short Term Goal 2 (Week 4): Pt will identify 1 physical or cognitive deficit with max cues OT Short Term Goal 3 (Week 4): Pt will be oriented x3 with max cues  Skilled Therapeutic Interventions/Progress Updates:    Pt seen for 1:1 OT session with focus on orientation, sustained attention, and overall cognitive remediation. Therapy session completed while pt remained in enclosure bed for safety and to ensure pt adhere to NPO secondary to scheduled MRI this afternoon. Pt oriented to self and hospital, stating name of hospital. Pt denies physical or cognitive deficits, requiring total A for intellectual awareness and orientation to situation. Engaged in therapeutic conversation with pt sustaining attention up to 15 seconds. Pt verbalized enjoying tv and accurately identified 1 show, however unable to name character. Pt asking therapist to unzip bed multiple times throughout session and initially redirected with min-mod cues. Pt began attempting to manipulate therapist asking to complete oral care, bathing, change clothes, etc. Pt began escalating when therapist  would not allow pt out of  enclosure bed. Provided mental break with no external stimuli, however pt demonstrating increase in verbal agitation. Pt then began punching bed and hitting wall beside bed shouting at therapist to exit room. Pt left in enclosure bed with sitter present to de-escalate in preparation for MRI.   Therapy Documentation Precautions:  Precautions Precautions: Fall Precaution Comments: Elopement risk, agitation at times Restrictions Weight Bearing Restrictions: No General:   Vital Signs: Therapy Vitals Temp: 98 F (36.7 C) Temp Source: Oral Pulse Rate: 82 Resp: 18 BP: 112/76 mmHg Patient Position (if appropriate): Sitting Oxygen Therapy SpO2: 99 % O2 Device: Not Delivered Pain:   ADL:   Exercises:   Other Treatments:    See FIM for current functional status  Therapy/Group: Individual Therapy  Duayne Cal 04/01/2014, 6:50 AM

## 2014-04-01 NOTE — Progress Notes (Signed)
SLP Cancellation Note  Patient Details Name: Justin CohoJeffrey L Ekblad MRN: [id number] DOB: [birth year]   Cancelled treatment:       Patient missed 30 minutes of skilled SLP intervention due to off unit for MRI.                                                                                                [name] 04/01/2014, 3:34 PM

## 2014-04-01 NOTE — Progress Notes (Signed)
Physical Therapy Session Note  Patient Details  Name: Justin Pugh MRN: [id number] Date of Birth: May 29, 1955  Today's Date: 04/01/2014 PT Individual Time: 0800-0848 PT Individual Time Calculation (min): 48 min   Short Term Goals: Week 3:  PT Short Term Goal 1 (Week 3): STGs=LTGs  Skilled Therapeutic Interventions/Progress Updates:    Patient received supine in enclosure bed. Co-treatment session with Rec Therapy with emphasis on mobility and functional, work-simulated task. Patient ambulating around unit >150' several times to retrieve boxes of printer paper, locate rolling cart, and load paper onto cart. Patient then able to transport paper via rolling cart to day room to "sort" into various piles. Patient then instructed to return divided boxes of paper to copy room and distribute on shelves. Patient with increasing agitation as session progresses, likely due to commands given by therapists, despite attempting to provide suggestions instead of commands. Patient attempting to drink from water fountain and when therapist redirecting, patient becomes agitated, pushing rolling cart away from him and cursing. Patient able to be redirected to continue task. Upon additional suggestions for completion of task, patient with increased agitation, walking away from therapists quickly, making exaggerated hand movements, and cursing loudly while walking into family room.  In family room, patient obtaining cup of coffee and gathering several packages of peanut butter and graham crackers and putting pants pockets. Due to patient's history of physical agitation (punching lunch tray yesterday, throwing objects, writing on walls, etc.), this therapist opted not to redirect or restrict patient from PO intake secondary to safety concerns for staff and other patients and unknown reaction of patient. Once in room, therapists again attempting to redirect patient/educate on NPO status with patient continuing to escalate.  Patient sitting EOB, consumed one package of peanut butter and 2-3 sips of coffee (thin liquid). Through distraction/redirection, therapist able to obtain all packages of peanut butter and graham crackers from patient's pockets and take out of room. Patient instructed to return to supine and does so quickly with exaggerated movements, yelling several profanities at therapists, but remained supine in order for enclosure bed to be secured. RN and PA aware of patient status and consumption.  Therapy Documentation Precautions:  Precautions Precautions: Fall Precaution Comments: Elopement risk, agitation at times Restrictions Weight Bearing Restrictions: No Pain: Pain Assessment Pain Assessment: No/denies pain Pain Score: 0-No pain Locomotion : Ambulation Ambulation/Gait Assistance: 5: Supervision   See FIM for current functional status  Therapy/Group: Co-Treatment with Rec Therapy  Meher Kucinski S Chauntae Hults S. Forbes Loll, PT, DPT 04/01/2014, 11:20 AM

## 2014-04-01 NOTE — Progress Notes (Addendum)
Patient elopement event this afternoon at 1115.  Patient became very agitated and stormed out of room toward hallway exit and down exit stairwell.  Sitter present in patient room.  Security notified immediately. [facility] [address]  Security, assigned RN and affilated rehab staff returned patient safely to room.  Haldol administer by RN (see MAR for details).

## 2014-04-01 NOTE — Progress Notes (Signed)
Speech Language Pathology Weekly Progress Note  Patient Details  Name: Justin Pugh MRN: [id number] Date of Birth: [birth year]  Beginning of progress report period: March 24, 2014 End of progress report period: April 01, 2014  Short Term Goals: Week 3: SLP Short Term Goal 1 (Week 3): Pt will consume current diet with minimal overt s/s of aspiration with  Mod A multimodal cues for use of swallow strategies  SLP Short Term Goal 1 - Progress (Week 3): Not met SLP Short Term Goal 2 (Week 3): Pt will sustain attention to basic, familiar task in structured environment for 10 minutes with Max cues SLP Short Term Goal 2 - Progress (Week 3): Not met SLP Short Term Goal 3 (Week 3): Pt will utilize external aids and environmental cues to demonstrate orientation to place, time and situation with Max cues SLP Short Term Goal 3 - Progress (Week 3): Not met SLP Short Term Goal 4 (Week 3): Patient will participate in a 30 minute session without verbal or physical agitiation with Max A multimodal cues.  SLP Short Term Goal 4 - Progress (Week 3): Not met SLP Short Term Goal 5 (Week 3): Pt will follow one-step commands with 80% accuracy throughout basic, functional tasks in structured environment with Max cues SLP Short Term Goal 5 - Progress (Week 3): Not met    New Short Term Goals: Week 4: SLP Short Term Goal 1 (Week 4): Pt will utilize external aids and environmental cues to demonstrate orientation to place, time and situation with Max cues SLP Short Term Goal 2 (Week 4): Patient will participate in a 30 minute session without verbal or physical agitiation with Max A multimodal cues.  SLP Short Term Goal 3 (Week 4): Pt will follow one-step commands with 50% accuracy throughout basic, functional tasks in structured environment with Max cues SLP Short Term Goal 4 (Week 4): Pt will sustain attention to basic, familiar task in structured environment for 5 minutes with Max cues  Weekly Progress  Updates: Patient has made minimal progress and has not met any STG's this reporting period. Patient's progress continues to be limited by patient's verbal and physical agitation throughout the day and cooperation level. Currently, patient is demonstrating behaviors consistent with a Rancho Level IV and requires Max-Total A multimodal cues for orientation to time, place and situation, sustained attention, intellectual awareness of deficits, working memory, Surveyor, mining and functional problem solving.  Patient has also demonstrated a functional change in his swallowing function and started demonstrating overt s/s of aspiration with all consistencies.  Patient had MBS on 03/28/13 and was made NPO.  This clinician is unsure of etiology of increased swallowing difficulty due to no other obvious functional change. Physician is aware and medically managing the situation. Patient would benefit from continued skilled SLP intervention in order to maximize his cognitive and swallowing function in order to maximize his overall functional independence and reduce caregiver burden prior to discharge.   Intensity: Minumum of 1-2 x/day, 30 to 90 minutes Frequency: 3 to 5 out of 7 days Duration/Length of Stay: TBD due to SNF placement  Treatment/Interventions: Cognitive remediation/compensation;Cueing hierarchy;Dysphagia/aspiration precaution training;Environmental controls;Functional tasks;Internal/external aids;Patient/family education;Speech/Language facilitation;Therapeutic Activities    Justin Pugh, [place] 04/01/2014, 4:16 PM

## 2014-04-02 ENCOUNTER — Inpatient Hospital Stay (HOSPITAL_COMMUNITY): Payer: Self-pay | Admitting: Occupational Therapy

## 2014-04-02 ENCOUNTER — Inpatient Hospital Stay (HOSPITAL_COMMUNITY): Payer: Self-pay

## 2014-04-02 ENCOUNTER — Inpatient Hospital Stay (HOSPITAL_COMMUNITY): Payer: Medicaid Other | Admitting: Physical Therapy

## 2014-04-02 ENCOUNTER — Inpatient Hospital Stay (HOSPITAL_COMMUNITY): Payer: Medicaid Other

## 2014-04-02 DIAGNOSIS — S065X2S Traumatic subdural hemorrhage with loss of consciousness of 31 minutes to 59 minutes, sequela: Secondary | ICD-10-CM

## 2014-04-02 MED ORDER — SENNOSIDES 8.8 MG/5ML PO SYRP
10.0000 mL | ORAL_SOLUTION | Freq: Every day | ORAL | Status: DC
Start: 2014-04-02 — End: 2014-04-07
  Administered 2014-04-02 – 2014-04-06 (×4): 10 mL via ORAL
  Filled 2014-04-02 (×6): qty 10

## 2014-04-02 MED ORDER — PROPRANOLOL HCL 20 MG PO TABS
20.0000 mg | ORAL_TABLET | Freq: Three times a day (TID) | ORAL | Status: DC
  Administered 2014-04-02 – 2014-04-07 (×14): 20 mg via ORAL
  Filled 2014-04-02 (×19): qty 1

## 2014-04-02 MED ORDER — DIVALPROEX SODIUM 500 MG PO DR TAB
500.0000 mg | DELAYED_RELEASE_TABLET | Freq: Two times a day (BID) | ORAL | Status: DC
  Administered 2014-04-02 – 2014-04-07 (×9): 500 mg via ORAL
  Filled 2014-04-02 (×14): qty 1

## 2014-04-02 MED ORDER — SORBITOL 70 % SOLN
60.0000 mL | Freq: Once | Status: AC
  Administered 2014-04-02: 60 mL via ORAL

## 2014-04-02 MED ORDER — DIVALPROEX SODIUM 500 MG PO DR TAB
500.0000 mg | DELAYED_RELEASE_TABLET | Freq: Once | ORAL | Status: AC
  Administered 2014-04-02: 500 mg via ORAL
  Filled 2014-04-02: qty 1

## 2014-04-02 NOTE — Progress Notes (Signed)
Learned PHYSICAL MEDICINE & REHABILITATION     PROGRESS NOTE    Subjective/Complaints: Fairly uneventful night. Up in bed. "hungry". Denies sore throat, coughing fever or chills ROS limited due to cognition  Objective: Vital Signs: Blood pressure 100/50, pulse 88, temperature 98.9 F (37.2 C), temperature source Oral, resp. rate 18, weight 81.92 kg (180 lb 9.6 oz), SpO2 98 %. Justin Pugh  04/01/2014   CLINICAL DATA:  59 year old male with oral phase dysphagia. 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 Recent decline in swallow function. Initial encounter.  EXAM: MRI HEAD WITHOUT Pugh  TECHNIQUE: Multiplanar, multiecho pulse sequences of the brain and surrounding structures were obtained without intravenous Pugh.  COMPARISON:  Neck MRI without Pugh from today reported separately. Head CTs without Pugh 02/24/2014 and earlier.  FINDINGS: Bilateral subdural hematomas are present, maximal on the left along the anterior frontal convexity measuring up to 9 mm in thickness. On the right a smaller but more widespread subdural hematoma measures up to 6 mm in thickness.  On 02/24/2014 the subdural collections measured up to 5 mm on the left and 4 mm on the right.  Basilar cisterns are patent.  No associated midline shift.  Extensive signal abnormality in the inferior frontal bilaterally, greater on the right, and the anterior right temporal tip, compatible with multifocal hemorrhagic contusions. Susceptibility weighted images also demonstrate a greater number of bilateral hemisphere micro hemorrhages, mostly concentrated anteriorly. Notable micro hemorrhages include the right globus pallidus. There is also a small volume of subarachnoid hemosiderin (superficial siderosis) at the anterior vertex.  No restricted diffusion or evidence of acute infarction. No intraventricular hemorrhage or ventriculomegaly. Negative pituitary. Brainstem  and cerebellum are within normal limits. Negative cervicomedullary junction.  Intubated. Fluid in the pharynx. Trace mastoid effusions. Trace paranasal sinus mucosal thickening. Visible internal auditory structures appear normal. Visualized orbit soft tissues are within normal limits. Visualized scalp soft tissues are within normal limits. Bone marrow signal within normal limits.  IMPRESSION: 1. Extensive traumatic brain injury as seen on the January head CT comparisons. Since that time, left greater than right subdural hematomas have mildly enlarged; 9 mm on the left (previously 5 mm), and 6 mm on the right (previously 4 mm). 2. No associated midline shift and basilar cisterns remain patent. 3. Sequelae of multifocal shear hemorrhages as well as macroscopic anterior frontal and temporal lobe hemorrhagic contusions. Superficial siderosis along the anterior superior frontal convexities. 4. No acute infarct or new intracranial abnormality identified. 5. Intubated, with fluid in the pharynx, mastoid and paranasal sinus effusions.  Preliminary report of the above discussed with Justin Pugh at 1645 hours on 04/01/2014.   Electronically Signed   By: Justin FlemingH  Hall M.D.   On: 04/01/2014 16:55   Justin Neck Soft Tissue Only Wo Pugh  04/01/2014   CLINICAL DATA:  59 year old male with oral phase dysphagia. 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 Recent decline in swallow function. Initial encounter.  EXAM: Justin NECK SOFT TISSUE ONLY WITHOUT Pugh  TECHNIQUE: Multiplanar, multisequence Justin imaging was performed. No intravenous Pugh was administered.  COMPARISON:  Chest radiographs 03/28/2014.  FINDINGS: Study performed under anesthesia. The patient is intubated. Endotracheal tube tip visible just below the level of the thyroid.  Negative visualized lung apices. 9 mm right peritracheal lymph node is at the upper limits of normal. No superior mediastinal lymphadenopathy.   Non Pugh thyroid, larynx, pharynx, parapharyngeal spaces, retropharyngeal  space, and sublingual space are within normal limits.  Noncontrast visible submandibular and parotid glands are within normal limits.  No cervical lymphadenopathy. No soft tissue inflammation identified. Retained secretions in the nasopharynx and oropharynx in the setting of intubation. Mild right greater than left mastoid effusions.  Cervicomedullary junction is within normal limits. Chronic cervical disc degeneration at C4-C5 and C5-C6 with disc protrusions but no definite cervical spinal stenosis. Spinal cord signal is within normal limits at all visualized levels. Visualized bone marrow signal is within normal limits.  IMPRESSION: 1. Negative non Pugh MRI appearance of the neck soft tissues. 2. Intubated. 3. Chronic cervical disc degeneration at C4-C5 and C5-C6 with disc herniations but no definite spinal stenosis.   Electronically Signed   By: Justin Fleming M.D.   On: 04/01/2014 16:39   No results for input(s): WBC, HGB, HCT, PLT in the last 72 hours. No results for input(s): NA, K, CL, GLUCOSE, BUN, CREATININE, CALCIUM in the last 72 hours.  Invalid input(s): CO CBG (last 3)  No results for input(s): GLUCAP in the last 72 hours.  Wt Readings from Last 3 Encounters:  04/01/14 81.92 kg (180 lb 9.6 oz)  03/09/14 84.188 kg (185 lb 9.6 oz)  01/23/14 95.255 kg (210 lb)    Physical Exam:  Constitutional: He appears well-developed and well-nourished. He is sleeping. He is easily aroused.  HENT:  Head: Normocephalic.  Neck: supple Cardiovascular: Normal rate and regular rhythm.  Respiratory: Effort normal. No respiratory distress.  GI: Soft. Bowel sounds are normal. He exhibits no distension. There is no tenderness.  Musculoskeletal: He exhibits no edema.  Neurological: He is alert. Good phonation. A little hoarse  restless.   Moves all extremities without difficulty. equally moves all 4's. Senses pain in all 4.  Oriented to name only. confabulates Skin: Skin is warm and dry.  Psychiatric:  Confused   Assessment/Plan: 1. 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 Physiatrist is providing close team supervision and 24 hour management of active medical problems listed below. Physiatrist and rehab team continue to assess barriers to discharge/monitor patient progress toward functional and medical goals.  Have reduced therapies to QD given plateau, patient cooperation.   FIM: FIM - Bathing Bathing Steps Patient Completed: Chest, Abdomen, Front perineal area, Right Arm, Left Arm, Buttocks, Right upper leg, Left upper leg, Left lower leg (including foot), Right lower leg (including foot) Bathing: 5: Supervision: Safety issues/verbal cues  FIM - Upper Body Dressing/Undressing Upper body dressing/undressing steps patient completed: Thread/unthread right sleeve of pullover shirt/dresss, Thread/unthread left sleeve of pullover shirt/dress, Put head through opening of pull over shirt/dress, Pull shirt over trunk Upper body dressing/undressing: 5: Supervision: Safety issues/verbal cues FIM - Lower Body Dressing/Undressing Lower body dressing/undressing steps patient completed: Thread/unthread right underwear leg, Thread/unthread left underwear leg, Pull underwear up/down, Thread/unthread right pants leg, Thread/unthread left pants leg, Pull pants up/down, Don/Doff right sock, Don/Doff left sock, Don/Doff right shoe, Fasten/unfasten left shoe, Fasten/unfasten right shoe, Don/Doff left shoe Lower body dressing/undressing: 5: Set-up assist to: Obtain clothing  FIM - Toileting Toileting steps completed by patient: Adjust clothing prior to toileting, Performs perineal hygiene, Adjust clothing after toileting Toileting Assistive Devices: Grab bar or rail for support Toileting: 4: Steadying assist  FIM - Ambulance person Devices: Grab bars Toilet Transfers: 5-To toilet/BSC: Supervision (verbal cues/safety issues), 5-From toilet/BSC: Supervision (verbal cues/safety issues)  FIM - Games developer  Transfer Assistive Devices: Arm rests Bed/Chair Transfer: 5: Supine > Sit: Supervision (verbal cues/safety issues), 5: Sit > Supine: Supervision (verbal cues/safety issues), 5: Bed > Chair or W/C: Supervision (verbal cues/safety issues), 5: Chair or W/C > Bed: Supervision (verbal cues/safety issues)  FIM - Locomotion: Wheelchair Locomotion: Wheelchair: 0: Activity did not occur FIM - Locomotion: Ambulation Locomotion: Ambulation Assistive Devices: Other (comment) (none) Ambulation/Gait Assistance: 5: Supervision Locomotion: Ambulation: 5: Travels 150 ft or more with supervision/safety issues  Comprehension Comprehension Mode: Auditory Comprehension: 5-Follows basic conversation/direction: With extra time/assistive device  Expression Expression Mode: Verbal Expression: 2-Expresses basic 25 - 49% of the time/requires cueing 50 - 75% of the time. Uses single words/gestures.  Social Interaction Social Interaction: 3-Interacts appropriately 50 - 74% of the time - May be physically or verbally inappropriate.  Problem Solving Problem Solving: 1-Solves basic less than 25% of the time - needs direction nearly all the time or does not effectively solve problems and may need a restraint for safety  Memory Memory: 3-Recognizes or recalls 50 - 74% of the time/requires cueing 25 - 49% of the time  Medical Problem List and Plan: 1. Functional deficits secondary to TBI 2. DVT Prophylaxis/Anticoagulation: Pharmaceutical: Lovenox 3. Pain Management: Will continue oxycodone prn. Will likely need premedication as unable to express needs. Will monitor for now.  4. Mood: Unable to gauge at this time due to cognitive deficits. LCSW to follow along for evaluation and support  once more appropriate.  5. Neuropsych: This patient is not capable of making decisions on her own behalf. -continue in vail for safety 6. Skin/Wound Care: Pressure relief measures. Maintain adequate hydration and nutritional status as able.  7. Fluids/Electrolytes/Nutrition:megace stoppped 8. Recurrent  HCAP: Completed 10 day course of antibiotic regimen 01/17 for MSSA. now on empiric Levaquin 9. Reactive Leucocytosis: Resolving.  10. Hypokalemia: Likely due to poor nutritional status as well as dilutional .    11. Agitation: still confused,inappropriate,easily agitated  -seroquel -continue Klonopin reduced to 0.25mg  tid - librium  tid  -added depakote for further behavior control -sleep cycle overall better but still not consistent -Continue vail bed for fall prevention and due to poor insight/awareness---not ready to come out of yet  -appreciate psych assessment and input---[facility]  referral ----transfer potentially when medically stable  13. ?GERD/odynophagia/dysphagia----murky situation still  -continue NPO for now  -MRI with mild increase in ventricle size. Neck unremarkable  -will try for barium swallow to see if there is a dynamic process going on  -eliminate haldol IM 14. RML pneumonia---levaquin   LOS (Days) 24 A FACE TO FACE EVALUATION WAS PERFORMED  Byard Carranza T 04/02/2014 8:16 AM

## 2014-04-02 NOTE — Progress Notes (Signed)
Social Work Patient ID: Janeice Robinson, male   DOB: 1955/06/19, 59 y.o.   MRN: [id number]   Met with pt's mother yesterday to review status of d/c planning.  Informed her that team reports very little change in pt/ behavior since last week.  Now there are some concerns about his swallow that are being addressed.  Mother has good, general understanding of pt's level of agitation and his periods of more aggressive behaviors with staff.  Explained to mother that, per psychiatry recommendation, we have begun process to try an get pt transferred to [facility] (a psychiatric inpatient program in [place], [place]).  She is in agreement with this plan.    I have faxed requested medical documentation to [facility] today, however, pt still not even on the waitlist.  Per acute, psych service CSW, Nonnie Done, the hope is that we might get on waitlist within the next couple of days.  Unfortunately, once on the waitlist the "wait time" could be a minimum of two weeks.  I will keep team posted on progress with this.   Dayleen Beske, LCSW

## 2014-04-02 NOTE — Progress Notes (Signed)
Speech Language Pathology Daily Session Note  Patient Details  Name: Justin Pugh MRN: [id number] Date of Birth: 04-Sep-1955  Today's Date: 04/02/2014 SLP Individual Time: 1115-1200 SLP Individual Time Calculation (min): 45 min  Short Term Goals: Week 4: SLP Short Term Goal 1 (Week 4): Pt will utilize external aids and environmental cues to demonstrate orientation to place, time and situation with Max cues SLP Short Term Goal 2 (Week 4): Patient will participate in a 30 minute session without verbal or physical agitiation with Max A multimodal cues.  SLP Short Term Goal 3 (Week 4): Pt will follow one-step commands with 50% accuracy throughout basic, functional tasks in structured environment with Max cues SLP Short Term Goal 4 (Week 4): Pt will sustain attention to basic, familiar task in structured environment for 5 minutes with Max cues  Skilled Therapeutic Interventions: Skilled treatment session focused on dysphagia and cognitive goals. SLP facilitated session by providing skilled observation with snack of Dys. 1 textures with nectar-thick liquids. Patient demonstrated an intermittent wet vocal quality that cleared with cued throat clears.  Patient continues to demonstrate a delayed swallow initiation with multiple swallows and anterior spillage, however, patient's swallowing appeared more timely overall and patient demonstrated increased ability to utilize swallowing compensatory strategies. Patient also appeared brighter and was calm and cooperative throughout the session.  Recommend patient continue current diet with strict full supervision for use of swallowing compensatory strategies. Hopeful that swallow will continue to improve as his overall mentation improves. Patient left in room with sitter present. Continue with current plan of care.    FIM:  Comprehension Comprehension Mode: Auditory Comprehension: 5-Follows basic conversation/direction: With extra time/assistive  device Expression Expression Mode: Verbal Expression: 2-Expresses basic 25 - 49% of the time/requires cueing 50 - 75% of the time. Uses single words/gestures. Social Interaction Social Interaction: 3-Interacts appropriately 50 - 74% of the time - May be physically or verbally inappropriate. Problem Solving Problem Solving: 1-Solves basic less than 25% of the time - needs direction nearly all the time or does not effectively solve problems and may need a restraint for safety Memory Memory: 2-Recognizes or recalls 25 - 49% of the time/requires cueing 51 - 75% of the time FIM - Eating Eating Activity: 5: Supervision/cues  Pain Pain Assessment Pain Assessment: No/denies pain  Therapy/Group: Individual Therapy  Saraia Platner 04/02/2014, 3:21 PM

## 2014-04-02 NOTE — Plan of Care (Signed)
Problem: RH PAIN MANAGEMENT Goal: RH STG PAIN MANAGED AT OR BELOW PT'S PAIN GOAL At or below 5  Outcome: Progressing No c/o pain     

## 2014-04-02 NOTE — Progress Notes (Signed)
Physical Therapy Session Note  Patient Details  Name: Justin Pugh MRN: [id number] Date of Birth: 22-Jun-1955  Today's Date: 04/02/2014 PT Individual Time: [id number] PT Individual Time Calculation (min): 30 min   Short Term Goals: Week 3:  PT Short Term Goal 1 (Week 3): STGs=LTGs  Skilled Therapeutic Interventions/Progress Updates:    Patient seen for skilled therapy session with physical therapy supervisor to address sustained attention, tolerance to therapists, and orientation. Patient received in enclosure bed, pleasant and conversational with therapists. Patient consistently complained of constipation and stomach discomfort and went in/out of bathroom multiple times, RN aware. Patient required one cue for item retrieval and was able to sustain attention to simple sorting task seated edge of bed x 5 min with min cues for redirection. As patient demonstrated decreased tolerance to commands from therapist for task (leaning away, stating, "You can finish," etc), therapist terminated task to avoid escalation. Patient with increased restlessness and ambulated throughout room with distant supervision and no overt LOB with dynamic balance challenge. Patient demonstrated different exercise to therapists when asked to recall balance challenge after less than 10 min. Patient oriented to self and birth date but did not engage in attempt at structured orientation conversation. Patient perseverated on leaving room to get a cup/looking for cup in room but was easily redirected. Patient declined to clear throat due to increased wet vocal quality when cued. RN present before patient returned to bed to assess vitals and administer medication with apple sauce, mod cues for small bites. Patient remained calm and cooperative throughout session. Patient easily directed to enclosure bed at end of session and left supine in bed with RN present.    Therapy Documentation Precautions:  Precautions Precautions:  Fall Precaution Comments: Elopement risk, agitation at times Restrictions Weight Bearing Restrictions: No Vital Signs: Therapy Vitals Pulse Rate: 95 Resp: 18 BP: 117/70 mmHg Patient Position (if appropriate): Sitting Pain: Pain Assessment Pain Assessment: No/denies pain Locomotion : Ambulation Ambulation/Gait Assistance: 5: Supervision   See FIM for current functional status  Therapy/Group: Individual Therapy  [name], [name] 04/02/2014, 2:50 PM

## 2014-04-02 NOTE — Progress Notes (Signed)
Occupational Therapy Note  Patient Details  Name: Katrine CohoJeffrey L Siefker MRN: [id number] Date of Birth: February 08, 1956  Today's Date: 04/02/2014 OT Missed Time: 45 Minutes Missed Time Reason: Unavailable (comment);X-Ray  Patient missing 45 min skilled occupational therapy secondary to reducing external stimuli that facilitate agitation in prep for X-ray and patient off unit for x-ray. Will follow-up as able.   Marks Scalera N 04/02/2014, 10:15 AM

## 2014-04-02 NOTE — Progress Notes (Signed)
ST/OT report evidence of more appropriate behavior. He is showing "some" improvement in voice quality as well as swallow and ST would like to re-evaluate patient in am to monitor for any further improvement in 24 hours to help make decision on appropriate diet as well as need for alternative means of nutrition.

## 2014-04-02 NOTE — Plan of Care (Signed)
Problem: RH SAFETY Goal: RH STG ADHERE TO SAFETY PRECAUTIONS W/ASSISTANCE/DEVICE STG Adhere to Safety Precautions With min Assistance/Device.  Outcome: Not Progressing Pt in an enclosure bed and has a Recruitment consultantsafety sitter.
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
## 2014-04-03 ENCOUNTER — Inpatient Hospital Stay (HOSPITAL_COMMUNITY): Payer: Self-pay | Admitting: Speech Pathology

## 2014-04-03 ENCOUNTER — Encounter (HOSPITAL_COMMUNITY): Payer: Self-pay | Admitting: Radiology

## 2014-04-03 ENCOUNTER — Encounter (HOSPITAL_COMMUNITY): Payer: Self-pay

## 2014-04-03 ENCOUNTER — Inpatient Hospital Stay (HOSPITAL_COMMUNITY): Payer: Medicaid Other | Admitting: *Deleted

## 2014-04-03 LAB — VALPROIC ACID LEVEL: VALPROIC ACID LVL: 46.4 ug/mL — AB (ref 50.0–100.0)
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
## 2014-04-03 NOTE — Progress Notes (Addendum)
Nursing Note: No void this shift. Offered to go to the bathroom but pt declined.Fluids given x2. Pt likes cranberry jiuce mixed w/ apple juice.Slept well after awke once and a little agitated wanting to get out of bed.wbb

## 2014-04-03 NOTE — Progress Notes (Signed)
I was asked to review this patient's MRI of the brain. It shows small extraction of fluid collections left greater than right. There is no significant mass effect or shift. These need no acute intervention and he should be safe for discharge to a facility.

## 2014-04-03 NOTE — Progress Notes (Signed)
Physical Therapy Weekly Progress Note  Patient Details  Name: Justin Pugh MRN: [id number] Date of Birth: 1955-06-09  Beginning of progress report period: March 25, 2014 End of progress report period: April 03, 2014  Today's Date: 04/03/2014 PT Individual Time: 1638-4536 PT Individual Time Calculation (min): 42 min   Patient has met 7 of 14 long term goals. Patient is currently functioning at overall mod I-supervision level with all functional mobility in a controlled environment. Patient is currently demonstrating behaviors consistent with [place] Level IV. Patient with fluctuating levels of verbal and physical agitation. Currently, patient is at high risk for elopement as well as at risk for harming himself or others due to his behavior. At this time, patient's time outside his room is limited due to various safety concerns and focus of physical therapy has been on attention, awareness, and behavioral management with emphasis on de-escalation techniques.  Patient continues to demonstrate the following deficits: poor activity tolerance, decreased ability to compensate for deficits, decreased sustained attention, decreased intellectual awareness, no emergent awareness, no anticipatory awareness, decreased safety awareness, bouts of agitation, poor behavioral management, inability to redirect, inability to engage in therapeutic tasks, decreased ability to de-escalate when agitated, and therefore will continue to benefit from skilled PT intervention to enhance overall performance with activity tolerance, balance, ability to compensate for deficits, attention, awareness, knowledge of precautions and behavioral management.  See Patient's Care Plan for progression toward long term goals. Patient making minimal progress toward long term goals.. Continue plan of care.  Skilled Therapeutic Interventions/Progress Updates:    Patient received supine in enclosure bed. Session focused on functional  mobility within room, sustained attention and safety with eating breakfast. Patient mod I within controlled environment of room, continues to require 1:1 sitter, enclosure bed, and therapy maintained in room secondary to poor safety, poor awareness, elopement risk, and modified diet with patient attempts to consume unsafe textures. Patient requires mod-max cues for appropriate bite/sip size, but unable to follow these commands and with increasing agitation with commands provided. Patient coughing throughout meal and when does consume food or liquid, at times, holding in mouth for 25-30" before swallowing. SLP notified of patient status during PO intake. Patient with increasing agitation when cued to cough to the point where he refused blood thinner shot and began to throw clothes around room. Patient left ambulating around room with OT arrival, sitter present. Provided patient with magazines to occupy time and facilitate sustained attention after patient stating he wanted to go to [employer].  Therapy Documentation Precautions:  Precautions Precautions: Fall Precaution Comments: Elopement risk, agitation at times Restrictions Weight Bearing Restrictions: No Pain: Pain Assessment Pain Assessment: No/denies pain Pain Score: 0-No pain Locomotion : Ambulation Ambulation/Gait Assistance: 6: Modified independent (Device/Increase time)    See FIM for current functional status  Therapy/Group: Individual Therapy  [name]. Clary Boulais, PT, DPT 04/03/2014, 8:56 AM

## 2014-04-03 NOTE — Progress Notes (Signed)
Nursing Note: Pt asleep.wbb 
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
## 2014-04-03 NOTE — Progress Notes (Signed)
Was called by Justin ReiningPamela Love, PA-C, regarding permanent enteral access for this patient. He is apparently aspirating and, while he did make some progress, he regressed and needs some other way to get enteral feeds. The rehab team has had long talks with mom and the plan they've come up with is to place a PEG with the hope that he'll continue to improve. She understands that given his agitation it's very possible that he'll pull this out and need a laparotomy with open tube placement. The team and she feel that's a better risk than trying to feed him. Endoscopy is closed now so we will plan to set something up Monday morning for later that day or Tuesday. We'll likely need anesthesia help in endo for the procedure.    Justin CaldronMichael J. Neil Errickson, PA-C Pager: [phone] General Trauma PA Pager: [phone]

## 2014-04-03 NOTE — Progress Notes (Addendum)
Occupational Therapy Session Note  Patient Details  Name: Justin Pugh MRN: [id number] Date of Birth: [birth year]  Today's Date: 04/03/2014 OT Individual Time: 4497-5300 and 5110-2111 OT Individual Time Calculation (min): 11 min and 12 min     Short Term Goals: Week 3:  OT Short Term Goal 1 (Week 3): Pt will identify 1 physical or cognitive deficit with max cues OT Short Term Goal 1 - Progress (Week 3): Not met OT Short Term Goal 2 (Week 3): Pt will follow behavioral management plan for out of room schedule with therapy 100% of time with max cues OT Short Term Goal 2 - Progress (Week 3): Partly met OT Short Term Goal 3 (Week 3): Pt will be oriented x2 with max cues  OT Short Term Goal 3 - Progress (Week 3): Met OT Short Term Goal 4 (Week 3): Pt will initiate 1 self-care task with min cues OT Short Term Goal 4 - Progress (Week 3): Met  Skilled Therapeutic Interventions/Progress Updates:    Session 1: Pt seen for 1:1 OT session with focus on de-escalation, sustained attention, and tolerance of therapist's presence. Pt received following PT session with report of PT of escalating behaviors. Pt redirected to sitting EOB with use of magazines. Pt sustained attention to magazines, flipping through page quickly for 10 min. Pt sharing magazine with therapist and demonstrating no signs of agitation. Pt with 2 episodes of coughing with no PO intake. Pt encouraged to clear throat and pt began escalating. Pt raising voice stating "you cannot boss me around." Pt stood 2x then transitioned to supine in bed. Pt left in enclosure bed to de-escalate with sitter present. Pt missing 34 min skilled OT to allow for de-escalation. Will follow-up as able.   Session 2: Pt seen for 1:1 OT session with focus on functional mobility, sustained attention, tolerance of therapist's presence and de-escalation. Pt received supine in enclosure bed asleep and easily aroused. Pt very pleasant upon arousal. Engaged in  orientation questions with pt verbalizing city then hospital with min cues. Ambulated around room looking for shoes and picking up clothing to place in drawers. Pt agreeable to card game, however requesting to play "at [employer]." Reoriented pt to hospital and completing therapy in room. Pt remaining in room at this time due to elopement risk and modified diet as pt attempts to consume foods of unsafe textures. Pt immediately became agitated and throwing cards on floor. Pt ambulated around room short time then re-directed to enclosure bed with mod cues. Pt left supine in enclosure bed to allow for de-escalation. Pt continues to demonstrate wet vocal quality throughout session without PO intake. Pt still missing 22 min skilled OT due to agitation and allowing for de-escalation. Will follow-up as able.  Therapy Documentation Precautions:  Precautions Precautions: Fall Precaution Comments: Elopement risk, agitation at times Restrictions Weight Bearing Restrictions: No General: General OT Amount of Missed Time: 34 Minutes Vital Signs:  Pain: Pain Assessment Pain Assessment: No/denies pain Pain Score: 0-No pain  See FIM for current functional status  Therapy/Group: Individual Therapy  Duayne Cal 04/03/2014, 9:56 AM

## 2014-04-03 NOTE — Plan of Care (Signed)
Problem: RH SAFETY Goal: RH STG ADHERE TO SAFETY PRECAUTIONS W/ASSISTANCE/DEVICE STG Adhere to Safety Precautions With min Assistance/Device.  Outcome: Not Progressing Enclosure bed for safety,flight risk,unsteady on fett

## 2014-04-03 NOTE — Progress Notes (Signed)
Nursing Note: Pt awake and yelling.Wants to get out of enclosure bed.Asks for a cigarette.1:1 w/ pt to calm pt so he can get back to sleep.Sat with pt for 15 minutes, but pt talked the whole time.Stepped out the room for some quiet time,in hopes that pt will go back to sleep.wbb

## 2014-04-03 NOTE — Progress Notes (Signed)
Updated mother on decline in swallow function, inability to participate in safe swallow strategies, results of MRI as well as our concerns. Patient is at risk for an aspiration event causing respiratory failure and leading to  intubation. Informed her that patient is currently on Levaquin for aspiration PNA but needs to be NPO with alternative means of nutrition i.e. PEG tube. She is willing to continue current diet diet with known risk until PEG is placed as is aware that he will pull out IV's "like he did on the other floor even with restraints"  She is aware that patient might pull out PEG but wants to try and do what is best for her son.  If patient pulls PEG out then she will have to come to terms that she has done everything that was appropriated. She continues to hope that there might be some improvement over next few days but aware that that is unlikely.

## 2014-04-03 NOTE — Plan of Care (Signed)
Problem: RH Balance Goal: LTG Patient will maintain dynamic sitting balance (PT) LTG: Patient will maintain dynamic sitting balance with assistance during mobility activities (PT)  Mod I in controlled environment with 24/7 supervision, enclosure bed, 1:1 sitter for safety due to agitation and elopement risk Goal: LTG Patient will maintain dynamic standing balance (PT) LTG: Patient will maintain dynamic standing balance with assistance during mobility activities (PT)  Mod I in controlled environment with 24/7 supervision, enclosure bed, 1:1 sitter for safety due to agitation and elopement risk  Problem: RH Bed Mobility Goal: LTG Patient will perform bed mobility with assist (PT) LTG: Patient will perform bed mobility with assistance, with/without cues (PT).  Mod I in controlled environment with 24/7 supervision, enclosure bed, 1:1 sitter for safety due to agitation and elopement risk  Problem: RH Bed to Chair Transfers Goal: LTG Patient will perform bed/chair transfers w/assist (PT) LTG: Patient will perform bed/chair transfers with assistance, with/without cues (PT).  Mod I in controlled environment with 24/7 supervision, enclosure bed, 1:1 sitter for safety due to agitation and elopement risk  Problem: RH Furniture Transfers Goal: LTG Patient will perform furniture transfers w/assist (OT/PT LTG: Patient will perform furniture transfers with assistance (OT/PT).  Mod I in controlled environment with 24/7 supervision, enclosure bed, 1:1 sitter for safety due to agitation and elopement risk  Problem: RH Floor Transfers Goal: LTG Patient will perform floor transfers w/assist (PT) LTG: Patient will perform floor transfers with assistance (PT).  Mod I in controlled environment with 24/7 supervision, enclosure bed, 1:1 sitter for safety due to agitation and elopement risk  Problem: RH Ambulation Goal: LTG Patient will ambulate in controlled environment (PT) LTG: Patient will ambulate in a  controlled environment, # of feet with assistance (PT).  Mod I in controlled environment with 24/7 supervision, enclosure bed, 1:1 sitter for safety due to agitation and elopement risk  Problem: RH Stairs Goal: LTG Patient will ambulate up and down stairs w/assist (PT) LTG: Patient will ambulate up and down # of stairs with assistance (PT)  Mod I in controlled environment with 24/7 supervision, enclosure bed, 1:1 sitter for safety due to agitation and elopement risk

## 2014-04-03 NOTE — Progress Notes (Signed)
Speech Language Pathology Daily Session Note  Patient Details  Name: Justin Pugh MRN: [id number] Date of Birth: 04/13/1955  Today's Date: 04/03/2014 SLP Individual Time: 1115-1130 SLP Individual Time Calculation (min): 15 min and Today's Date: 04/03/2014 SLP Missed Time: 30 Minutes Missed Time Reason: Patient fatigue  Short Term Goals: Week 4: SLP Short Term Goal 1 (Week 4): Pt will utilize external aids and environmental cues to demonstrate orientation to place, time and situation with Max cues SLP Short Term Goal 2 (Week 4): Patient will participate in a 30 minute session without verbal or physical agitiation with Max A multimodal cues.  SLP Short Term Goal 3 (Week 4): Pt will follow one-step commands with 50% accuracy throughout basic, functional tasks in structured environment with Max cues SLP Short Term Goal 4 (Week 4): Pt will sustain attention to basic, familiar task in structured environment for 5 minutes with Max cues  Skilled Therapeutic Interventions: Skilled treatment session focused on cognitive goals. Upon arrival, patient was sitting upright in the recliner and appeared lethargic. Sitter reported patient had recently been verbally agitated and trying to leave the room.  SLP facilitated session by providing Total A multimodal cues for functional problem solving for proper use of the television remote to locate a movie channel. Patient was falling asleep during task and was encouraged to return to the enclosure bed to rest, patient was cooperative and returned to the enclosure bed without incident. Patient left to nap with enclosure bed closed and sitter present.    Of note, both the PT and sitter report patient had severe overt s/s of aspiration with breakfast meal of Dys. 1 textures with nectar-thick liquids and was verbally frustrated when cued for utilization of swallowing compensatory strategies of small bites,  a slow rate and a cued throat clears to reduce his wet vocal  quality. Spoke with the PA, Justin NestlePam Pugh, who reported that the physician had attempted to call the patient's mother who is the patient's next of kin and had left a message in regards to his current swallowing function and current plan of care in regards to possible PEG or continue to allow the patient to consume his current diet knowing the risks, especially in regards to aspiration. Awaiting patient's mother to return the physician's phone call.   FIM:  Comprehension Comprehension Mode: Auditory Comprehension: 5-Follows basic conversation/direction: With extra time/assistive device Expression Expression Mode: Verbal Expression: 3-Expresses basic 50 - 74% of the time/requires cueing 25 - 50% of the time. Needs to repeat parts of sentences. Social Interaction Social Interaction: 2-Interacts appropriately 25 - 49% of time - Needs frequent redirection. Problem Solving Problem Solving: 1-Solves basic less than 25% of the time - needs direction nearly all the time or does not effectively solve problems and may need a restraint for safety Memory Memory: 1-Recognizes or recalls less than 25% of the time/requires cueing greater than 75% of the time  Pain Pain Assessment Pain Assessment: No/denies pain Pain Score: 0-No pain  Therapy/Group: Individual Therapy  Justin Pugh 04/03/2014, 11:50 AM

## 2014-04-03 NOTE — Progress Notes (Signed)
IVR does not do procedures under general and recommends J-tube if aspiration is a concern. Spoke with Justin Pugh/Trauma PA regarding need for J-tube under general anesthesia. They will evaluate patient and plan for procedure next week.

## 2014-04-03 NOTE — Progress Notes (Addendum)
IR PA aware of request for G-tube, given that the patient is combative and required general anesthesia for recent MRI and ordering MD request G-tube be performed under general anesthesia we would recommend a GI consult for PEG that is routinely performed with anesthesia or surgical consult.   Pattricia BossKoreen Brittinie Wherley PA-C Interventional Radiology  04/03/14  4:07 PM

## 2014-04-04 ENCOUNTER — Inpatient Hospital Stay (HOSPITAL_COMMUNITY): Payer: Medicaid Other | Admitting: Physical Therapy

## 2014-04-04 ENCOUNTER — Encounter (HOSPITAL_COMMUNITY): Payer: Self-pay

## 2014-04-04 NOTE — Progress Notes (Signed)
Physical Therapy Session Note  Patient Details  Name: Justin CohoJeffrey L Mells MRN: [id number] Date of Birth: February 20, 1956  Today's Date: 04/04/2014 PT Individual Time: 0900-0930 PT Individual Time Calculation (min): 30 min   Short Term Goals: Week 4:  PT Short Term Goal 1 (Week 4): STGs=LTGs  Skilled Therapeutic Interventions/Progress Updates:    Neuromuscular Reeducation: Focus of session is cognitive reeducation with goal of improving sustained attention to task. Pt received lying down in open enclosure bed with nurse tech present - PT tech present as well, as pt appears less agitated with multiple people present. PT initiated conversation and pt initially willing to play a card game with PT - pt quickly attempts to direct PT to assist pt out of room in multiple ways. Pt perseverates on feeling sick and needing for PT to call the hospital. Pt ambulates within room mod I, stating he needs to use the bathroom - no voiding occurs, but PT notes that light in bathroom is burned out and Chiropodistnotifies nurse tech. PT places chair in front of door and sits calmly while pt ambulates around room, then pt calms down and returns to lying down in bed. PT redirects pt back to card game and pt becomes agitated. PT discusses pt's prior living location and prior hobbies (such as hunting) with pt and he becomes agitated again and stops speaking to this PT. However, PT Tech is able to converse with pt slightly re: hunting activities. Pt gets very angry, raises voice, demands that PT calls the hospital because he's sick and needs tests run. PT steps into pt's bathroom to simulate a phone call to the hospital. Afterward, PT explains to pt that since he is already in the hospital, the nurse on this unit will care for him. Pt has further increase in agitation, raises voice, demands that PT leaves the room, and expression on pt's face changes to a stone cold stare. PT zips pt up in enclosure bed and connects zipper with circle ring for  safety to allow pt to calm down. PT and PT tech leave room.   Pt demonstrates physical and verbal agitation intermittently throughout PT session - is able to calm down when PT does not press an issue. Pt perseverates on feeling sick - runny nose and congestion, but these symptoms are most likely due to pt's aspiration problems and nurse tech reports pt had just eaten breakfast. Pt with poor attention to task, which is conversing on a single topic, during this AM PT session. Pt is able to maintain focus for up to 30 seconds before either becoming agitated or reverting to perseveration on feeling sick or exit seeking from room. Continue per PT POC.   Therapy Documentation Precautions:  Precautions Precautions: Fall Precaution Comments: Elopement risk, agitation at times Restrictions Weight Bearing Restrictions: No General: PT Amount of Missed Time (min): 15 Minutes PT Missed Treatment Reason: Increased agitation Pain: Pain Assessment Pain Assessment: No/denies pain Locomotion : Ambulation Ambulation/Gait Assistance: 6: Modified independent (Device/Increase time)   See FIM for current functional status  Therapy/Group: Individual Therapy with PT Clyde Lundborgech Stephen and Nurse Tech LeeAnn present   [facility] CenterYSLOP,Omran Keelin M 04/04/2014, 9:49 AM

## 2014-04-04 NOTE — Progress Notes (Signed)
Occupational Therapy Note  Patient Details  Name: Justin CohoJeffrey L Kyne MRN: [id number] Date of Birth: October 08, 1955  Today's Date: 04/04/2014 OT Missed Time: 45 Minutes Missed Time Reason: Other (comment) (agitation)  Patient missing 45 min skilled occupational therapy secondary to agitation and allowing for de-escalation. Patient received in enclosure bed immediately cursing and yelling at therapist. Attempted to re-direct however pt continued escalating and hitting wall. Patient left in enclosure bed with sitter present to allow for de-escalation. Will follow-up as able.   Corinthia Helmers N 04/04/2014, 12:02 PM

## 2014-04-04 NOTE — Progress Notes (Signed)
Patient ID: Justin Pugh, male   DOB: 02/03/1956, 59 y.o.   MRN: [id number]   Patient ID: Justin Pugh, male   DOB: 02/03/1956, 59 y.o.   MRN: [id number]   [place] PHYSICAL MEDICINE & REHABILITATION     PROGRESS NOTE  04/04/13.  59 y/o admit for CIR with functional deficits secondary to TBI    Subjective/Complaints: Remains agitated and confused.  Remains in Enclosure bed  ROS limited due to cognition     Patient Vitals for the past 24 hrs:  BP Temp Temp src Pulse Resp SpO2  04/04/14 0438 122/78 mmHg - - 81 18 99 %  04/03/14 2247 116/64 mmHg - - 81 - -  04/03/14 1448 108/70 mmHg 98.6 F (37 C) Oral 74 18 98 %     Intake/Output Summary (Last 24 hours) at 04/04/14 0945 Last data filed at 04/03/14 1500  Gross per 24 hour  Intake    240 ml  Output      0 ml  Net    240 ml      Objective: Vital Signs: Blood pressure 122/78, pulse 81, temperature 98.6 F (37 C), temperature source Oral, resp. rate 18, weight 182 lb 3.2 oz (82.645 kg), SpO2 99 %. Dg Chest 2 View  04/02/2014   CLINICAL DATA:  Subsequent evaluation for shortness of breath and reflux  EXAM: CHEST  2 VIEW  COMPARISON:  03/28/2014  FINDINGS: There is some residual right middle lobe opacity although it is moderately improved when compared to the prior study.  The heart size and vascular pattern are normal. The left lung shows mild linear opacities in the perihilar region suggesting mild atelectasis. No pleural effusion.  IMPRESSION: Persistent but moderately improved right middle lobe infiltrate. Suggest radiographic followup to ensure complete resolution.   Electronically Signed   By: Esperanza Heiraymond  Rubner M.D.   On: 04/02/2014 11:04   Dg Esophagus  04/02/2014   CLINICAL DATA:  Dysphagia.  Reflux.  Aspiration.  Coughing.  EXAM: ESOPHOGRAM/BARIUM SWALLOW  TECHNIQUE: Single contrast examination was performed using thin barium and barium tablet.  FLUOROSCOPY TIME:  1 min 0 seconds  COMPARISON:  None.  FINDINGS: The  patient had recurrent episodes of laryngeal penetration with at least 1 episode of aspiration. There is no stricture or mass. There is no hiatal hernia. No esophagitis.  The patient attempted to swallow the barium tablet but due to oral stage difficulty was not able to swallow of the tablet.  IMPRESSION: Laryngeal penetration and aspiration with thin liquids. Otherwise normal barium esophagram. The patient could not swallow the barium tablet.   Electronically Signed   By: Francene BoyersJames  Maxwell M.D.   On: 04/02/2014 10:42   No results for input(s): WBC, HGB, HCT, PLT in the last 72 hours. No results for input(s): NA, K, CL, GLUCOSE, BUN, CREATININE, CALCIUM in the last 72 hours.  Invalid input(s): CO    Wt Readings from Last 3 Encounters:  04/03/14 182 lb 3.2 oz (82.645 kg)  03/09/14 185 lb 9.6 oz (84.188 kg)  01/23/14 210 lb (95.255 kg)    Physical Exam:  Constitutional: He appears well-developed and well-nourished. He is sleeping. He is easily aroused.  HENT:  Head: Normocephalic.  Neck: supple Cardiovascular: Normal rate and regular rhythm.  Respiratory: Effort normal. No respiratory distress.  GI: Soft. Bowel sounds are normal. He exhibits no distension. There is no tenderness.  Musculoskeletal: He exhibits no edema.  Neurological: He is alert. Good phonation  restless.  Moves all extremities without difficulty. Left side less spontaneous with movement than right but he still moves all 4's. Senses pain in all 4. Oriented to name only. confabulates Skin: Skin is warm and dry.  Psychiatric:  General-Confused, alert, fairly cooperative. Restless   Medical Problem List and Plan: 1. Functional deficits secondary to TBI 2. DVT Prophylaxis/Anticoagulation: Pharmaceutical: Lovenox 3. Pain Management: Will continue oxycodone prn. Will likely need premedication as unable to express needs. Will monitor for now.  4. Mood: Unable to gauge at this time due to cognitive deficits. LCSW to  follow along for evaluation and support once more appropriate.  5. Neuropsych: This patient is not capable of making decisions on her own behalf. -continue in vail for safety 6.  Hypertension-  BP up this am; will continue present treatment and continue to monitor 7. Fluids/Electrolytes/Nutrition: increase ritalin. Added megace---looks like PO is picking up 8. MSSA HCAP: Completed 10 day course of antibiotic regimen 01/17.    9. Agitation: Decreased Seroquel   -remains substantially impaired from a cognitive standpoint - continue Klonopin 0.25 Mg tid.  - librium will schedule for etoh exacerbated agitation  -sleep cycle improved -Continue vail bed for fall prevention and due to poor insight/awareness-vail soon 10 Activation:  ritalin to help with activation and attention-stop 11. ?GERD/odynophagia--added bid protonix to help with symptoms---vague at best. Having problems swallowing liquids?   LOS (Days) 26 A FACE TO FACE EVALUATION WAS PERFORMED  Rogelia Boga 04/04/2014 9:45 AM

## 2014-04-04 NOTE — Progress Notes (Addendum)
1740 Pt. Had an assisted fall while getting out of vail bed to bathroom.  No injury to head.  VSS and MD notified. Patient denies injury. RN/Sitter continuing to monitor patient for acute changes.

## 2014-04-05 ENCOUNTER — Inpatient Hospital Stay (HOSPITAL_COMMUNITY): Payer: Medicaid Other | Admitting: Physical Therapy

## 2014-04-05 ENCOUNTER — Inpatient Hospital Stay (HOSPITAL_COMMUNITY): Payer: Self-pay

## 2014-04-05 NOTE — Progress Notes (Signed)
Patient ID: Katrine CohoJeffrey L Speyer, male   DOB: 1955/05/23, 59 y.o.   MRN: [id number]  Patient ID: Katrine CohoJeffrey L Brents, male   DOB: 1955/05/23, 59 y.o.   MRN: [id number]   Patient ID: Katrine CohoJeffrey L Mell, male   DOB: 1955/05/23, 59 y.o.   MRN: [id number]   [place] PHYSICAL MEDICINE & REHABILITATION     PROGRESS NOTE  04/05/13.  59 y/o admit for CIR with functional deficits secondary to TBI    Subjective/Complaints: Remains agitated and confused.  Remains in Enclosure bed. Afebrile. No new issues although BP trending down ROS limited due to cognition     Patient Vitals for the past 24 hrs:  BP Temp Temp src Pulse Resp SpO2 Weight  04/05/14 0740 99/62 mmHg 98.7 F (37.1 C) Oral (!) 56 16 97 % -  04/05/14 0500 - - - - - - 188 lb (85.276 kg)  04/05/14 0340 109/87 mmHg 98.2 F (36.8 C) Oral 81 17 98 % -  04/04/14 2340 93/67 mmHg 98.5 F (36.9 C) Oral 84 18 96 % -  04/04/14 2140 103/62 mmHg 98.5 F (36.9 C) Oral 70 18 97 % -  04/04/14 1940 115/68 mmHg 97.6 F (36.4 C) Oral 75 16 95 % -  04/04/14 1740 125/74 mmHg 98.3 F (36.8 C) Oral 80 18 96 % -    No intake or output data in the 24 hours ending 04/05/14 1027    Objective: Vital Signs: Blood pressure 99/62, pulse 56, temperature 98.7 F (37.1 C), temperature source Oral, resp. rate 16, weight 188 lb (85.276 kg), SpO2 97 %. No results found. No results for input(s): WBC, HGB, HCT, PLT in the last 72 hours. No results for input(s): NA, K, CL, GLUCOSE, BUN, CREATININE, CALCIUM in the last 72 hours.  Invalid input(s): CO    Wt Readings from Last 3 Encounters:  04/05/14 188 lb (85.276 kg)  03/09/14 185 lb 9.6 oz (84.188 kg)  01/23/14 210 lb (95.255 kg)    Physical Exam:  Constitutional: He appears well-developed and well-nourished. He is sleeping. He is easily aroused.  HENT:  Head: Normocephalic.  Neck: supple Cardiovascular: Normal rate and regular rhythm.  Respiratory: Effort normal. No respiratory distress.  GI: Soft.  Bowel sounds are normal. He exhibits no distension. There is no tenderness.  Musculoskeletal: He exhibits no edema.  Neurological: He is alert. Good phonation  restless.   Moves all extremities without difficulty. Left side less spontaneous with movement than right but he still moves all 4's. Senses pain in all 4. Oriented to name only. confabulates Skin: Skin is warm and dry.  Psychiatric:  General-Confused, alert, fairly cooperative. Restless   Medical Problem List and Plan: 1. Functional deficits secondary to TBI 2. DVT Prophylaxis/Anticoagulation: Pharmaceutical: Lovenox 3. Pain Management: Will continue oxycodone prn. Will likely need premedication as unable to express needs. Will monitor for now.  4. Mood: Unable to gauge at this time due to cognitive deficits. LCSW to follow along for evaluation and support once more appropriate.  5. Neuropsych: This patient is not capable of making decisions on her own behalf. -continue in vail for safety 6.  Hypertension-  BP trending down- may need dose reduction of catapres 7. Fluids/Electrolytes/Nutrition: increase ritalin. Added megace---looks like PO is picking up 8. MSSA HCAP: Completed 10 day course of antibiotic regimen 01/17.    9. Agitation: Decreased Seroquel   -remains substantially impaired from a cognitive standpoint - continue Klonopin 0.25 Mg tid.  - librium will schedule for  etoh exacerbated agitation  -sleep cycle improved -Continue vail bed for fall prevention and due to poor insight/awareness-vail soon 10 Activation:  ritalin to help with activation and attention-stop    LOS (Days) 27 A FACE TO FACE EVALUATION WAS PERFORMED  Rogelia Boga 04/05/2014 10:27 AM

## 2014-04-05 NOTE — Progress Notes (Signed)
Physical Therapy Note  Patient Details  Name: Justin Pugh MRN: [id number] Date of Birth: February 01, 1956 Today's Date: 04/05/2014    Attempted to see pt for treatment on this date. At scheduled therapy time pt asleep in enclosure bed, easily awakened but demonstrating significant fatigue and lethargy. Therapist unzipped veil bed and offered pt to sit EOB and engage in card game. Pt continued laying down with eyes closed. Pt asked if he wanted to get up and play cards or remain in bed to sleep, pt chose to stay in bed and sleep. Pt left supine in enclosure bed with veil closed and all needs within reach. RN informed of pt's status.   [name], [name] 04/05/2014, 1:29 PM

## 2014-04-05 NOTE — Progress Notes (Signed)
Occupational Therapy Session Note  Patient Details  Name: Justin Pugh MRN: [id number] Date of Birth: [birth year]  Today's Date: 04/05/2014 OT Individual Time:  -  1115-1145  (45 min)      Short Term Goals: Week 1:  OT Short Term Goal 1 (Week 1): Pt will demonstrate sustained attention to self-care task for 1 min with mod cues OT Short Term Goal 1 - Progress (Week 1): Met OT Short Term Goal 2 (Week 1): Pt will consistently be oriented to x3 with max cues OT Short Term Goal 2 - Progress (Week 1): Not met OT Short Term Goal 3 (Week 1): Pt will complete self-care task in standing for 1 min with min assist for balance OT Short Term Goal 3 - Progress (Week 1): Met OT Short Term Goal 4 (Week 1): Pt will complete bathing task with min assist and mod cues for sequencing and attention OT Short Term Goal 4 - Progress (Week 1): Progressing toward goal Week 2:  OT Short Term Goal 1 (Week 2): Pt will wash 10/10 body parts at supervision level with max multimodal cues OT Short Term Goal 1 - Progress (Week 2): Met OT Short Term Goal 2 (Week 2): Pt will be oriented x2 with max cues  OT Short Term Goal 2 - Progress (Week 2): Not met OT Short Term Goal 3 (Week 2): Pt will demonstrate sustained attention to functional task for 1 min with max cues  OT Short Term Goal 3 - Progress (Week 2): Other (comment) (pt had met goal however due to agitation over past few days, pt has not met goal) OT Short Term Goal 4 (Week 2): Pt will identify 1 physical or cognitive deficit with max cues OT Short Term Goal 4 - Progress (Week 2): Not met Week 3:  OT Short Term Goal 1 (Week 3): Pt will identify 1 physical or cognitive deficit with max cues OT Short Term Goal 1 - Progress (Week 3): Not met OT Short Term Goal 2 (Week 3): Pt will follow behavioral management plan for out of room schedule with therapy 100% of time with max cues OT Short Term Goal 2 - Progress (Week 3): Partly met OT Short Term Goal 3 (Week 3): Pt will  be oriented x2 with max cues  OT Short Term Goal 3 - Progress (Week 3): Met OT Short Term Goal 4 (Week 3): Pt will initiate 1 self-care task with min cues OT Short Term Goal 4 - Progress (Week 3): Met  Skilled Therapeutic Interventions/Progress Updates:    Pt. Lying in vail bed upon OT arrival.  Session focused on cognitive reeducation.  Pt. Lying in veil bed asleep.  Easily aroused and agreed to go to ADL apt. Ambulated with SBA.  Rehab tech present.   Instructed pt to make a pot of coffee.  He filled the coffee pot with water and poured in the coffee maker. He  Went to put coffee pot into refrigerator and grabed some cheese and ate.   Pt started using profanity and went to apt.bed and laid down.   Provided 5 minutes of calm, but pt does not relax except for 15-30 seconds before he brings up a subject that causes him to escalate.  In calm voice, OT suggests to pt that we go to his bed /room and he can nap.  Ambulate back to his room.  He sits in chair, turns on tv.  OT suggest playing cards and pt refuses.  He gets up and  returns to bed.   Left pt in enclosed ,zipped bed. Pt. Asked in calm voice after getting in bed for chocolate shake.  Praised him for the polite,calm voice he used to ask for the drink.   Pt. Had not response where in past he usually burst out with loud, profanity comments.  Informed Nurse of pt eating cheese.   She reported pt had not been compliant with diet.  Therapy Documentation Precautions:  Precautions Precautions: Fall Precaution Comments: Elopement risk, agitation at times Restrictions Weight Bearing Restrictions: No    Vital Signs: Therapy Vitals Temp:  (patient refused was agitated) Pain:  None indicated    See FIM for current functional status  Therapy/Group: Individual Therapy  Lisa Roca 04/05/2014, 12:19 PM

## 2014-04-05 NOTE — Progress Notes (Signed)
Occupational Therapy Session Note  Patient Details  Name: Justin CohoJeffrey L Furukawa MRN: [id number] Date of Birth: Sep 11, 1955 Skilled Therapeutic Interventions/Progress Updates:    Pt. Missed 15 minutes of OT treatment due to agitation and unable to redirect.     Humberto Sealsdwards, Walta [place] J 04/05/2014, 12:46 PM

## 2014-04-06 ENCOUNTER — Inpatient Hospital Stay (HOSPITAL_COMMUNITY): Payer: Medicaid Other | Admitting: Certified Registered Nurse Anesthetist

## 2014-04-06 ENCOUNTER — Inpatient Hospital Stay (HOSPITAL_COMMUNITY): Payer: Self-pay | Admitting: Speech Pathology

## 2014-04-06 ENCOUNTER — Inpatient Hospital Stay (HOSPITAL_COMMUNITY): Payer: Medicaid Other | Admitting: *Deleted

## 2014-04-06 ENCOUNTER — Encounter (HOSPITAL_COMMUNITY)
Admission: AD | Disposition: A | Discharge status: Skilled Nursing Facility | Payer: Self-pay | Source: Intra-hospital | Attending: Physical Medicine & Rehabilitation

## 2014-04-06 ENCOUNTER — Inpatient Hospital Stay (HOSPITAL_COMMUNITY): Payer: Self-pay | Admitting: Occupational Therapy

## 2014-04-06 ENCOUNTER — Encounter (HOSPITAL_COMMUNITY): Payer: Self-pay | Admitting: *Deleted

## 2014-04-06 DIAGNOSIS — R1314 Dysphagia, pharyngoesophageal phase: Secondary | ICD-10-CM

## 2014-04-06 DIAGNOSIS — S065X3S Traumatic subdural hemorrhage with loss of consciousness of 1 hour to 5 hours 59 minutes, sequela: Secondary | ICD-10-CM

## 2014-04-06 HISTORY — PX: ESOPHAGOGASTRODUODENOSCOPY: SHX5428

## 2014-04-06 HISTORY — PX: PEG PLACEMENT: SHX5437

## 2014-04-06 LAB — COMPREHENSIVE METABOLIC PANEL
ALK PHOS: 57 U/L (ref 39–117)
ALT: 10 U/L (ref 0–53)
ANION GAP: 7 (ref 5–15)
AST: 14 U/L (ref 0–37)
Albumin: 2.6 g/dL — ABNORMAL LOW (ref 3.5–5.2)
BILIRUBIN TOTAL: 0.3 mg/dL (ref 0.3–1.2)
BUN: 6 mg/dL (ref 6–23)
CO2: 28 mmol/L (ref 19–32)
Calcium: 9.2 mg/dL (ref 8.4–10.5)
Chloride: 103 mmol/L (ref 96–112)
Creatinine, Ser: 0.69 mg/dL (ref 0.50–1.35)
GFR calc Af Amer: >90 mL/min (ref >90)
GLUCOSE: 89 mg/dL (ref 70–99)
POTASSIUM: 4.2 mmol/L (ref 3.5–5.1)
Sodium: 138 mmol/L (ref 135–145)
Total Protein: 6.9 g/dL (ref 6.0–8.3)

## 2014-04-06 LAB — CBC WITH DIFFERENTIAL/PLATELET
Basophils Absolute: 0 10*3/uL (ref 0.0–0.1)
Basophils Relative: 0 % (ref 0–1)
Eosinophils Absolute: 0.4 10*3/uL (ref 0.0–0.7)
Eosinophils Relative: 3 % (ref 0–5)
HCT: 39.4 % (ref 39.0–52.0)
Hemoglobin: 13.2 g/dL (ref 13.0–17.0)
Lymphocytes Relative: 16 % (ref 12–46)
Lymphs Abs: 2.1 10*3/uL (ref 0.7–4.0)
MCH: 31.3 pg (ref 26.0–34.0)
MCHC: 33.5 g/dL (ref 30.0–36.0)
MCV: 93.4 fL (ref 78.0–100.0)
MONOS PCT: 8 % (ref 3–12)
Monocytes Absolute: 1 10*3/uL (ref 0.1–1.0)
Neutro Abs: 9.2 10*3/uL — ABNORMAL HIGH (ref 1.7–7.7)
Neutrophils Relative %: 73 % (ref 43–77)
PLATELETS: 303 10*3/uL (ref 150–400)
RBC: 4.22 MIL/uL (ref 4.22–5.81)
RDW: 12.6 % (ref 11.5–15.5)
WBC: 12.6 10*3/uL — AB (ref 4.0–10.5)

## 2014-04-06 LAB — VALPROIC ACID LEVEL: Valproic Acid Lvl: 44.3 ug/mL — ABNORMAL LOW (ref 50.0–100.0)

## 2014-04-06 SURGERY — EGD (ESOPHAGOGASTRODUODENOSCOPY)
Anesthesia: Monitor Anesthesia Care

## 2014-04-06 MED ORDER — PROPOFOL 10 MG/ML IV BOLUS
INTRAVENOUS | Status: DC | PRN
  Administered 2014-04-06: 50 mg via INTRAVENOUS

## 2014-04-06 MED ORDER — LIDOCAINE HCL (CARDIAC) 20 MG/ML IV SOLN
INTRAVENOUS | Status: DC | PRN
  Administered 2014-04-06: 50 mg via INTRAVENOUS

## 2014-04-06 MED ORDER — LACTATED RINGERS IV SOLN
INTRAVENOUS | Status: DC
Start: 2014-04-06 — End: 2014-04-09
  Administered 2014-04-06: 1000 mL via INTRAVENOUS

## 2014-04-06 MED ORDER — PROPOFOL INFUSION 10 MG/ML OPTIME
INTRAVENOUS | Status: DC | PRN
  Administered 2014-04-06: 120 ug/kg/min via INTRAVENOUS

## 2014-04-06 MED ORDER — MIDAZOLAM HCL 5 MG/5ML IJ SOLN
INTRAMUSCULAR | Status: DC | PRN
  Administered 2014-04-06: 2 mg via INTRAVENOUS

## 2014-04-06 NOTE — Anesthesia Postprocedure Evaluation (Signed)
Anesthesia Post Note  Patient: Justin Pugh  Procedure(s) Performed: Procedure(s) (LRB): ESOPHAGOGASTRODUODENOSCOPY (EGD) (N/A) PERCUTANEOUS ENDOSCOPIC GASTROSTOMY (PEG) PLACEMENT (N/A)  Anesthesia type: general  Patient location: PACU  Post pain: Pain level controlled  Post assessment: Patient's Cardiovascular Status Stable  Last Vitals:  Filed Vitals:   04/06/14 1212  BP: 140/79  Pulse: 71  Temp:   Resp: 21    Post vital signs: Reviewed and stable  Level of consciousness: sedated  Complications: No apparent anesthesia complications

## 2014-04-06 NOTE — Interval H&P Note (Signed)
History and Physical Interval Note: Patient is at fairly high risk fro dislodging his PEG.  Will need to be carefully watched.  Discussed with Dr. Hermelinda MedicusSchwartz. 04/06/2014 8:57 AM  Katrine CohoJeffrey L Ditton  has presented today for surgery, with the diagnosis of dysphagia  The various methods of treatment have been discussed with the patient and family. After consideration of risks, benefits and other options for treatment, the patient has consented to  Procedure(s): ESOPHAGOGASTRODUODENOSCOPY (EGD) (N/A) PERCUTANEOUS ENDOSCOPIC GASTROSTOMY (PEG) PLACEMENT (N/A) as a surgical intervention .  The patient's history has been reviewed, patient examined, no change in status, stable for surgery.  I have reviewed the patient's chart and labs.  Questions were answered to the patient's satisfaction.     Mitzie Marlar, JAY

## 2014-04-06 NOTE — Op Note (Signed)
OPERATIVE REPORT  DATE OF OPERATION: 03/09/2014 - 04/06/2014  PATIENT:  Justin Pugh  59 y.o. male  PRE-OPERATIVE DIAGNOSIS:  dysphagia  POST-OPERATIVE DIAGNOSIS:  PEG placement  PROCEDURE:  Procedure(s): ESOPHAGOGASTRODUODENOSCOPY (EGD) PERCUTANEOUS ENDOSCOPIC GASTROSTOMY (PEG) PLACEMENT  SURGEON:  Surgeon(s): Frederik SchmidtJay Bobbi Yount, MD  ASSISTANLeotis Shames: Jeffery, PA-C  ANESTHESIA:   local, IV sedation and MAC  EBL: <10 ml  BLOOD ADMINISTERED: none  DRAINS: PEG  SPECIMEN:  No Specimen  COUNTS CORRECT:  YES  PROCEDURE DETAILS: This procedure was performed in the endoscopy suite. IV sedation and local anesthetic was used. A CRNA was in attendance.  A proper timeout was performed identifying the patient and the procedure to be performed. A Pentax 2990 endoscope was used to cannulate the patient's oropharynx and upper esophagus with minimal difficulty. This is after he had been given intravenous sedation. The upper esophagus appeared to be normal as well as the body of the stomach. An attempt was made to cannulate the pylorus but we were unsuccessful although normal biliary secretions were noted from the pylorus.  We brought the tip of the scope back into the body of the stomach where the impulse from the assistant's finger on the anterior abdominal wall was noted. It was at that site and an incision was made externally in order to pass an angiocatheter directly into the stomach under direct vision. It was through the angiocatheter that a looped blue wire was passed and subsequently retrieved by a snare passed through the endoscope. We pulled the balloon looped blue wire through the patient's mouth and subsequently attached it to the pull-through gastrostomy tube. This was then pulled out through the anterior abdominal wall to its proper position on the anterior abdominal wall stopping at the bolster of the gastrostomy tube.  The endoscope was reinserted into the stomach where pictures are taken of  the bolster of the gastrostomy tube in the proper position. There was minimal bleeding. We aspirated most of the fluid and gas. We retrieved the endoscope with minimal difficulty. All counts were correct.  We used a 2-0 nylon suture to help secure the gastrostomy tube in place because of this patient's high risk of dislodgment.  PATIENT DISPOSITION:  PACU - hemodynamically stable.   [name], JAY 2/15/201611:38 AM

## 2014-04-06 NOTE — Progress Notes (Signed)
Physical Therapy Note  Patient Details  Name: Justin Pugh MRN: [id number] Date of Birth: 1955-07-03 Today's Date: 04/06/2014  Patient missed 45 minutes of skilled physical therapy this AM secondary to preparation for procedure. RN requesting no therapy at this time. Will follow up as able.   Zella RicherBridget S Pugh Dhondt S. Hatim Homann, PT, DPT 04/06/2014, 9:00 AM

## 2014-04-06 NOTE — Progress Notes (Signed)
SLP Cancellation Note  Patient Details Name: Justin CohoJeffrey L Shillingburg MRN: [id number] DOB: 1955-09-05   Cancelled treatment:       Patient missed 30 minutes of skilled SLP intervention due to off unit for PEG placement.                                                                                                Tarrance Januszewski 04/06/2014, 11:07 AM

## 2014-04-06 NOTE — Progress Notes (Signed)
Patient has rested quietly since 0010. NPO since 2330. Justin Pugh, Justin Pugh

## 2014-04-06 NOTE — Progress Notes (Signed)
Occupational Therapy Note  Patient Details  Name: Katrine CohoJeffrey L Trussell MRN: [id number] Date of Birth: Jun 07, 1955  Today's Date: 04/06/2014 OT Individual Time:  -      Pt missed 30 min of OT due to being out of the unit for peg placement. Team aware.   Roney MansSmith, Lyrika Souders [facility] Paynsey 04/06/2014, 3:44 PM

## 2014-04-06 NOTE — Anesthesia Preprocedure Evaluation (Addendum)
Anesthesia Evaluation  Patient identified by MRN, date of birth, ID band Patient awake    Reviewed: Allergy & Precautions, NPO status , Patient's Chart, lab work & pertinent test results  Airway Mallampati: I  TM Distance: >3 FB Neck ROM: Full    Dental   Pulmonary Current Smoker,  Aspirating actively         Cardiovascular     Neuro/Psych S/P traumatic brain injury    GI/Hepatic GERD-  Medicated and Controlled,  Endo/Other    Renal/GU      Musculoskeletal   Abdominal   Peds  Hematology   Anesthesia Other Findings   Reproductive/Obstetrics                            Anesthesia Physical Anesthesia Plan  ASA: III  Anesthesia Plan: MAC   Post-op Pain Management:    Induction: Intravenous  Airway Management Planned: Natural Airway  Additional Equipment:   Intra-op Plan:   Post-operative Plan:   Informed Consent: I have reviewed the patients History and Physical, chart, labs and discussed the procedure including the risks, benefits and alternatives for the proposed anesthesia with the patient or authorized representative who has indicated his/her understanding and acceptance.     Plan Discussed with: CRNA and Surgeon  Anesthesia Plan Comments:         Anesthesia Quick Evaluation

## 2014-04-06 NOTE — Progress Notes (Signed)
[facility]     PROGRESS NOTE    Subjective/Complaints: Fairly uneventful night. More sedated over weekend. ROS limited due to cognition  Objective: Vital Signs: Blood pressure 110/69, pulse 76, temperature 97.5 F (36.4 C), temperature source Oral, resp. rate 18, weight 85.276 kg (188 lb), SpO2 98 %. No results found. No results for input(s): WBC, HGB, HCT, PLT in the last 72 hours. No results for input(s): NA, K, CL, GLUCOSE, BUN, CREATININE, CALCIUM in the last 72 hours.  Invalid input(s): CO CBG (last 3)  No results for input(s): GLUCAP in the last 72 hours.  Wt Readings from Last 3 Encounters:  04/05/14 85.276 kg (188 lb)  03/09/14 84.188 kg (185 lb 9.6 oz)  01/23/14 95.255 kg (210 lb)    Physical Exam:  Constitutional: He appears well-developed and well-nourished. He is sleeping. He is easily aroused.  HENT:  Head: Normocephalic.  Neck: supple Cardiovascular: Normal rate and regular rhythm.  Respiratory: Effort normal. No respiratory distress.  GI: Soft. Bowel sounds are normal. He exhibits no distension. There is no tenderness.  Musculoskeletal: He exhibits no edema.  Neurological: He is alert. Good phonation. A little hoarse  restless.   Moves all extremities without difficulty. equally moves all 4's. Senses pain in all 4. Oriented to name only. confabulates Skin: Skin is warm and dry.  Psychiatric:  Confused   Assessment/Plan: 1. 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 Physiatrist is providing close team supervision and 24 hour management of active medical problems listed below. Physiatrist and rehab team continue to assess barriers to discharge/monitor patient progress toward functional and medical goals.  Have reduced therapies to QD given plateau, patient cooperation.   FIM: FIM - Bathing Bathing Steps Patient  Completed: Chest, Abdomen, Front perineal area, Right Arm, Left Arm, Buttocks, Right upper leg, Left upper leg, Left lower leg (including foot), Right lower leg (including foot) Bathing: 5: Supervision: Safety issues/verbal cues  FIM - Upper Body Dressing/Undressing Upper body dressing/undressing steps patient completed: Thread/unthread right sleeve of pullover shirt/dresss, Thread/unthread left sleeve of pullover shirt/dress, Put head through opening of pull over shirt/dress, Pull shirt over trunk Upper body dressing/undressing: 5: Supervision: Safety issues/verbal cues FIM - Lower Body Dressing/Undressing Lower body dressing/undressing steps patient completed: Thread/unthread right underwear leg, Thread/unthread left underwear leg, Pull underwear up/down, Thread/unthread right pants leg, Thread/unthread left pants leg, Pull pants up/down, Don/Doff right sock, Don/Doff left sock, Don/Doff right shoe, Fasten/unfasten left shoe, Fasten/unfasten right shoe, Don/Doff left shoe Lower body dressing/undressing: 5: Set-up assist to: Obtain clothing  FIM - Toileting Toileting steps completed by patient: Adjust clothing prior to toileting, Performs perineal hygiene, Adjust clothing after toileting Toileting Assistive Devices: Grab bar or rail for support Toileting: 5: Supervision: Safety issues/verbal cues  FIM - Diplomatic Services operational officer Devices: Grab bars Toilet Transfers: 5-To toilet/BSC: Supervision (verbal cues/safety issues), 5-From toilet/BSC: Supervision (verbal cues/safety issues)  FIM - Banker Devices: Arm rests Bed/Chair Transfer: 0: Activity did not occur  FIM - Locomotion: Wheelchair Locomotion: Wheelchair: 0: Activity did not occur FIM - Locomotion: Ambulation Locomotion: Ambulation Assistive Devices: Other (comment) (none) Ambulation/Gait Assistance: Not tested (comment) Locomotion: Ambulation: 0: Activity did not  occur  Comprehension Comprehension Mode: Auditory Comprehension: 5-Follows basic conversation/direction: With extra time/assistive device  Expression Expression Mode: Verbal Expression: 3-Expresses basic 50 - 74% of the time/requires cueing 25 - 50% of the time.  Needs to repeat parts of sentences.  Social Interaction Social Interaction: 2-Interacts appropriately 25 - 49% of time - Needs frequent redirection.  Problem Solving Problem Solving: 1-Solves basic less than 25% of the time - needs direction nearly all the time or does not effectively solve problems and may need a restraint for safety  Memory Memory: 1-Recognizes or recalls less than 25% of the time/requires cueing greater than 75% of the time  Medical Problem List and Plan: 1. Functional deficits secondary to TBI  2. DVT Prophylaxis/Anticoagulation: Pharmaceutical: Lovenox 3. Pain Management: Will continue oxycodone prn. Will likely need premedication as unable to express needs. Will monitor for now.  4. Mood: Unable to gauge at this time due to cognitive deficits. LCSW to follow along for evaluation and support once more appropriate.  5. Neuropsych: This patient is not capable of making decisions on her own behalf. -continue in vail for safety 6. Skin/Wound Care: Pressure relief measures. Maintain adequate hydration and nutritional status as able.  7. Fluids/Electrolytes/Nutrition:megace stoppped 8. Recurrent  HCAP: Completed 10 day course of antibiotic regimen 01/17 for MSSA. now on empiric Levaquin 9. Reactive Leucocytosis: Resolving.  10. Hypokalemia: Likely due to poor nutritional status as well as dilutional .    11. Agitation: still confused,inappropriate,easily agitated  -seroquel  -continue Klonopin reduced to 0.25mg  tid--dc entirely - librium 25mg  tid  -continue depakote for further behavior control -sleep cycle overall better but still not  consistent -Continue vail bed for fall prevention and due to poor insight/awareness---not ready to come out of yet  -appreciate psych assessment and input---[facility]  ref[facility] ----transfer potentially when medically stable 13. ?GERD/odynophagia/dysphagia  -PEG being placed today-spoke to surgery  -may need wrist restraints to keep from dislodging initially 14. RML pneumonia---levaquin--dc'ed   LOS (Days) 28 A FACE TO FACE EVALUATION WAS PERFORMED  Watson Robarge T 04/06/2014 9:27 AM

## 2014-04-06 NOTE — H&P (View-Only) (Signed)
Was called by Pamela Love, PA-C, regarding permanent enteral access for this patient. He is apparently aspirating and, while he did make some progress, he regressed and needs some other way to get enteral feeds. The rehab team has had long talks with mom and the plan they've come up with is to place a PEG with the hope that he'll continue to improve. She understands that given his agitation it's very possible that he'll pull this out and need a laparotomy with open tube placement. The team and she feel that's a better risk than trying to feed him. Endoscopy is closed now so we will plan to set something up Monday morning for later that day or Tuesday. We'll likely need anesthesia help in endo for the procedure.    Frimy Uffelman J. Geneen Dieter, PA-C Pager: [phone] General Trauma PA Pager: [phone] 

## 2014-04-06 NOTE — Transfer of Care (Signed)
Immediate Anesthesia Transfer of Care Note  Patient: Justin Pugh  Procedure(s) Performed: Procedure(s): ESOPHAGOGASTRODUODENOSCOPY (EGD) (N/A) PERCUTANEOUS ENDOSCOPIC GASTROSTOMY (PEG) PLACEMENT (N/A)  Patient Location: PACU and Endoscopy Unit  Anesthesia Type:mac  Level of Consciousness: lethargic  Airway & Oxygen Therapy: Patient Spontanous Breathing and Patient connected to face mask oxygen  Post-op Assessment: Report given to RN and Post -op Vital signs reviewed and stable  Post vital signs: Reviewed and stable  Last Vitals:  Filed Vitals:   04/06/14 1033  BP: 120/80  Pulse:   Temp: 36.7 C  Resp: 11    Complications: No apparent anesthesia complications

## 2014-04-07 ENCOUNTER — Inpatient Hospital Stay (HOSPITAL_COMMUNITY): Payer: Medicaid Other | Admitting: *Deleted

## 2014-04-07 ENCOUNTER — Encounter (HOSPITAL_COMMUNITY): Payer: Self-pay | Admitting: General Surgery

## 2014-04-07 ENCOUNTER — Encounter (HOSPITAL_COMMUNITY): Payer: Self-pay

## 2014-04-07 ENCOUNTER — Inpatient Hospital Stay (HOSPITAL_COMMUNITY): Payer: Medicaid Other | Admitting: Speech Pathology

## 2014-04-07 LAB — GLUCOSE, CAPILLARY: Glucose-Capillary: 81 mg/dL (ref 70–99)

## 2014-04-07 MED ORDER — ALUM & MAG HYDROXIDE-SIMETH 200-200-20 MG/5ML PO SUSP: 30.0000 mL | ORAL | Status: DC | PRN

## 2014-04-07 MED ORDER — JEVITY 1.2 CAL PO LIQD
50.0000 mL | Freq: Three times a day (TID) | ORAL | Status: DC
  Administered 2014-04-07: 50 mL
  Filled 2014-04-07 (×5): qty 237

## 2014-04-07 MED ORDER — PANTOPRAZOLE SODIUM 40 MG PO PACK
40.0000 mg | PACK | Freq: Two times a day (BID) | ORAL | Status: DC
  Administered 2014-04-07 – 2014-04-28 (×42): 40 mg
  Filled 2014-04-07 (×46): qty 20

## 2014-04-07 MED ORDER — SUCRALFATE 1 GM/10ML PO SUSP
1.0000 g | Freq: Three times a day (TID) | ORAL | Status: DC
  Administered 2014-04-07 – 2014-05-22 (×172): 1 g
  Filled 2014-04-07 (×187): qty 10

## 2014-04-07 MED ORDER — JEVITY 1.2 CAL PO LIQD
425.0000 mL | Freq: Four times a day (QID) | ORAL | Status: DC
  Administered 2014-04-07 – 2014-04-17 (×38): 425 mL
  Administered 2014-04-17: 250 mL
  Administered 2014-04-18 – 2014-04-23 (×22): 425 mL
  Filled 2014-04-07 (×88): qty 474

## 2014-04-07 MED ORDER — FOLIC ACID 1 MG PO TABS
1.0000 mg | ORAL_TABLET | Freq: Every day | ORAL | Status: DC
  Administered 2014-04-08 – 2014-04-20 (×13): 1 mg
  Filled 2014-04-07 (×18): qty 1

## 2014-04-07 MED ORDER — CHLORDIAZEPOXIDE HCL 25 MG PO CAPS
25.0000 mg | ORAL_CAPSULE | Freq: Three times a day (TID) | ORAL | Status: DC
  Administered 2014-04-07 – 2014-04-08 (×2): 25 mg
  Filled 2014-04-07 (×2): qty 1

## 2014-04-07 MED ORDER — TRAMADOL HCL 50 MG PO TABS
50.0000 mg | ORAL_TABLET | Freq: Four times a day (QID) | ORAL | Status: DC | PRN
Start: 2014-04-07 — End: 2014-05-22
  Administered 2014-04-12: 100 mg
  Administered 2014-04-13: 50 mg
  Administered 2014-04-16: 100 mg
  Administered 2014-04-16 – 2014-04-17 (×2): 50 mg
  Administered 2014-04-21 – 2014-05-06 (×5): 100 mg
  Administered 2014-05-08 – 2014-05-15 (×4): 50 mg
  Filled 2014-04-07: qty 2
  Filled 2014-04-07: qty 1
  Filled 2014-04-07 (×2): qty 2
  Filled 2014-04-07: qty 1
  Filled 2014-04-07: qty 2
  Filled 2014-04-07: qty 1
  Filled 2014-04-07 (×2): qty 2
  Filled 2014-04-07 (×3): qty 1
  Filled 2014-04-07: qty 2
  Filled 2014-04-07 (×2): qty 1

## 2014-04-07 MED ORDER — OXYCODONE HCL 5 MG PO TABS
5.0000 mg | ORAL_TABLET | ORAL | Status: DC | PRN
  Administered 2014-04-07 – 2014-04-08 (×4): 10 mg
  Filled 2014-04-07 (×3): qty 2

## 2014-04-07 MED ORDER — VALPROIC ACID 250 MG/5ML PO SYRP
500.0000 mg | ORAL_SOLUTION | Freq: Two times a day (BID) | ORAL | Status: DC
  Filled 2014-04-07 (×2): qty 10

## 2014-04-07 MED ORDER — PRO-STAT SUGAR FREE PO LIQD
30.0000 mL | Freq: Every day | ORAL | Status: DC
  Administered 2014-04-07 – 2014-04-26 (×20): 30 mL
  Filled 2014-04-07 (×24): qty 30

## 2014-04-07 MED ORDER — VALPROIC ACID 250 MG/5ML PO SYRP
750.0000 mg | ORAL_SOLUTION | Freq: Two times a day (BID) | ORAL | Status: DC
  Administered 2014-04-07 – 2014-04-20 (×27): 750 mg
  Filled 2014-04-07 (×36): qty 15

## 2014-04-07 MED ORDER — QUETIAPINE FUMARATE 50 MG PO TABS
50.0000 mg | ORAL_TABLET | Freq: Four times a day (QID) | ORAL | Status: DC | PRN
  Administered 2014-04-25 (×2): 50 mg
  Filled 2014-04-07 (×4): qty 1

## 2014-04-07 MED ORDER — QUETIAPINE FUMARATE 200 MG PO TABS
200.0000 mg | ORAL_TABLET | Freq: Two times a day (BID) | ORAL | Status: DC
  Administered 2014-04-07 – 2014-05-22 (×90): 200 mg
  Filled 2014-04-07 (×99): qty 1

## 2014-04-07 MED ORDER — PROPRANOLOL HCL 20 MG PO TABS
20.0000 mg | ORAL_TABLET | Freq: Three times a day (TID) | ORAL | Status: DC
  Administered 2014-04-07 – 2014-05-01 (×68): 20 mg
  Filled 2014-04-07 (×85): qty 1

## 2014-04-07 MED ORDER — JEVITY 1.2 CAL PO LIQD
1000.0000 mL | ORAL | Status: DC
  Filled 2014-04-07 (×2): qty 1000

## 2014-04-07 MED ORDER — SENNOSIDES 8.8 MG/5ML PO SYRP
10.0000 mL | ORAL_SOLUTION | Freq: Every day | ORAL | Status: DC
  Administered 2014-04-07: 10 mL
  Filled 2014-04-07 (×3): qty 10

## 2014-04-07 MED ORDER — ACETAMINOPHEN 325 MG PO TABS
325.0000 mg | ORAL_TABLET | ORAL | Status: DC | PRN
Start: 2014-04-07 — End: 2014-04-10

## 2014-04-07 MED ORDER — FREE WATER
150.0000 mL | Freq: Four times a day (QID) | Status: DC
  Administered 2014-04-07 – 2014-04-25 (×68): 150 mL

## 2014-04-07 MED ORDER — SORBITOL 70 % SOLN
30.0000 mL | Freq: Every day | Status: DC
  Administered 2014-04-08: 30 mL
  Filled 2014-04-07 (×3): qty 30

## 2014-04-07 NOTE — Progress Notes (Signed)
Social Work Patient ID: Justin Pugh, male   DOB: 05/24/1955, 59 y.o.   MRN: [id number]   [facility].  Informed by "Justin Pugh" that their physician had "Denied" pt's case stating that he was not yet independent in his mobility or ADLs.  I attempted to describe pt's level of mobility and ADLs (supervision - independent), however, she stated that I would need to fax documentation.  I did send therapy notes yesterday.  Follow up with [facility] this morning and they reported that I now need to submit the "entire referral packet" again as the initial referral info had been "thrown away" when MD issued denial.  I also questioned Justin DresserConnie if the fact that pt now has a peg tube would  affect his case for consideration.  She states, "Well, it doesn't help anything but does not automatically mean we would deny him.  Our medical director would just have to make a decision."  Will contact Justin Pugh, SW who created the initial referral packet to assist me in a new packet.  Will also speak with Justin Pugh and Justin [facility]aton [name], LCSW about this case and the current status/ obstacles.  Will keep team posted.  Rieley Khalsa, LCSW

## 2014-04-07 NOTE — Progress Notes (Signed)
Occupational Therapy Note  Patient Details  Name: Justin Pugh MRN: [id number] Date of Birth: 10-25-1955  Today's Date: 04/07/2014 OT Missed Time: 45 Minutes Missed Time Reason: Patient fatigue;Other (comment) (preventing agitation)  Patient missing 45 min skilled occupational therapy due to fatigue and allowing for de-escalation. Patient received supine in bed asleep and initially responding to therapist's greeting. After short time, patient clearly asleep and not verbally responding to therapist. Allowed patient to continue to sleep to allow for de-escalation from previous therapy session.   Lajoya Dombek N 04/07/2014, 10:38 AM

## 2014-04-07 NOTE — Progress Notes (Signed)
[facility]ILIT[facility]     PROGRESS NOTE    Subjective/Complaints: Fairly uneventful night.   ROS limited due to cognition  Objective: Vital Signs: Blood pressure 131/75, pulse 84, temperature 99 F (37.2 C), temperature source Oral, resp. rate 18, weight 83.961 kg (185 lb 1.6 oz), SpO2 100 %. No results found.  Recent Labs  04/06/14 0915  WBC 12.6*  HGB 13.2  HCT 39.4  PLT 303    Recent Labs  04/06/14 0915  NA 138  K 4.2  CL 103  GLUCOSE 89  BUN 6  CREATININE 0.69  CALCIUM 9.2   CBG (last 3)  No results for input(s): GLUCAP in the last 72 hours.  Wt Readings from Last 3 Encounters:  04/07/14 83.961 kg (185 lb 1.6 oz)  03/09/14 84.188 kg (185 lb 9.6 oz)  01/23/14 95.255 kg (210 lb)    Physical Exam:  Constitutional: He appears well-developed and well-nourished. He is sleeping. He is easily aroused.  HENT:  Head: Normocephalic.  Neck: supple Cardiovascular: Normal rate and regular rhythm.  Respiratory: Effort normal. No respiratory distress.  GI: Soft. Bowel sounds are normal. He exhibits no distension. There is no tenderness.  Musculoskeletal: He exhibits no edema.  Neurological: He is alert. Good phonation. A little hoarse  restless.   Moves all extremities without difficulty. equally moves all 4's. Senses pain in all 4. Oriented to name only. confabulates Skin: Skin is warm and dry.  Psychiatric:  Confused   Assessment/Plan: 1. 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 Physiatrist is providing close team supervision and 24 hour management of active medical problems listed below. Physiatrist and rehab team continue to assess barriers to discharge/monitor patient progress toward functional and medical goals.  Have reduced therapies to QD given plateau, patient cooperation.   FIM: FIM - Bathing Bathing Steps Patient  Completed: Chest, Abdomen, Front perineal area, Right Arm, Left Arm, Buttocks, Right upper leg, Left upper leg, Left lower leg (including foot), Right lower leg (including foot) Bathing: 5: Supervision: Safety issues/verbal cues  FIM - Upper Body Dressing/Undressing Upper body dressing/undressing steps patient completed: Thread/unthread right sleeve of pullover shirt/dresss, Thread/unthread left sleeve of pullover shirt/dress, Put head through opening of pull over shirt/dress, Pull shirt over trunk Upper body dressing/undressing: 5: Supervision: Safety issues/verbal cues FIM - Lower Body Dressing/Undressing Lower body dressing/undressing steps patient completed: Thread/unthread right underwear leg, Thread/unthread left underwear leg, Pull underwear up/down, Thread/unthread right pants leg, Thread/unthread left pants leg, Pull pants up/down, Don/Doff right sock, Don/Doff left sock, Don/Doff right shoe, Fasten/unfasten left shoe, Fasten/unfasten right shoe, Don/Doff left shoe Lower body dressing/undressing: 5: Set-up assist to: Obtain clothing  FIM - Toileting Toileting steps completed by patient: Adjust clothing prior to toileting, Performs perineal hygiene, Adjust clothing after toileting Toileting Assistive Devices: Grab bar or rail for support Toileting: 5: Supervision: Safety issues/verbal cues  FIM - Diplomatic Services operational officerToilet Transfers Toilet Transfers Assistive Devices: Grab bars Toilet Transfers: 5-To toilet/BSC: Supervision (verbal cues/safety issues), 5-From toilet/BSC: Supervision (verbal cues/safety issues)  FIM - BankerBed/Chair Transfer Bed/Chair Transfer Assistive Devices: Arm rests Bed/Chair Transfer: 0: Activity did not occur  FIM - Locomotion: Wheelchair Locomotion: Wheelchair: 0: Activity did not occur FIM - Locomotion: Ambulation Locomotion: Ambulation Assistive Devices: Other (comment) (none) Ambulation/Gait Assistance: Not tested (comment) Locomotion: Ambulation: 0: Activity did not  occur  Comprehension Comprehension Mode: Auditory Comprehension: 4-Understands basic 75 - 89% of the time/requires cueing 10 -  24% of the time  Expression Expression Mode: Verbal Expression: 4-Expresses basic 75 - 89% of the time/requires cueing 10 - 24% of the time. Needs helper to occlude trach/needs to repeat words.  Social Interaction Social Interaction Mode: Not assessed Social Interaction: 2-Interacts appropriately 25 - 49% of time - Needs frequent redirection.  Problem Solving Problem Solving: 2-Solves basic 25 - 49% of the time - needs direction more than half the time to initiate, plan or complete simple activities  Memory Memory: 1-Recognizes or recalls less than 25% of the time/requires cueing greater than 75% of the time  Medical Problem List and Plan: 1. Functional deficits secondary to TBI  2. DVT Prophylaxis/Anticoagulation: Pharmaceutical: Lovenox 3. Pain Management: Will continue oxycodone prn. Will likely need premedication as unable to express needs. Will monitor for now.  4. Mood: Unable to gauge at this time due to cognitive deficits. LCSW to follow along for evaluation and support once more appropriate.  5. Neuropsych: This patient is not capable of making decisions on her own behalf. -continue in vail for safety 6. Skin/Wound Care: Pressure relief measures. Maintain adequate hydration and nutritional status as able.  7. Fluids/Electrolytes/Nutrition:megace stoppped 8. Recurrent  HCAP: Completed 10 day course of antibiotic regimen 01/17 for MSSA. now on empiric Levaquin 9. Reactive Leucocytosis: Resolving.  10. Hypokalemia: Likely due to poor nutritional status as well as dilutional .    11. Agitation: still confused,inappropriate,easily agitated  -seroquel  -continue Klonopin reduced to 0.25mg  tid--dc entirely - librium  tid  -continue depakote for further behavior control -sleep cycle  overall better but still not consistent -Continue vail bed for fall prevention and due to poor insight/awareness---not ready to come out of yet  -appreciate psych assessment and input---Central  referral ----transfer potentially when medically stable 13. ?GERD/odynophagia/dysphagia  -PEG placed---tolerated well with mild to moderate abdominal pain.   -may need wrist restraints to keep from dislodging initially---observe closely 14. RML pneumonia---levaquin--dc'ed   LOS (Days) 29 A FACE TO FACE EVALUATION WAS PERFORMED  Hilde Churchman T 04/07/2014 8:30 AM
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
## 2014-04-07 NOTE — Plan of Care (Signed)
Problem: RH Ambulation Goal: LTG Patient will ambulate in controlled environment (PT) LTG: Patient will ambulate in a controlled environment, # of feet with assistance (PT).  Outcome: Completed/Met Date Met:  04/07/14 Goal met, however, ambulation distances limited secondary to patient not allowed outside controlled environment of room due to elopement risk, NPO status (patient attempting to eat snacks from family room or RN station) and safety risk to staff and other patients. Goal: LTG Patient will ambulate in community environment (PT) LTG: Patient will ambulate in community environment, # of feet with assistance (PT).  Outcome: Not Met (add Reason) Ambulation distances limited secondary to patient not allowed outside controlled environment of room due to elopement risk, NPO status (patient attempting to eat snacks from family room or RN station) and safety risk to staff and other patients.  Problem: RH Stairs Goal: LTG Patient will ambulate up and down stairs w/assist (PT) LTG: Patient will ambulate up and down # of stairs with assistance (PT)  Outcome: Not Met (add Reason) Stair negotiation attempts limited secondary to patient not allowed outside controlled environment of room due to elopement risk, NPO status (patient attempting to eat snacks from family room or RN station) and safety risk to staff and other patients.  Problem: RH Memory Goal: LTG Patient demonstrate ability for day to day recall (PT) LTG: Patient will demonstrate ability for day to day recall/carryover during mobility activities with assist (PT)  Outcome: Not Met (add Reason) Not met due to significant cognitive-linguistic deficits and increased agitation with therapeutic tasks; therapy limited secondary to patient not allowed outside controlled environment of room due to elopement risk, NPO status (patient attempting to eat snacks from family room or RN station) and safety risk to staff and other patients.  Problem: RH  Awareness Goal: LTG: Patient will demonstrate intellectual/emergent (PT) LTG: Patient will demonstrate intellectual/emergent/anticipatory awareness with assist during a mobility activity (PT)  Outcome: Not Met (add Reason) Not met due to significant cognitive-linguistic deficits and increased agitation with therapeutic tasks; therapy limited secondary to patient not allowed outside controlled environment of room due to elopement risk, NPO status (patient attempting to eat snacks from family room or RN station) and safety risk to staff and other patients.

## 2014-04-07 NOTE — Progress Notes (Signed)
Physical Therapy Discharge Summary  Patient Details  Name: Justin Pugh MRN: [id number] Date of Birth: 10/26/1955  Today's Date: 04/07/2014  Patient has met 9 of 13 long term goals and will be discharged from therapy services at this time.  Patient to discharge at an ambulatory level [place] I. Patient currently mod I in controlled environment of room for functional mobility,but requires 24/7 supervision of sitter and use of enclosure bed for safety due to elopement risk, safety risk to staff and other patients, and NPO status (with patient attempting to eat snacks from family room). Patient's care partner unavailable to provide the necessary cognitive assistance at discharge. Due to patient's poor progress towards cognitive-linguistic goals, frequent and significant levels of agitation and escalation, inability to de-escalate, safety risks, and behavioral issues outside the scope of PT, patient will be discharged from physical therapy services at this time.  Reasons goals not met: Ambulation distances/environments and stair negotiation attempts limited secondary to patient not allowed outside controlled environment of room due to elopement risk, NPO status (patient attempting to eat snacks from family room or RN station) and safety risk to staff and other patients; Memory, attention, and awareness goals not met due to significant cognitive-linguistic deficits and increased agitation with therapeutic tasks; therapy limited secondary frequent and significant agitation and patient not allowed outside controlled environment of room due to elopement risk, NPO status (patient attempting to eat snacks from family room or RN station) and safety risk to staff and other patients.  Recommendation:  Patient will benefit from ongoing psyc services to manage behavioral issues.   Equipment: No equipment provided  Reasons for discharge: lack of progress toward goals and behavioral management outside  scope of PT  Patient/family agrees with progress made and goals achieved: Patient unable and no family present  PT Discharge Precautions/Restrictions Precautions Precautions: Fall Precaution Comments: Elopement risk, agitation, PEG Restrictions Weight Bearing Restrictions: No Vision/Perception    See OT note. Cognition Overall Cognitive Status: Impaired/Different from baseline Arousal/Alertness: Awake/alert (fluctuates between awake/alert and lethargic) Orientation Level: Oriented to person Attention: Sustained Sustained Attention: Impaired Sustained Attention Impairment: Functional basic;Verbal basic Memory: Impaired Memory Impairment: Decreased recall of new information;Retrieval deficit;Prospective memory;Decreased short term memory Awareness: Impaired Awareness Impairment: Intellectual impairment;Emergent impairment;Anticipatory impairment Problem Solving: Impaired Problem Solving Impairment: Verbal basic;Functional basic Behaviors: Restless;Impulsive;Verbal agitation;Physical agitation;Perseveration;Poor frustration tolerance Safety/Judgment: Impaired [employer] Scales of Cognitive Functioning: Confused/agitated Sensation Sensation Light Touch: Appears Intact Hot/Cold: Appears Intact Proprioception: Appears Intact Coordination Gross Motor Movements are Fluid and Coordinated: Yes Fine Motor Movements are Fluid and Coordinated: Yes Motor  Motor Motor: Within Functional Limits  Mobility Bed Mobility Bed Mobility: Supine to Sit;Sit to Supine Supine to Sit: 6: Modified independent (Device/Increase time);HOB flat Supine to Sit Details (indicate cue type and reason): In controlled environment of room. Sit to Supine: 6: Modified independent (Device/Increase time);HOB flat Sit to Supine - Details (indicate cue type and reason): In controlled environment of room. Transfers Transfers: Yes Sit to Stand: 6: Modified independent (Device/Increase time);With  armrests;Without upper extremity assist;With upper extremity assist;From toilet;From chair/3-in-1;From bed Sit to Stand Details (indicate cue type and reason): In controlled environment of room. Stand to Sit: 6: Modified independent (Device/Increase time);To chair/3-in-1;To bed;To toilet;With armrests;Without upper extremity assist;With upper extremity assist Stand to Sit Details: In controlled environment of room. Stand Pivot Transfers: 6: Modified independent (Device/Increase time) Stand Pivot Transfer Details (indicate cue type and reason): In controlled environment of room. Locomotion  Ambulation Ambulation: Yes Ambulation/Gait Assistance: 6: Modified independent (  Device/Increase time) (in controlled environment of room only) Ambulation Distance (Feet): 200 Feet Assistive device: None Ambulation/Gait Assistance Details: Ambulation distances limited secondary to patient not allowed outside controlled environment of room due to elopement risk, NPO status (patient attempting to eat snacks from family room or RN station) and safety risk to staff and other patients. Gait Gait: Yes Gait Pattern: Within Functional Limits Gait Pattern: Within Functional Limits Stairs / Additional Locomotion Stairs: Yes Stairs Assistance: 5: Supervision (last documented stair negotiation at suprevision level) Stairs Assistance Details (indicate cue type and reason): Stair negotiation attempts limited secondary to patient not allowed outside controlled environment of room due to elopement risk, NPO status (patient attempting to eat snacks from family room or RN station) and safety risk to staff and other patients. Stair Management Technique: Two rails;Alternating pattern;Forwards Number of Stairs: 12 Wheelchair Mobility Wheelchair Mobility: No (patient ambulatory)  Trunk/Postural Assessment  Cervical Assessment Cervical Assessment: Within Functional Limits Thoracic Assessment Thoracic Assessment: Within  Functional Limits Postural Control Postural Control: Within Functional Limits  Balance Balance Balance Assessed: Yes Static Sitting Balance Static Sitting - Balance Support: No upper extremity supported;Feet supported;Feet unsupported Static Sitting - Level of Assistance: 6: Modified independent (Device/Increase time) Static Sitting - Comment/# of Minutes: In controlled environment of room Dynamic Sitting Balance Dynamic Sitting - Balance Support: No upper extremity supported;Feet supported;Feet unsupported Dynamic Sitting - Level of Assistance: 6: Modified independent (Device/Increase time) Sitting balance - Comments: In controlled environment of room Static Standing Balance Static Standing - Balance Support: No upper extremity supported;During functional activity Static Standing - Level of Assistance: 6: Modified independent (Device/Increase time) Static Standing - Comment/# of Minutes: In controlled environment of room Dynamic Standing Balance Dynamic Standing - Balance Support: No upper extremity supported Dynamic Standing - Level of Assistance: 6: Modified independent (Device/Increase time) (In controlled environment of room) Dynamic Standing - Balance Activities: Lateral lean/weight shifting;Forward lean/weight shifting;Reaching for objects;Reaching for weighted objects;Reaching across midline Extremity Assessment  RLE Assessment RLE Assessment: Within Functional Limits RLE Strength RLE Overall Strength: Within Functional Limits for tasks assessed LLE Assessment LLE Assessment: Within Functional Limits LLE Strength LLE Overall Strength: Within Functional Limits for tasks assessed  See FIM for current functional status  [name]. Janaysha Depaulo, PT, DPT 04/07/2014, 3:56 PM

## 2014-04-07 NOTE — Progress Notes (Signed)
Patient ID: Justin Pugh, male   DOB: [birth year], 57 y.o.   MRN: [id number]  LOS: 29 days   Subjective: No events overnight.  Pt was non participant during exam.  Did endorse some tenderness around tube site.  POD1   Objective: Vital signs in last 24 hours: Temp:  [98 F (36.7 C)-99 F (37.2 C)] 99 F (37.2 C) (02/16 0711) Pulse Rate:  [71-87] 84 (02/16 0504) Resp:  [11-28] 18 (02/16 0504) BP: (115-140)/(71-83) 131/75 mmHg (02/16 0504) SpO2:  [98 %-100 %] 100 % (02/16 0504) Weight:  [185 lb 1.6 oz (83.961 kg)-185 lb 11.2 oz (84.233 kg)] 185 lb 1.6 oz (83.961 kg) (02/16 0504) Last BM Date: 04/05/14   Laboratory  CBC  Recent Labs  04/06/14 0915  WBC 12.6*  HGB 13.2  HCT 39.4  PLT 303   BMET  Recent Labs  04/06/14 0915  NA 138  K 4.2  CL 103  CO2 28  GLUCOSE 89  BUN 6  CREATININE 0.69  CALCIUM 9.2     Physical Exam General appearance: alert and no distress Resp: clear to auscultation bilaterally Cardio: regular rate and rhythm GI: Binder in pace.  tube site without hematoma.  Abd soft, some tenderness around tube, no guarding, rebound, bowel sounds normal Pulses: 2+ and symmetric   Assessment/Plan:  POD1 from PEG tube placement.  No signs of peritonitis or active bleeding.  Leave binder in place.  Ok to start feeds.  Emilie RutterMatthew Liliauna Santoni PA-S 04/07/2014

## 2014-04-07 NOTE — Progress Notes (Signed)
Physical Therapy Session Note  Patient Details  Name: Justin Pugh MRN: [id number] Date of Birth: 03/09/1955  Today's Date: 04/07/2014 PT Individual Time: 1330-1415 PT Individual Time Calculation (min): 45 min   Short Term Goals: Week 4:  PT Short Term Goal 1 (Week 4): STGs=LTGs  Skilled Therapeutic Interventions/Progress Updates:    Patient received sitting upright in enclosure bed. Patient presents with wet, vocal quality, but unable to follow commands for clearing throat or coughing and with increasing agitation with cues to do so. Session focused on orientation, patient oriented to self, month, and year, but not to time of day, place, or situation. Patient repeatedly asking to get out of bed to use bathroom throughout conversation. Used negotiation strategy with patient that he is allowed out of bed to use bathroom, but must return to bed when he is done, patient agreeable.  Upon ambulating to bathroom, patient sits on toilet with pants up and velcro noise heard; upon entering bathroom, patient with abdominal binder undone, but stating "I just wanted to look at it." Patient redirected to don binder and asked if he still needs to use bathroom. Patient states he still needs to use bathroom, but upon observation patient maintains sitting on toilet with pants up then flushes after no void or bowel movement, continuing to demonstrate patient's manipulation to be out of enclosure bed. Patient initially refusing to return to bed and making attempts to leave room. Patient able to be redirected to sit on EOB, but refusing to lay down despite reminder of negotiation. Patient eventually returns to supine with loud yelling, cursing, derogatory comments about therapist and sitter. Patient left in enclosure bed with lights off to facilitate de-escalation.   Therapy Documentation Precautions:  Precautions Precautions: Fall Precaution Comments: Elopement risk, agitation Restrictions Weight Bearing  Restrictions: No Pain: Pain Assessment Pain Assessment: No/denies pain Pain Score: 0-No pain Locomotion : Ambulation Ambulation/Gait Assistance: 6: Modified independent (Device/Increase time) (in controlled environment (room only))   See FIM for current functional status  Therapy/Group: Individual Therapy  Chipper HerbBridget S Finnis Colee S. Patrich Heinze, PT, DPT 04/07/2014, 2:39 PM
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
## 2014-04-07 NOTE — Progress Notes (Signed)
Speech Language Pathology Daily Session Note  Patient Details  Name: Justin CohoJeffrey L Pugh MRN: [id number] Date of Birth: 1955-02-26  Today's Date: 04/07/2014 SLP Individual Time: 0850-0910 SLP Individual Time Calculation (min): 20 min and Today's Date: 04/07/2014 SLP Missed Time: 25 Minutes Missed Time Reason: Increased agitation  Short Term Goals: Week 4: SLP Short Term Goal 1 (Week 4): Pt will utilize external aids and environmental cues to demonstrate orientation to place, time and situation with Max cues SLP Short Term Goal 2 (Week 4): Patient will participate in a 30 minute session without verbal or physical agitiation with Max A multimodal cues.  SLP Short Term Goal 3 (Week 4): Pt will follow one-step commands with 50% accuracy throughout basic, functional tasks in structured environment with Max cues SLP Short Term Goal 4 (Week 4): Pt will sustain attention to basic, familiar task in structured environment for 5 minutes with Max cues  Skilled Therapeutic Interventions: Skilled treatment session focused on cognitive goals. Upon arrival, patient was supine while in enclosure bed and awakened with verbal and tactile cues. Patient initially verbalized pain but was agreeable to participate in treatment session. Patient also demonstrated a wet vocal qualitythroughout the session and refused to utilize a throat clear despite Max A multimodal cues and encouragement. SLP facilitated session by providing total A for sequencing of 4 step picture cards of functional and familiar tasks. Patient also required total A for focused attention for ~30 seconds to task and functional conversation. Patient demonstrated increased language of confusion as well as generalized confusion which began to escalate patient. Patient also became verbally agitated with this clinician when he was told he was unable to smoke at this time and began throwing things with a change in demeanor. However, patient was eventually redirected  and eventually agreed to sit at edge of enclosure bed and eventually returned to supine. Patient left zipped in enclosure bed with sitter present. Continue with current plan of care.    FIM:  Comprehension Comprehension Mode: Auditory Comprehension: 2-Understands basic 25 - 49% of the time/requires cueing 51 - 75% of the time Expression Expression: 4-Expresses basic 75 - 89% of the time/requires cueing 10 - 24% of the time. Needs helper to occlude trach/needs to repeat words. Social Interaction Social Interaction: 1-Interacts appropriately less than 25% of the time. May be withdrawn or combative. Problem Solving Problem Solving: 1-Solves basic less than 25% of the time - needs direction nearly all the time or does not effectively solve problems and may need a restraint for safety Memory Memory: 1-Recognizes or recalls less than 25% of the time/requires cueing greater than 75% of the time  Pain Reported pain in abdomen, unable to rate   Therapy/Group: Individual Therapy  Lamae Fosco 04/07/2014, 2:36 PM

## 2014-04-08 ENCOUNTER — Inpatient Hospital Stay (HOSPITAL_COMMUNITY): Payer: Medicaid Other

## 2014-04-08 LAB — GLUCOSE, CAPILLARY
GLUCOSE-CAPILLARY: 100 mg/dL — AB (ref 70–99)
GLUCOSE-CAPILLARY: 114 mg/dL — AB (ref 70–99)
Glucose-Capillary: 104 mg/dL — ABNORMAL HIGH (ref 70–99)
Glucose-Capillary: 107 mg/dL — ABNORMAL HIGH (ref 70–99)

## 2014-04-08 MED ORDER — NALOXONE HCL 0.4 MG/ML IJ SOLN
INTRAMUSCULAR | Status: AC
  Filled 2014-04-08: qty 1

## 2014-04-08 MED ORDER — NALOXONE HCL 0.4 MG/ML IJ SOLN
0.4000 mg | Freq: Once | INTRAMUSCULAR | Status: AC
  Administered 2014-04-08: 0.4 mg via INTRAMUSCULAR

## 2014-04-08 MED ORDER — CHLORDIAZEPOXIDE HCL 25 MG PO CAPS
25.0000 mg | ORAL_CAPSULE | Freq: Two times a day (BID) | ORAL | Status: DC
  Administered 2014-04-08: 25 mg
  Filled 2014-04-08: qty 1
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
## 2014-04-08 NOTE — Progress Notes (Signed)
Speech Language Pathology Weekly Progress Note  Patient Details  Name: Justin Pugh MRN: [id number] Date of Birth: 1955/09/28  Beginning of progress report period: March 29, 2014 End of progress report period: April 08, 2014  Short Term Goals: Week 4: SLP Short Term Goal 1 (Week 4): Pt will utilize external aids and environmental cues to demonstrate orientation to place, time and situation with Max cues SLP Short Term Goal 1 - Progress (Week 4): Not met SLP Short Term Goal 2 (Week 4): Patient will participate in a 30 minute session without verbal or physical agitiation with Max A multimodal cues.  SLP Short Term Goal 2 - Progress (Week 4): Not met SLP Short Term Goal 3 (Week 4): Pt will follow one-step commands with 50% accuracy throughout basic, functional tasks in structured environment with Max cues SLP Short Term Goal 3 - Progress (Week 4): Not met SLP Short Term Goal 4 (Week 4): Pt will sustain attention to basic, familiar task in structured environment for 5 minutes with Max cues SLP Short Term Goal 4 - Progress (Week 4): Not met    New Short Term Goals: N/A  Weekly Progress Updates: Patient has not made any progress towards his cognitive or dysphagia goals and has not met any STG's this reporting period. Currently, Patient is NPO with a PEG due to severe dysphagia as evidenced by continued wet vocal quality due to decreased management of secretions, suspect due to fatigue and cognitive deficits that are exacerbated by current medications as all other testing for origin of new onset dysphagia negative. Suspect patient's current verbal and physical agitation is due primarily to psychosocial issues given presence of higher level cognitive function (manipulative behaviors, working memory, Social research officer, government). Patient currently requires 24/7 supervision of sitter and use of enclosure bed due to risk of elopement and safety risk to staff and other patients. Due to patient's poor progress towards  cognitive-linguistic and dysphagia goals, frequent and significant levels of agitation and escalation, inability to de-escalate, safety risks, and behavioral issues, patient will be placed on hold by SLP services for 1 week and SLP will reassess swallow function if patient is able to safely participate in order to address dysphagia goals. Physician made aware of change in plan of care and is in agreement.    Intensity:  (only 1X/ week for a  maximum of 30 mins ) Frequency: 1 to 3 out of 7 days Treatment/Interventions: Dysphagia/aspiration precaution training;Environmental controls;Functional tasks;Patient/family education;Therapeutic Activities   Martin, [place] 04/08/2014, 3:05 PM  [place]on Anna, [place], [place]

## 2014-04-08 NOTE — Progress Notes (Signed)
[facility] PHYSICAL MEDICINE & REHABILITATION     PROGRESS NOTE    Subjective/Complaints: No changes. Episodically restless and agitated. ROS limited due to cognition  Objective: Vital Signs: Blood pressure 96/66, pulse 101, temperature 98.4 F (36.9 C), temperature source Oral, resp. rate 18, weight 83.416 kg (183 lb 14.4 oz), SpO2 100 %. No results found.  Recent Labs  04/06/14 0915  WBC 12.6*  HGB 13.2  HCT 39.4  PLT 303    Recent Labs  04/06/14 0915  NA 138  K 4.2  CL 103  GLUCOSE 89  BUN 6  CREATININE 0.69  CALCIUM 9.2   CBG (last 3)   Recent Labs  04/07/14 2217 04/08/14 0014 04/08/14 0408  GLUCAP 81 104* 100*    Wt Readings from Last 3 Encounters:  04/08/14 83.416 kg (183 lb 14.4 oz)  03/09/14 84.188 kg (185 lb 9.6 oz)  01/23/14 95.255 kg (210 lb)    Physical Exam:  Constitutional: He appears well-developed and well-nourished. He is sleeping. He is easily aroused.  HENT:  Head: Normocephalic.  Neck: supple Cardiovascular: Normal rate and regular rhythm.  Respiratory: Effort normal. No respiratory distress.  GI: Soft. Bowel sounds are normal. He exhibits no distension. There is no tenderness.  Musculoskeletal: He exhibits no edema.  Neurological: He is alert. Good phonation. A little hoarse  restless.   Moves all extremities without difficulty. equally moves all 4's. Senses pain in all 4. Oriented to name only. confabulates Skin: Skin is warm and dry.  Psychiatric:  Confused   Assessment/Plan: 1. 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 Physiatrist is providing close team supervision and 24 hour management of active medical problems listed below. Physiatrist and rehab team continue to assess barriers to discharge/monitor patient progress toward functional and medical goals.  Have reduced therapies to QD given plateau, patient  cooperation.   FIM: FIM - Bathing Bathing Steps Patient Completed: Chest, Abdomen, Front perineal area, Right Arm, Left Arm, Buttocks, Right upper leg, Left upper leg, Left lower leg (including foot), Right lower leg (including foot) Bathing: 5: Supervision: Safety issues/verbal cues  FIM - Upper Body Dressing/Undressing Upper body dressing/undressing steps patient completed: Thread/unthread right sleeve of pullover shirt/dresss, Thread/unthread left sleeve of pullover shirt/dress, Put head through opening of pull over shirt/dress, Pull shirt over trunk Upper body dressing/undressing: 5: Supervision: Safety issues/verbal cues FIM - Lower Body Dressing/Undressing Lower body dressing/undressing steps patient completed: Thread/unthread right underwear leg, Thread/unthread left underwear leg, Pull underwear up/down, Thread/unthread right pants leg, Thread/unthread left pants leg, Pull pants up/down, Don/Doff right sock, Don/Doff left sock, Don/Doff right shoe, Fasten/unfasten left shoe, Fasten/unfasten right shoe, Don/Doff left shoe Lower body dressing/undressing: 5: Set-up assist to: Obtain clothing  FIM - Toileting Toileting steps completed by patient: Adjust clothing prior to toileting, Performs perineal hygiene, Adjust clothing after toileting Toileting Assistive Devices: Grab bar or rail for support Toileting: 5: Supervision: Safety issues/verbal cues  FIM - Diplomatic Services operational officer Devices: Therapist, music Transfers: 5-To toilet/BSC: Supervision (verbal cues/safety issues), 5-From toilet/BSC: Supervision (verbal cues/safety issues)  FIM - Banker Devices: Arm rests Bed/Chair Transfer: 6: Bed > Chair or W/C: No assist, 6: Sit > Supine: No assist, 6: Supine > Sit: No assist, 6: Chair or W/C > Bed: No assist  FIM - Locomotion: Wheelchair Locomotion: Wheelchair: 0: Activity did not occur FIM - Locomotion: Ambulation Locomotion:  Ambulation Assistive Devices:  Other (comment) (none) Ambulation/Gait Assistance: 6: Modified independent (Device/Increase time) (in controlled environment of room only) Locomotion: Ambulation: 5: Household Independent - travels 50 - 149 ft independent or modified independent  Comprehension Comprehension Mode: Auditory Comprehension: 2-Understands basic 25 - 49% of the time/requires cueing 51 - 75% of the time  Expression Expression Mode: Verbal Expression: 4-Expresses basic 75 - 89% of the time/requires cueing 10 - 24% of the time. Needs helper to occlude trach/needs to repeat words.  Social Interaction Social Interaction Mode: Not assessed Social Interaction: 1-Interacts appropriately less than 25% of the time. May be withdrawn or combative.  Problem Solving Problem Solving: 1-Solves basic less than 25% of the time - needs direction nearly all the time or does not effectively solve problems and may need a restraint for safety  Memory Memory: 1-Recognizes or recalls less than 25% of the time/requires cueing greater than 75% of the time  Medical Problem List and Plan: 1. Functional deficits secondary to TBI  2. DVT Prophylaxis/Anticoagulation: Pharmaceutical: Lovenox 3. Pain Management: Will continue oxycodone prn. Will likely need premedication as unable to express needs. Will monitor for now.  4. Mood: Unable to gauge at this time due to cognitive deficits. LCSW to follow along for evaluation and support once more appropriate.  5. Neuropsych: This patient is not capable of making decisions on her own behalf. -continue in vail for safety 6. Skin/Wound Care: Pressure relief measures. Maintain adequate hydration and nutritional status as able.  7. Fluids/Electrolytes/Nutrition:megace stoppped 8. Recurrent  HCAP: Completed 10 day course of antibiotic regimen 01/17 for MSSA. now on empiric Levaquin 9. Reactive Leucocytosis: Resolving.  10. Hypokalemia: Likely due to  poor nutritional status as well as dilutional .    11. Agitation: still confused,inappropriate,easily agitated  -seroquel  -continue Klonopin reduced to 0.25mg  tid--dc entirely - librium 25mg  tid---decrease to bid---may try to wean off with titration of vpa  -increase depakote to 750mg  bid (already done by psych) -sleep cycle overall better but still not consistent -Continue vail bed for fall prevention and due to poor insight/awareness---not ready to come out of yet  -appreciate psych assessment and input---Central  referral ----transfer potentially when medically stable 13. ?GERD/odynophagia/dysphagia  -PEG placed---tolerated well with mild to moderate abdominal pain  -working on bolus feeds 14. RML pneumonia---levaquin--dc'ed   LOS (Days) 30 A FACE TO FACE EVALUATION WAS PERFORMED  Justin Pugh T 04/08/2014 8:28 AM
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
## 2014-04-08 NOTE — Progress Notes (Signed)
Patient with increased lethargy as well as increased oral secretions /wet sounds earlier this am. On exam patient did not have difficulty handling secretions but has baseline wet voice. Lungs with some decrease at bases. CXR ordered and with question of developing PNA. Likely a component of aspiration pneumonitis due to general as well as sedation due to medications. Will d/c oxycodone. Monitor for improvement in lethargy as well as for other signs of infection. Will check follow up CXR and CBC in am.

## 2014-04-08 NOTE — Progress Notes (Addendum)
RN called due to concerns regarding patient change in status today.  Upon my arrival [facility] [facility].  Oral care done.  Patient with minimal response. Lung sounds clear while patient was quiet. Skin pink, VSS.  Medical Staff adjusting medications affecting mental status.    IM narcan to be given per orders  1815 RN notified me that patient is more awake and sounds "wet".  Night RR RN will follow up during rounds.  If assistance is needed before our arrival RN to call us.

## 2014-04-08 NOTE — Progress Notes (Signed)
Pt girlfriend called to get updates on the patient. Could not find her information in chart so I asked her to hold to talk to my charge RN.  She said "I know I'm not allowed to see him or talk to him again, but I want to know how he is doing?"She hung up while she was on hold, then called again.  I told her I was unable to give her any information and she would need to contact family for any information. She became very upset, cussing and yelling. Told me she would contact my superiors, and then hung up. Charge nurse notified of situation. Rudie MeyerEurillo, Mija Effertz A, RN

## 2014-04-08 NOTE — Plan of Care (Signed)
Problem: RH Memory Goal: LTG Patient will demonstrate ability for day to day (OT) LTG: Patient will demonstrate ability for day to day recall/carryover during activities of daily living with assist (OT)  Outcome: Not Met (add Reason) Not met secondary to significant cognitive deficits and behavioral management issues.   Problem: RH Attention Goal: LTG Patient will demonstrate focused/sustained (OT) LTG: Patient will demonstrate focused/sustained/selective/alternating/divided attention during functional activities in specific environment with assist for # of minutes (OT)  Outcome: Not Met (add Reason) Not met secondary to significant cognitive deficits and behavioral management issues.  Problem: RH Awareness Goal: LTG: Patient will demonstrate intellectual/emergent (OT) LTG: Patient will demonstrate intellectual/emergent/anticipatory awareness with assist during a functional activity (OT)  Outcome: Not Met (add Reason) Not met secondary to significant cognitive deficits and behavioral management issues.

## 2014-04-08 NOTE — Progress Notes (Signed)
Social Work Patient ID: Justin CohoJeffrey L Pugh, male   DOB: [birth year], 57 y.o.   MRN: [id number]   [facility].  Therapies have all d/c'd pt as no gains over past couple of weeks.  Pt remains here on CIR as we await word on whether or not they will even place pt on the waitlist at [facility].  Will keep staff posted on progress.  Shaili Donalson, LCSW

## 2014-04-08 NOTE — Progress Notes (Signed)
Occupational Therapy Discharge Summary  Patient Details  Name: Justin Pugh MRN: [id number] Date of Birth: 03-May-1955  Today's Date: 04/08/2014   Patient has met 8 of 11 long term goals due to improved activity tolerance, improved balance, postural control and improved coordination.  Patient to discharging at modified independent in controlled environment in room only. Patient requires supervision when outside of room. Therapy sessions have been limited to remaining in pt's room secondary to elopement risk, NPO statis as pt attempts to eat snack from multiple areas of hospital, pt a danger to other staff members and patients  Patient's care partner unavailable to provide the necessary cognitive assistance at discharge. Due to patient making minimal progress towards cognitive goals, frequent bouts of agitation, inability to de-escalate, manipulative behaviors, safety risk to staff members and other patients, and significant behavioral issues outside of scope of OT, patient is being discharged from occupational therapy services at this time.   Reasons goals not met: Patient did not meet memory, attention, and awareness goals secondary to significant cognitive deficits and behavioral management deficits. Patient with increased agitation during therapeutic tasks.   Recommendation:  Patient would benefit from ongoing psyc services to address behavioral management issues.  Equipment: No equipment provided  Reasons for discharge: lack of progress toward goals and behavioral management issues outside scope of OT.  Patient/family agrees with progress made and goals achieved: Patient unable and no family present.  OT Discharge Precautions/Restrictions  Precautions Precautions: Fall Precaution Comments: Elopement risk, agitation, PEG Restrictions Weight Bearing Restrictions: No General   Vital Signs  Pain   ADL   Vision/Perception  Vision- History Baseline Vision/History: No visual  deficits Patient Visual Report: No change from baseline Vision- Assessment Vision Assessment?: No apparent visual deficits  Cognition Overall Cognitive Status: Impaired/Different from baseline Arousal/Alertness: Awake/alert Orientation Level: Oriented to person Attention: Sustained Sustained Attention: Impaired Sustained Attention Impairment: Functional basic;Verbal basic Memory: Impaired Memory Impairment: Decreased recall of new information;Retrieval deficit;Prospective memory;Decreased short term memory Awareness: Impaired Awareness Impairment: Intellectual impairment;Emergent impairment;Anticipatory impairment Problem Solving: Impaired Problem Solving Impairment: Verbal basic;Functional basic Behaviors: Restless;Impulsive;Verbal agitation;Physical agitation;Perseveration [employer].es Scales of Cognitive Functioning: Confused/agitated Sensation Sensation Light Touch: Appears Intact Hot/Cold: Appears Intact Proprioception: Appears Intact Coordination Gross Motor Movements are Fluid and Coordinated: Yes Fine Motor Movements are Fluid and Coordinated: Yes Motor  Motor Motor: Within Functional Limits Mobility  Bed Mobility Bed Mobility: Supine to Sit;Sit to Supine Supine to Sit: 6: Modified independent (Device/Increase time);HOB flat Sit to Supine: 6: Modified independent (Device/Increase time);HOB flat Transfers Transfers: Sit to Stand;Stand to Sit Sit to Stand: 6: Modified independent (Device/Increase time);With armrests;Without upper extremity assist;With upper extremity assist;From toilet;From chair/3-in-1;From bed Stand to Sit: 6: Modified independent (Device/Increase time);To chair/3-in-1;To bed;To toilet;With armrests;Without upper extremity assist;With upper extremity assist  Trunk/Postural Assessment  Cervical Assessment Cervical Assessment: Within Functional Limits Thoracic Assessment Thoracic Assessment: Within Functional Limits Postural Control Postural  Control: Within Functional Limits  Balance Balance Balance Assessed: Yes Static Sitting Balance Static Sitting - Balance Support: No upper extremity supported;Feet supported;Feet unsupported Static Sitting - Level of Assistance: 6: Modified independent (Device/Increase time) Dynamic Sitting Balance Dynamic Sitting - Balance Support: No upper extremity supported;Feet supported;Feet unsupported Dynamic Sitting - Level of Assistance: 6: Modified independent (Device/Increase time) Static Standing Balance Static Standing - Balance Support: No upper extremity supported;During functional activity Static Standing - Level of Assistance: 6: Modified independent (Device/Increase time) Dynamic Standing Balance Dynamic Standing - Balance Support: No upper extremity supported Dynamic Standing - Level  of Assistance:  (in controlled environment) Extremity/Trunk Assessment RUE Assessment RUE Assessment: Within Functional Limits (5/5 strength) LUE Assessment LUE Assessment: Within Functional Limits (5/5 strength)  See FIM for current functional status  Justin Pugh, Justin Pugh 04/08/2014, 2:43 PM

## 2014-04-08 NOTE — Progress Notes (Signed)
Recreational Therapy Discharge Summary Patient Details  Name: Justin Pugh MRN: [id number] Date of Birth: April 13, 1955 Today's Date: 04/08/2014  Long term goals set: 1  Long term goals met: 1  Comments on progress toward goals:  Pt is supervision/set up level for simple TR tasks, requires min-max cues for task completion due to cognitive deficits and increasing agitation.  Due to difficulty redirecting or deescalating behavior and increased elopement risks, pt requires 24 hour supervision in hospital room including monitoring while in enclosure bed.  Pt is being discharged from TR services.   Reasons for discharge: increase in agitation requiring behavioral managment outside scope of practice  Patient/family agrees with progress made and goals achieved: No-pt unable, no family present.  [place] 04/08/2014, 9:00 AM

## 2014-04-08 NOTE — Plan of Care (Signed)
Problem: RH Cognition - SLP Goal: RH LTG Patient will demonstrate orientation with cues LTG: Patient will demonstrate orientation to person/place/time/situation with cues (SLP)  Not met due to significant cognitive-linguistic deficits and increased agitation with therapeutic tasks; therapy limited secondary to patient not allowed outside controlled environment of room due to elopement risk, NPO status (patient attempting to eat snacks from family room or RN station) and safety risk to staff and other patients  Problem: RH Problem Solving Goal: LTG Patient will demonstrate problem solving for (SLP) LTG: Patient will demonstrate problem solving for basic/complex daily situations with cues (SLP)  Outcome: Not Met (add Reason) Not met due to significant cognitive-linguistic deficits and increased agitation with therapeutic tasks; therapy limited secondary to patient not allowed outside controlled environment of room due to elopement risk, NPO status (patient attempting to eat snacks from family room or RN station) and safety risk to staff and other patients  Problem: RH Memory Goal: LTG Patient will use memory compensatory aids to (SLP) LTG: Patient will use memory compensatory aids to recall biographical/new, daily complex information with cues (SLP)  Outcome: Not Met (add Reason) Not met due to significant cognitive-linguistic deficits and increased agitation with therapeutic tasks; therapy limited secondary to patient not allowed outside controlled environment of room due to elopement risk, NPO status (patient attempting to eat snacks from family room or RN station) and safety risk to staff and other patients  Problem: RH Attention Goal: LTG Patient will demonstrate focused/sustained (SLP) LTG: Patient will demonstrate focused/sustained/selective/alternating/divided attention during cognitive/linguistic activities in specific environment with assist for # of minutes (SLP)  Outcome: Not Met (add  Reason) Not met due to significant cognitive-linguistic deficits and increased agitation with therapeutic tasks; therapy limited secondary to patient not allowed outside controlled environment of room due to elopement risk, NPO status (patient attempting to eat snacks from family room or RN station) and safety risk to staff and other patients  Problem: RH Awareness Goal: LTG: Patient will demonstrate intellectual/emergent (SLP) LTG: Patient will demonstrate intellectual/emergent/anticipatory awareness with assist during a cognitive/linguistic activity (SLP)  Outcome: Not Met (add Reason) Not met due to significant cognitive-linguistic deficits and increased agitation with therapeutic tasks; therapy limited secondary to patient not allowed outside controlled environment of room due to elopement risk, NPO status (patient attempting to eat snacks from family room or RN station) and safety risk to staff and other patients

## 2014-04-08 NOTE — Progress Notes (Signed)
Chest xray completed and notified Marissa NestlePam Love, PA with findings. During assessment with lunch tube feed dose, Pt sounded 'wet' with slurred words and extreme congestion. Pt unable to cough up secretions or clear throat. Pt back to bed due to pt being lethargic. Tube feed given as scheduled with Marissa NestlePam Love, PA in room at time.  With result of xray and pt's status, RN notified rapid response to come assess pt and 'talk through' pt status. At 1700 Narcan given as ordered by PA. Pt I&O cath for 700cc due to no void throughout day. RR RN provided oral care to pt. Pt did not respond to any treatments given. Arousable but lethargic. RN provided suction of mouth with thick mucus present. At 1815 pt A&O to self only with bouts of agitation. Pt continues to have gargled, wet voice. Called RR to give update on pts status.  Will continue to monitor.

## 2014-04-09 ENCOUNTER — Inpatient Hospital Stay (HOSPITAL_COMMUNITY): Payer: Medicaid Other

## 2014-04-09 LAB — BASIC METABOLIC PANEL
Anion gap: 9 (ref 5–15)
BUN: 9 mg/dL (ref 6–23)
CO2: 30 mmol/L (ref 19–32)
Calcium: 9.6 mg/dL (ref 8.4–10.5)
Chloride: 100 mmol/L (ref 96–112)
Creatinine, Ser: 0.73 mg/dL (ref 0.50–1.35)
GFR calc Af Amer: >90 mL/min (ref >90)
GFR calc non Af Amer: >90 mL/min (ref >90)
GLUCOSE: 102 mg/dL — AB (ref 70–99)
Potassium: 4.1 mmol/L (ref 3.5–5.1)
Sodium: 139 mmol/L (ref 135–145)

## 2014-04-09 LAB — CBC WITH DIFFERENTIAL/PLATELET
Basophils Absolute: 0 10*3/uL (ref 0.0–0.1)
Basophils Relative: 0 % (ref 0–1)
EOS PCT: 2 % (ref 0–5)
Eosinophils Absolute: 0.3 10*3/uL (ref 0.0–0.7)
HEMATOCRIT: 40.1 % (ref 39.0–52.0)
Hemoglobin: 13.4 g/dL (ref 13.0–17.0)
LYMPHS ABS: 2.1 10*3/uL (ref 0.7–4.0)
LYMPHS PCT: 16 % (ref 12–46)
MCH: 31.4 pg (ref 26.0–34.0)
MCHC: 33.4 g/dL (ref 30.0–36.0)
MCV: 93.9 fL (ref 78.0–100.0)
Monocytes Absolute: 1.4 10*3/uL — ABNORMAL HIGH (ref 0.1–1.0)
Monocytes Relative: 10 % (ref 3–12)
Neutro Abs: 9.7 10*3/uL — ABNORMAL HIGH (ref 1.7–7.7)
Neutrophils Relative %: 72 % (ref 43–77)
PLATELETS: 325 10*3/uL (ref 150–400)
RBC: 4.27 MIL/uL (ref 4.22–5.81)
RDW: 12.6 % (ref 11.5–15.5)
WBC: 13.4 10*3/uL — ABNORMAL HIGH (ref 4.0–10.5)

## 2014-04-09 LAB — GLUCOSE, CAPILLARY: Glucose-Capillary: 98 mg/dL (ref 70–99)

## 2014-04-09 MED ORDER — SENNOSIDES 8.8 MG/5ML PO SYRP
15.0000 mL | ORAL_SOLUTION | Freq: Two times a day (BID) | ORAL | Status: DC
  Administered 2014-04-09 – 2014-04-28 (×29): 15 mL
  Filled 2014-04-09 (×43): qty 15

## 2014-04-09 MED ORDER — SORBITOL 70 % SOLN
60.0000 mL | Freq: Once | Status: AC
  Administered 2014-04-09: 60 mL via ORAL

## 2014-04-09 MED ORDER — SORBITOL 70 % SOLN
30.0000 mL | Freq: Two times a day (BID) | Status: DC | PRN
  Filled 2014-04-09: qty 30

## 2014-04-09 MED ORDER — SENNOSIDES 8.8 MG/5ML PO SYRP: 10.0000 mL | ORAL_SOLUTION | Freq: Two times a day (BID) | ORAL | Status: DC
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
## 2014-04-09 NOTE — Plan of Care (Signed)
Problem: RH SAFETY Goal: RH STG DECREASED RISK OF FALL WITH ASSISTANCE STG Decreased Risk of Fall With BJ'sMin Assistance.  Outcome: Not Progressing Enclosure bed

## 2014-04-10 LAB — CBC
HEMATOCRIT: 37.1 % — AB (ref 39.0–52.0)
HEMOGLOBIN: 12.7 g/dL — AB (ref 13.0–17.0)
MCH: 31.7 pg (ref 26.0–34.0)
MCHC: 34.2 g/dL (ref 30.0–36.0)
MCV: 92.5 fL (ref 78.0–100.0)
PLATELETS: 279 10*3/uL (ref 150–400)
RBC: 4.01 MIL/uL — AB (ref 4.22–5.81)
RDW: 12.4 % (ref 11.5–15.5)
WBC: 15.9 10*3/uL — AB (ref 4.0–10.5)

## 2014-04-10 MED ORDER — ACETAMINOPHEN 325 MG PO TABS: 650.0000 mg | ORAL_TABLET | Freq: Three times a day (TID) | ORAL | Status: DC

## 2014-04-10 MED ORDER — LEVOFLOXACIN 250 MG PO TABS
250.0000 mg | ORAL_TABLET | Freq: Every day | ORAL | Status: AC
  Administered 2014-04-10 – 2014-04-16 (×6): 250 mg via ORAL
  Filled 2014-04-10 (×7): qty 1

## 2014-04-10 MED ORDER — BACITRACIN ZINC 500 UNIT/GM EX OINT
1.0000 "application " | TOPICAL_OINTMENT | Freq: Four times a day (QID) | CUTANEOUS | Status: DC
  Administered 2014-04-10 – 2014-05-21 (×99): 1 via TOPICAL
  Filled 2014-04-10 (×5): qty 0.9
  Filled 2014-04-10: qty 28.35
  Filled 2014-04-10 (×10): qty 0.9
  Filled 2014-04-10 (×3): qty 28.35
  Filled 2014-04-10 (×4): qty 0.9

## 2014-04-10 MED ORDER — POLYETHYLENE GLYCOL 3350 17 G PO PACK
17.0000 g | PACK | Freq: Three times a day (TID) | ORAL | Status: DC
  Administered 2014-04-10 – 2014-04-28 (×28): 17 g
  Filled 2014-04-10 (×58): qty 1

## 2014-04-10 MED ORDER — ACETAMINOPHEN 160 MG/5ML PO SOLN
650.0000 mg | Freq: Three times a day (TID) | ORAL | Status: DC
  Administered 2014-04-10 – 2014-04-11 (×4): 650 mg
  Filled 2014-04-10 (×4): qty 20.3
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
## 2014-04-11 MED ORDER — ACETAMINOPHEN 160 MG/5ML PO SOLN
650.0000 mg | Freq: Three times a day (TID) | ORAL | Status: DC
  Administered 2014-04-11 – 2014-05-22 (×159): 650 mg
  Filled 2014-04-11 (×156): qty 20.3

## 2014-04-11 MED ORDER — BACITRACIN ZINC 500 UNIT/GM EX OINT
TOPICAL_OINTMENT | Freq: Two times a day (BID) | CUTANEOUS | Status: DC
  Administered 2014-04-12 – 2014-04-19 (×11): via TOPICAL
  Administered 2014-04-19: 1 via TOPICAL
  Administered 2014-04-20 – 2014-05-02 (×13): via TOPICAL
  Administered 2014-05-02: 1 via TOPICAL
  Administered 2014-05-05 – 2014-05-19 (×13): via TOPICAL
  Filled 2014-04-11 (×3): qty 28.35

## 2014-04-11 NOTE — Progress Notes (Signed)
[place] [facility] MEDICINE & REHABILITATION     PROGRESS NOTE    Subjective/Complaints: Up a bit last night. Removed corset but not reaching at PEG.. ROS limited due to cognition  Objective: Vital Signs: Blood pressure 112/75, pulse 96, temperature 97.5 F (36.4 C), temperature source Oral, resp. rate 16, weight 83.28 kg (183 lb 9.6 oz), SpO2 97 %. No results found.  Recent Labs  04/09/14 0807 04/10/14 0940  WBC 13.4* 15.9*  HGB 13.4 12.7*  HCT 40.1 37.1*  PLT 325 279    Recent Labs  04/09/14 0807  NA 139  K 4.1  CL 100  GLUCOSE 102*  BUN 9  CREATININE 0.73  CALCIUM 9.6   CBG (last 3)   Recent Labs  04/08/14 2019 04/08/14 2357 04/09/14 0403  GLUCAP 114* 107* 98    Wt Readings from Last 3 Encounters:  04/11/14 83.28 kg (183 lb 9.6 oz)  03/09/14 84.188 kg (185 lb 9.6 oz)  01/23/14 95.255 kg (210 lb)    Physical Exam:  Constitutional: He appears well-developed and well-nourished. He is sleeping. He is easily aroused.  HENT:  Head: Normocephalic.  Neck: supple Cardiovascular: Normal rate and regular rhythm.  Respiratory: Effort normal. No respiratory distress.  GI: Soft. Bowel sounds are normal. He exhibits no distension. There is mild abdominal tenderness Musculoskeletal: He exhibits no edema.  Neurological: He is alert today.  Phonation improved. A little hoarse  restless.   Moves all extremities without difficulty. equally moves all 4's. Senses pain in all 4. Oriented to name only. confabulates Skin: Skin is warm and dry.  Psychiatric:  Confused   Assessment/Plan: 1. 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 Physiatrist is providing close team supervision and 24 hour management of active medical problems listed below. Physiatrist and rehab team continue to assess barriers to discharge/monitor patient progress toward functional and medical  goals.  Therapies dc'ed. Pt does not need assistance to ambulate.    FIM: FIM - Bathing Bathing Steps Patient Completed: Chest, Abdomen, Front perineal area, Right Arm, Left Arm, Buttocks, Right upper leg, Left upper leg, Left lower leg (including foot), Right lower leg (including foot) Bathing: 5: Supervision: Safety issues/verbal cues  FIM - Upper Body Dressing/Undressing Upper body dressing/undressing steps patient completed: Thread/unthread right sleeve of pullover shirt/dresss, Thread/unthread left sleeve of pullover shirt/dress, Put head through opening of pull over shirt/dress, Pull shirt over trunk Upper body dressing/undressing: 5: Supervision: Safety issues/verbal cues FIM - Lower Body Dressing/Undressing Lower body dressing/undressing steps patient completed: Thread/unthread right underwear leg, Thread/unthread left underwear leg, Pull underwear up/down, Thread/unthread right pants leg, Thread/unthread left pants leg, Pull pants up/down, Don/Doff right sock, Don/Doff left sock, Don/Doff right shoe, Fasten/unfasten left shoe, Fasten/unfasten right shoe, Don/Doff left shoe Lower body dressing/undressing: 5: Set-up assist to: Obtain clothing  FIM - Toileting Toileting steps completed by patient: Adjust clothing prior to toileting, Performs perineal hygiene, Adjust clothing after toileting Toileting Assistive Devices: Grab bar or rail for support Toileting: 5: Supervision: Safety issues/verbal cues  FIM - Diplomatic Services operational officerToilet Transfers Toilet Transfers Assistive Devices: Therapist, musicGrab bars Toilet Transfers: 5-To toilet/BSC: Supervision (verbal cues/safety issues), 5-From toilet/BSC: Supervision (verbal cues/safety issues)  FIM - BankerBed/Chair Transfer Bed/Chair Transfer Assistive Devices: Arm rests Bed/Chair Transfer: 6: Bed > Chair or W/C: No assist, 6: Sit > Supine: No assist, 6: Supine > Sit: No assist, 6: Chair or W/C > Bed: No assist  FIM - Locomotion: Wheelchair Locomotion:  Wheelchair: 0: Activity  did not occur FIM - Locomotion: Ambulation Locomotion: Ambulation Assistive Devices: Other (comment) (none) Ambulation/Gait Assistance: 6: Modified independent (Device/Increase time) (in controlled environment of room only) Locomotion: Ambulation: 5: Household Independent - travels 50 - 149 ft independent or modified independent  Comprehension Comprehension Mode: Auditory Comprehension: 2-Understands basic 25 - 49% of the time/requires cueing 51 - 75% of the time  Expression Expression Mode: Verbal Expression: 4-Expresses basic 75 - 89% of the time/requires cueing 10 - 24% of the time. Needs helper to occlude trach/needs to repeat words.  Social Interaction Social Interaction Mode: Not assessed Social Interaction: 1-Interacts appropriately less than 25% of the time. May be withdrawn or combative.  Problem Solving Problem Solving: 1-Solves basic less than 25% of the time - needs direction nearly all the time or does not effectively solve problems and may need a restraint for safety  Memory Memory: 1-Recognizes or recalls less than 25% of the time/requires cueing greater than 75% of the time  Medical Problem List and Plan: 1. Functional deficits secondary to TBI   2. DVT Prophylaxis/Anticoagulation: Pharmaceutical: Lovenox 3. Pain Management: Will continue oxycodone prn. Will likely need premedication as unable to express needs. Will monitor for now.  4. Mood: Unable to gauge at this time due to cognitive deficits. LCSW to follow along for evaluation and support once more appropriate.  5. Neuropsych: This patient is not capable of making decisions on her own behalf. -continue in vail for safety 6. Skin/Wound Care: pt is mobile 7. Fluids/Electrolytes/Nutrition:megace stoppped 8. Recurrent  HCAP:  resolved 9. Reactive Leucocytosis: likely stress related 10. Hypokalemia: replaced .    11. Agitation: still confused,inappropriate,easily agitated  -seroquel   -klonopin and librium stopped due to lethargy  -increased depakote to  bid   -sleep cycle overall better  -Continues to require vail bed for fall prevention and due to poor insight/awareness  -appreciate psych assessment and input--- 13. ?GERD/odynophagia/dysphagia  -PEG placed and functioning  - bolus feeds  -hasn't tried to pull out tube yet 14. RML pneumonia---levaquin--dc'ed  -cxr on 2/18 WITHOUT ACUTE FINDINGS  -"gurgling" due to sedation yesterday---meds reduced, oxycodone dc'ed.  improved this morning  -has had some leukocytosis--resumed levaquin  -follow clinically for now   LOS (Days) 33 A FACE TO FACE EVALUATION WAS PERFORMED  SWARTZ,ZACHARY T 04/11/2014 9:12 AM

## 2014-04-11 NOTE — Progress Notes (Addendum)
Patient high ly agitated since begin of shift. Unable to redirect patient with staff members, Security paged to assist with patient to get back in bed. Successful, but patient continues to yell out and constantly pulls abdominal binder off, exposing PEG tube and potential of pulling at tube. Sitter at the bedside with patient, continues to be fixated on going hope and wanting food, although he is NPO. Multiple attempts to redirect patient and calming techniques attempted with minimal results. Cont plan of care. Justin Pugh, Justin Pugh Addendum: Unable to given medications as scheduled due to severe agitation and possibility of physical contact.  Bryce Cheever, Justin Pugh

## 2014-04-12 NOTE — Progress Notes (Signed)
Medications given around 0025, patient was less agitated and easily directed. Directed patient back to bed without difficulty, sitter at bedside, cont plan of care. Ferron Ishmael, Phill MutterMelissa Rebecca

## 2014-04-12 NOTE — Progress Notes (Signed)
[place] PHYSICAL MEDICINE & REHABILITATION     PROGRESS NOTE    Subjective/Complaints: Up a bit last night. Removed corset but not reaching at PEG.. ROS limited due to cognition  Objective: Vital Signs: Blood pressure 116/76, pulse 97, temperature 98.8 F (37.1 C), temperature source Oral, resp. rate 18, weight 82.2 kg (181 lb 3.5 oz), SpO2 99 %. No results found.  Recent Labs  04/10/14 0940  WBC 15.9*  HGB 12.7*  HCT 37.1*  PLT 279   No results for input(s): NA, K, CL, GLUCOSE, BUN, CREATININE, CALCIUM in the last 72 hours.  Invalid input(s): CO CBG (last 3)  No results for input(s): GLUCAP in the last 72 hours.  Wt Readings from Last 3 Encounters:  04/12/14 82.2 kg (181 lb 3.5 oz)  03/09/14 84.188 kg (185 lb 9.6 oz)  01/23/14 95.255 kg (210 lb)    Physical Exam:  Constitutional: He appears well-developed and well-nourished. He is sleeping. He is easily aroused.  HENT:  Head: Normocephalic.  Neck: supple Cardiovascular: Normal rate and regular rhythm.  Respiratory: Effort normal. No respiratory distress.  GI: Soft. Bowel sounds are normal. He exhibits no distension. There is mild abdominal tenderness Musculoskeletal: He exhibits no edema.  Neurological: He is alert today.  Phonation improved. A little hoarse  restless.   Moves all extremities without difficulty. equally moves all 4's. Senses pain in all 4. Oriented to name only. confabulates Skin: Skin is warm and dry.  Psychiatric:  Confused   Assessment/Plan: 1. 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 Physiatrist is providing close team supervision and 24 hour management of active medical problems listed below. Physiatrist and rehab team continue to assess barriers to discharge/monitor patient progress toward functional and medical goals.  Therapies dc'ed. Pt does not need assistance to ambulate.     FIM: FIM - Bathing Bathing Steps Patient Completed: Chest, Abdomen, Front perineal area, Right Arm, Left Arm, Buttocks, Right upper leg, Left upper leg, Left lower leg (including foot), Right lower leg (including foot) Bathing: 5: Supervision: Safety issues/verbal cues  FIM - Upper Body Dressing/Undressing Upper body dressing/undressing steps patient completed: Thread/unthread right sleeve of pullover shirt/dresss, Thread/unthread left sleeve of pullover shirt/dress, Put head through opening of pull over shirt/dress, Pull shirt over trunk Upper body dressing/undressing: 5: Supervision: Safety issues/verbal cues FIM - Lower Body Dressing/Undressing Lower body dressing/undressing steps patient completed: Thread/unthread right underwear leg, Thread/unthread left underwear leg, Pull underwear up/down, Thread/unthread right pants leg, Thread/unthread left pants leg, Pull pants up/down, Don/Doff right sock, Don/Doff left sock, Don/Doff right shoe, Fasten/unfasten left shoe, Fasten/unfasten right shoe, Don/Doff left shoe Lower body dressing/undressing: 5: Set-up assist to: Obtain clothing  FIM - Toileting Toileting steps completed by patient: Adjust clothing prior to toileting, Performs perineal hygiene, Adjust clothing after toileting Toileting Assistive Devices: Grab bar or rail for support Toileting: 5: Supervision: Safety issues/verbal cues  FIM - Diplomatic Services operational officerToilet Transfers Toilet Transfers Assistive Devices: Therapist, musicGrab bars Toilet Transfers: 5-To toilet/BSC: Supervision (verbal cues/safety issues), 5-From toilet/BSC: Supervision (verbal cues/safety issues)  FIM - BankerBed/Chair Transfer Bed/Chair Transfer Assistive Devices: Arm rests Bed/Chair Transfer: 6: Bed > Chair or W/C: No assist, 6: Sit > Supine: No assist, 6: Supine > Sit: No assist, 6: Chair or W/C > Bed: No assist  FIM - Locomotion: Wheelchair Locomotion: Wheelchair: 0: Activity did not occur FIM - Locomotion: Ambulation Locomotion: Ambulation  Assistive Devices: Other (comment) (none) Ambulation/Gait Assistance: 6: Modified  independent (Device/Increase time) (in controlled environment of room only) Locomotion: Ambulation: 5: Household Independent - travels 50 - 149 ft independent or modified independent  Comprehension Comprehension Mode: Auditory Comprehension: 2-Understands basic 25 - 49% of the time/requires cueing 51 - 75% of the time  Expression Expression Mode: Verbal Expression: 4-Expresses basic 75 - 89% of the time/requires cueing 10 - 24% of the time. Needs helper to occlude trach/needs to repeat words.  Social Interaction Social Interaction Mode: Not assessed Social Interaction: 1-Interacts appropriately less than 25% of the time. May be withdrawn or combative.  Problem Solving Problem Solving: 1-Solves basic less than 25% of the time - needs direction nearly all the time or does not effectively solve problems and may need a restraint for safety  Memory Memory: 1-Recognizes or recalls less than 25% of the time/requires cueing greater than 75% of the time  Medical Problem List and Plan: 1. Functional deficits secondary to TBI   2. DVT Prophylaxis/Anticoagulation: Pharmaceutical: Lovenox 3. Pain Management: Will continue oxycodone prn. Will likely need premedication as unable to express needs. Will monitor for now.  4. Mood: Unable to gauge at this time due to cognitive deficits. LCSW to follow along for evaluation and support once more appropriate.  5. Neuropsych: This patient is not capable of making decisions on her own behalf. -continue in vail for safety 6. Skin/Wound Care: pt is mobile 7. Fluids/Electrolytes/Nutrition:megace stoppped .    8. Agitation: still confused,inappropriate,easily agitated  -seroquel  -klonopin and librium stopped due to lethargy  -increased depakote to  bid   -sleep cycle overall better  -Continues to require  vail bed for fall prevention and due to poor insight/awareness  -appreciate psych assessment and input--- 9. ?GERD/odynophagia/dysphagia  -PEG placed and functioning  - bolus feeds  -hasn't tried to pull out tube yet 10. RML pneumonia---levaquin--dc'ed  -cxr on 2/18 WITHOUT ACUTE FINDINGS  -afebrile  -has had some leukocytosis--resumed levaquin  -follow clinically for now  -recheck cbc in am 11. Bowel and bladder: emptying   LOS (Days) 34 A FACE TO FACE EVALUATION WAS PERFORMED  Gearldean Lomanto T 04/12/2014 9:50 AM

## 2014-04-13 LAB — URINALYSIS, ROUTINE W REFLEX MICROSCOPIC
Bilirubin Urine: NEGATIVE
GLUCOSE, UA: NEGATIVE mg/dL
Hgb urine dipstick: NEGATIVE
KETONES UR: 15 mg/dL — AB
LEUKOCYTES UA: NEGATIVE
Nitrite: NEGATIVE
Protein, ur: NEGATIVE mg/dL
Specific Gravity, Urine: 1.02 (ref 1.005–1.030)
Urobilinogen, UA: 1 mg/dL (ref 0.0–1.0)
pH: 7.5 (ref 5.0–8.0)

## 2014-04-13 LAB — CBC
HEMATOCRIT: 39.8 % (ref 39.0–52.0)
HEMOGLOBIN: 13.1 g/dL (ref 13.0–17.0)
MCH: 31 pg (ref 26.0–34.0)
MCHC: 32.9 g/dL (ref 30.0–36.0)
MCV: 94.3 fL (ref 78.0–100.0)
Platelets: 346 10*3/uL (ref 150–400)
RBC: 4.22 MIL/uL (ref 4.22–5.81)
RDW: 13 % (ref 11.5–15.5)
WBC: 12 10*3/uL — ABNORMAL HIGH (ref 4.0–10.5)

## 2014-04-13 NOTE — Plan of Care (Signed)
Problem: RH BOWEL ELIMINATION Goal: RH STG MANAGE BOWEL WITH ASSISTANCE STG Manage Bowel with Min Assistance.  Outcome: Not Progressing Incontinent at times  Problem: RH SAFETY Goal: RH STG ADHERE TO SAFETY PRECAUTIONS W/ASSISTANCE/DEVICE STG Adhere to Safety Precautions With min Assistance/Device.  Outcome: Not Progressing Requiring restraints and sitters. Goal: RH STG DECREASED RISK OF FALL WITH ASSISTANCE STG Decreased Risk of Fall With BJ'sMin Assistance.  Outcome: Not Progressing Agitated at times, which increases fall risk.

## 2014-04-13 NOTE — Progress Notes (Signed)
Good night sleep. 0 residual from peg at 2200 & 0500. No stools last night. Continent of urine with staff holding urinal. Urine amber and malodorous. Clean catch Urine specimen sent this AM. PEG site red with drainage and tender to touch. Continues to require enclosure bed and sitter for safety. Justin Pugh, Justin Pugh

## 2014-04-13 NOTE — Progress Notes (Signed)
[place] PHYSICAL MEDICINE & REHABILITATION     PROGRESS NOTE    Subjective/Complaints: Had a good night. Up and alert this morning. ROS limited due to cognition  Objective: Vital Signs: Blood pressure 109/86, pulse 80, temperature 97.7 F (36.5 C), temperature source Oral, resp. rate 18, weight 82.2 kg (181 lb 3.5 oz), SpO2 95 %. No results found.  Recent Labs  04/10/14 0940 04/13/14 0720  WBC 15.9* 12.0*  HGB 12.7* 13.1  HCT 37.1* 39.8  PLT 279 346   No results for input(s): NA, K, CL, GLUCOSE, BUN, CREATININE, CALCIUM in the last 72 hours.  Invalid input(s): CO CBG (last 3)  No results for input(s): GLUCAP in the last 72 hours.  Wt Readings from Last 3 Encounters:  04/12/14 82.2 kg (181 lb 3.5 oz)  03/09/14 84.188 kg (185 lb 9.6 oz)  01/23/14 95.255 kg (210 lb)    Physical Exam:  Constitutional: He appears well-developed and well-nourished. He is sleeping. He is easily aroused.  HENT:  Head: Normocephalic.  Neck: supple Cardiovascular: Normal rate and regular rhythm.  Respiratory: Effort normal. No respiratory distress.  GI: Soft. Bowel sounds are normal. He exhibits no distension. There is mild abdominal tenderness, peg site a little red Musculoskeletal: He exhibits no edema.  Neurological: He is alert today.  Phonation improved. A little hoarse  restless.   Moves all extremities without difficulty. equally moves all 4's. Senses pain in all 4. Oriented to name only. confabulates Skin: Skin is warm and dry.  Psychiatric:  Confused   Assessment/Plan: 1. 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 Physiatrist is providing close team supervision and 24 hour management of active medical problems listed below. Physiatrist and rehab team continue to assess barriers to discharge/monitor patient progress toward functional and medical goals.  Therapies dc'ed.  Pt does not need assistance to ambulate.    FIM: FIM - Bathing Bathing Steps Patient Completed: Chest, Abdomen, Front perineal area, Right Arm, Left Arm, Buttocks, Right upper leg, Left upper leg, Left lower leg (including foot), Right lower leg (including foot) Bathing: 5: Supervision: Safety issues/verbal cues  FIM - Upper Body Dressing/Undressing Upper body dressing/undressing steps patient completed: Thread/unthread right sleeve of pullover shirt/dresss, Thread/unthread left sleeve of pullover shirt/dress, Put head through opening of pull over shirt/dress, Pull shirt over trunk Upper body dressing/undressing: 5: Supervision: Safety issues/verbal cues FIM - Lower Body Dressing/Undressing Lower body dressing/undressing steps patient completed: Thread/unthread right underwear leg, Thread/unthread left underwear leg, Pull underwear up/down, Thread/unthread right pants leg, Thread/unthread left pants leg, Pull pants up/down, Don/Doff right sock, Don/Doff left sock, Don/Doff right shoe, Fasten/unfasten left shoe, Fasten/unfasten right shoe, Don/Doff left shoe Lower body dressing/undressing: 5: Set-up assist to: Obtain clothing  FIM - Toileting Toileting steps completed by patient: Adjust clothing prior to toileting, Performs perineal hygiene, Adjust clothing after toileting Toileting Assistive Devices: Grab bar or rail for support Toileting: 5: Supervision: Safety issues/verbal cues  FIM - Diplomatic Services operational officerToilet Transfers Toilet Transfers Assistive Devices: Therapist, musicGrab bars Toilet Transfers: 5-To toilet/BSC: Supervision (verbal cues/safety issues), 5-From toilet/BSC: Supervision (verbal cues/safety issues)  FIM - BankerBed/Chair Transfer Bed/Chair Transfer Assistive Devices: Arm rests Bed/Chair Transfer: 6: Bed > Chair or W/C: No assist, 6: Sit > Supine: No assist, 6: Supine > Sit: No assist, 6: Chair or W/C > Bed: No assist  FIM - Locomotion: Wheelchair Locomotion: Wheelchair: 0: Activity did not occur FIM -  Locomotion: Ambulation Locomotion: Health visitorAmbulation Assistive  Devices: Other (comment) (none) Ambulation/Gait Assistance: 6: Modified independent (Device/Increase time) (in controlled environment of room only) Locomotion: Ambulation: 5: Household Independent - travels 50 - 149 ft independent or modified independent  Comprehension Comprehension Mode: Auditory Comprehension: 2-Understands basic 25 - 49% of the time/requires cueing 51 - 75% of the time  Expression Expression Mode: Verbal Expression: 4-Expresses basic 75 - 89% of the time/requires cueing 10 - 24% of the time. Needs helper to occlude trach/needs to repeat words.  Social Interaction Social Interaction Mode: Not assessed Social Interaction: 1-Interacts appropriately less than 25% of the time. May be withdrawn or combative.  Problem Solving Problem Solving: 1-Solves basic less than 25% of the time - needs direction nearly all the time or does not effectively solve problems and may need a restraint for safety  Memory Memory: 1-Recognizes or recalls less than 25% of the time/requires cueing greater than 75% of the time  Medical Problem List and Plan: 1. Functional deficits secondary to TBI   2. DVT Prophylaxis/Anticoagulation: Pharmaceutical: Lovenox 3. Pain Management: Will continue oxycodone prn. Will likely need premedication as unable to express needs. Will monitor for now.  4. Mood: Unable to gauge at this time due to cognitive deficits. LCSW to follow along for evaluation and support once more appropriate.  5. Neuropsych: This patient is not capable of making decisions on her own behalf. -continue in vail for safety 6. Skin/Wound Care: pt is mobile 7. Fluids/Electrolytes/Nutrition:megace stoppped .    8. Agitation: still confused,inappropriate,easily agitated  -seroquel  -klonopin and librium stopped due to lethargy  -increased depakote to  bid   -sleep cycle  overall better  -Continues to require vail bed for fall prevention and due to poor insight/awareness  -appreciate psych assessment and input--- 9. ?GERD/odynophagia/dysphagia  -PEG placed and functioning  - bolus feeds  -hasn't tried to pull out tube yet 10. RML pneumonia---levaquin--dc'ed  -cxr on 2/18 WITHOUT ACUTE FINDINGS  -afebrile  -has had some leukocytosis--resumed levaquin---continue for a 7 day course then dc  -cbc 12.2 today 11. Bowel and bladder: emptying   LOS (Days) 35 A FACE TO FACE EVALUATION WAS PERFORMED  Sophronia Varney T 04/13/2014 8:17 AM

## 2014-04-14 LAB — URINE CULTURE: Colony Count: 30000
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
## 2014-04-14 NOTE — Progress Notes (Signed)
[facility] PHYSICAL MEDICINE & REHABILITATION     PROGRESS NOTE    Subjective/Complaints: Behavior up and down. Can be agitated at times. ROS limited due to cognition  Objective: Vital Signs: Blood pressure 117/56, pulse 74, temperature 98.8 F (37.1 C), temperature source Oral, resp. rate 18, weight 82.2 kg (181 lb 3.5 oz), SpO2 99 %. No results found.  Recent Labs  04/13/14 0720  WBC 12.0*  HGB 13.1  HCT 39.8  PLT 346   No results for input(s): NA, K, CL, GLUCOSE, BUN, CREATININE, CALCIUM in the last 72 hours.  Invalid input(s): CO CBG (last 3)  No results for input(s): GLUCAP in the last 72 hours.  Wt Readings from Last 3 Encounters:  04/12/14 82.2 kg (181 lb 3.5 oz)  03/09/14 84.188 kg (185 lb 9.6 oz)  01/23/14 95.255 kg (210 lb)    Physical Exam:  Constitutional: He appears well-developed and well-nourished. He is sleeping. He is easily aroused.  HENT:  Head: Normocephalic.  Neck: supple Cardiovascular: Normal rate and regular rhythm.  Respiratory: Effort normal. No respiratory distress.  GI: Soft. Bowel sounds are normal. He exhibits no distension. There is mild abdominal tenderness, peg site a little red Musculoskeletal: He exhibits no edema.  Neurological: He is alert today.  Phonation improved. A little hoarse  restless.   Moves all extremities without difficulty. equally moves all 4's. Senses pain in all 4. Oriented to name only. confabulates Skin: Skin is warm and dry.  Psychiatric:  Confused   Assessment/Plan: 1. 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 Physiatrist is providing close team supervision and 24 hour management of active medical problems listed below. Physiatrist and rehab team continue to assess barriers to discharge/monitor patient progress toward functional and medical goals.  Therapies dc'ed. Pt does not need assistance to  ambulate.    FIM: FIM - Bathing Bathing Steps Patient Completed: Chest, Abdomen, Front perineal area, Right Arm, Left Arm, Buttocks, Right upper leg, Left upper leg, Left lower leg (including foot), Right lower leg (including foot) Bathing: 5: Supervision: Safety issues/verbal cues  FIM - Upper Body Dressing/Undressing Upper body dressing/undressing steps patient completed: Thread/unthread right sleeve of pullover shirt/dresss, Thread/unthread left sleeve of pullover shirt/dress, Put head through opening of pull over shirt/dress, Pull shirt over trunk Upper body dressing/undressing: 5: Supervision: Safety issues/verbal cues FIM - Lower Body Dressing/Undressing Lower body dressing/undressing steps patient completed: Thread/unthread right underwear leg, Thread/unthread left underwear leg, Pull underwear up/down, Thread/unthread right pants leg, Thread/unthread left pants leg, Pull pants up/down, Don/Doff right sock, Don/Doff left sock, Don/Doff right shoe, Fasten/unfasten left shoe, Fasten/unfasten right shoe, Don/Doff left shoe Lower body dressing/undressing: 5: Set-up assist to: Obtain clothing  FIM - Toileting Toileting steps completed by patient: Adjust clothing prior to toileting, Performs perineal hygiene, Adjust clothing after toileting Toileting Assistive Devices: Grab bar or rail for support Toileting: 5: Supervision: Safety issues/verbal cues  FIM - Diplomatic Services operational officerToilet Transfers Toilet Transfers Assistive Devices: Therapist, musicGrab bars Toilet Transfers: 5-To toilet/BSC: Supervision (verbal cues/safety issues), 5-From toilet/BSC: Supervision (verbal cues/safety issues)  FIM - BankerBed/Chair Transfer Bed/Chair Transfer Assistive Devices: Arm rests Bed/Chair Transfer: 6: Bed > Chair or W/C: No assist, 6: Sit > Supine: No assist, 6: Supine > Sit: No assist, 6: Chair or W/C > Bed: No assist  FIM - Locomotion: Wheelchair Locomotion: Wheelchair: 0: Activity did not occur FIM - Locomotion: Ambulation Locomotion:  Ambulation Assistive Devices: Other (comment) (none) Ambulation/Gait Assistance:  6: Modified independent (Device/Increase time) (in controlled environment of room only) Locomotion: Ambulation: 5: Household Independent - travels 50 - 149 ft independent or modified independent  Comprehension Comprehension Mode: Auditory Comprehension: 1-Understands basic less than 25% of the time/requires cueing 75% of the time  Expression Expression Mode: Verbal Expression: 2-Expresses basic 25 - 49% of the time/requires cueing 50 - 75% of the time. Uses single words/gestures.  Social Interaction Social Interaction Mode: Not assessed Social Interaction: 1-Interacts appropriately less than 25% of the time. May be withdrawn or combative.  Problem Solving Problem Solving: 1-Solves basic less than 25% of the time - needs direction nearly all the time or does not effectively solve problems and may need a restraint for safety  Memory Memory: 1-Recognizes or recalls less than 25% of the time/requires cueing greater than 75% of the time  Medical Problem List and Plan: 1. Functional deficits secondary to TBI   2. DVT Prophylaxis/Anticoagulation: Pharmaceutical: Lovenox 3. Pain Management: Will continue oxycodone prn. Will likely need premedication as unable to express needs. Will monitor for now.  4. Mood: Unable to gauge at this time due to cognitive deficits. LCSW to follow along for evaluation and support once more appropriate.  5. Neuropsych: This patient is not capable of making decisions on her own behalf. -continue in vail for safety 6. Skin/Wound Care: pt is mobile 7. Fluids/Electrolytes/Nutrition:megace stoppped .    8. Agitation: still confused,inappropriate,easily agitated  -seroquel  -klonopin and librium stopped due to lethargy  -increased depakote to  bid   -sleep cycle overall better  -Continues to require [place] bed for  fall prevention and due to poor insight/awareness  -appreciate psych assessment and input--- 9. ?GERD/odynophagia/dysphagia  -PEG placed and functioning  - bolus feeds  -hasn't tried to pull out tube yet 10. RML pneumonia---levaquin--dc'ed  -cxr on 2/18 WITHOUT ACUTE FINDINGS  -afebrile  -has had some leukocytosis--resumed levaquin---continue for a 7 day course then dc  -wbc's improved 11. Bowel and bladder: emptying   LOS (Days) 36 A FACE TO FACE EVALUATION WAS PERFORMED  Justin Pugh T 04/14/2014 8:18 AM

## 2014-04-15 ENCOUNTER — Inpatient Hospital Stay (HOSPITAL_COMMUNITY): Payer: Medicaid Other | Admitting: Speech Pathology
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
## 2014-04-15 NOTE — Progress Notes (Signed)
Social Work Patient ID: Justin Pugh, male   DOB: 03-25-1955, 59 y.o.   MRN: [id number]   [facility]nie" that pt was denied by their MD.  Explained that I was told yesterday that pt was "on the waitlist" to which she stated this was a mistake -   "he's never been on the waitlist".  Reports that MD declined pt due to him being in enclosure bed.  Attempted to explain to her that he would not require an enclosure bed at their facility as they are a locked unit.  Junious DresserConnie states that I need to re-submit admission request to start review process again.   Very frustrating - will discuss with administration about [facility].  Also contacting all state TBI programs to check if any other possible options.  Continue to follow.  Oyindamola Key, LCSW

## 2014-04-15 NOTE — Progress Notes (Signed)
Speech Language Pathology Daily Session Note  Patient Details  Name: Justin CohoJeffrey L Rutledge MRN: [id number] Date of Birth: Sep 22, 1955  Today's Date: 04/15/2014 SLP Individual Time: 1000-1015 SLP Individual Time Calculation (min): 15 min  Short Term Goals: STG's=LTG's  Skilled Therapeutic Interventions: Skilled treatment session focused on dysphagia goals and reassessment of swallowing function. Upon arrival, patient was sitting upright EOB with RN.  Upon entering room, patient demonstrated a wet vocal quality with anterior spillage due to poor management of secretions.  Patient declined to utilize a cued throat clear to clear wet vocal quality despite max encouragement and multiple attempts. Therefore, trials were not attempted due to patient with a baseline wet vocal quality and inability to follow directions for utilization of swallowing compensatory strategies.    FIM:  Comprehension Comprehension Mode: Auditory Comprehension: 2-Understands basic 25 - 49% of the time/requires cueing 51 - 75% of the time Expression Expression Mode: Verbal Expression: 2-Expresses basic 25 - 49% of the time/requires cueing 50 - 75% of the time. Uses single words/gestures. Social Interaction Social Interaction: 1-Interacts appropriately less than 25% of the time. May be withdrawn or combative. Problem Solving Problem Solving: 1-Solves basic less than 25% of the time - needs direction nearly all the time or does not effectively solve problems and may need a restraint for safety Memory Memory: 1-Recognizes or recalls less than 25% of the time/requires cueing greater than 75% of the time  Pain No reports of pain throughout the session   Therapy/Group: Individual Therapy  Dsean Vantol 04/15/2014, 2:02 PM

## 2014-04-15 NOTE — Progress Notes (Signed)
[place] PHYSICAL MEDICINE & REHABILITATION     PROGRESS NOTE    Subjective/Complaints: Labile behavior at times. No changes ROS limited due to cognition  Objective: Vital Signs: Blood pressure 117/56, pulse 74, temperature 98.8 F (37.1 C), temperature source Oral, resp. rate 18, weight 82.2 kg (181 lb 3.5 oz), SpO2 99 %. No results found.  Recent Labs  04/13/14 0720  WBC 12.0*  HGB 13.1  HCT 39.8  PLT 346   No results for input(s): NA, K, CL, GLUCOSE, BUN, CREATININE, CALCIUM in the last 72 hours.  Invalid input(s): CO CBG (last 3)  No results for input(s): GLUCAP in the last 72 hours.  Wt Readings from Last 3 Encounters:  04/12/14 82.2 kg (181 lb 3.5 oz)  03/09/14 84.188 kg (185 lb 9.6 oz)  01/23/14 95.255 kg (210 lb)    Physical Exam:  Constitutional: He appears well-developed and well-nourished. He is sleeping. He is easily aroused.  HENT:  Head: Normocephalic.  Neck: supple Cardiovascular: Normal rate and regular rhythm.  Respiratory: Effort normal. No respiratory distress.  GI: Soft. Bowel sounds are normal. He exhibits no distension. There is mild abdominal tenderness, peg site a little red Musculoskeletal: He exhibits no edema.  Neurological: He is alert today.  Phonation improved. A little hoarse  restless.   Moves all extremities without difficulty. equally moves all 4's. Senses pain in all 4. Oriented to name only. confabulates Skin: Skin is warm and dry.  Psychiatric:  Confused   Assessment/Plan: 1. 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 Physiatrist is providing close team supervision and 24 hour management of active medical problems listed below. Physiatrist and rehab team continue to assess barriers to discharge/monitor patient progress toward functional and medical goals.  Therapies dc'ed. Pt does not need assistance to ambulate.     FIM: FIM - Bathing Bathing Steps Patient Completed: Chest, Abdomen, Front perineal area, Right Arm, Left Arm, Buttocks, Right upper leg, Left upper leg, Left lower leg (including foot), Right lower leg (including foot) Bathing: 5: Supervision: Safety issues/verbal cues  FIM - Upper Body Dressing/Undressing Upper body dressing/undressing steps patient completed: Thread/unthread right sleeve of pullover shirt/dresss, Thread/unthread left sleeve of pullover shirt/dress, Put head through opening of pull over shirt/dress, Pull shirt over trunk Upper body dressing/undressing: 5: Supervision: Safety issues/verbal cues FIM - Lower Body Dressing/Undressing Lower body dressing/undressing steps patient completed: Thread/unthread right underwear leg, Thread/unthread left underwear leg, Pull underwear up/down, Thread/unthread right pants leg, Thread/unthread left pants leg, Pull pants up/down, Don/Doff right sock, Don/Doff left sock, Don/Doff right shoe, Fasten/unfasten left shoe, Fasten/unfasten right shoe, Don/Doff left shoe Lower body dressing/undressing: 5: Set-up assist to: Obtain clothing  FIM - Toileting Toileting steps completed by patient: Adjust clothing prior to toileting, Performs perineal hygiene, Adjust clothing after toileting Toileting Assistive Devices: Grab bar or rail for support Toileting: 5: Supervision: Safety issues/verbal cues  FIM - Diplomatic Services operational officer Devices: Therapist, music Transfers: 5-To toilet/BSC: Supervision (verbal cues/safety issues), 5-From toilet/BSC: Supervision (verbal cues/safety issues)  FIM - Banker Devices: Arm rests Bed/Chair Transfer: 6: Bed > Chair or W/C: No assist, 6: Sit > Supine: No assist, 6: Supine > Sit: No assist, 6: Chair or W/C > Bed: No assist  FIM - Locomotion: Wheelchair Locomotion: Wheelchair: 0: Activity did not occur FIM - Locomotion: Ambulation Locomotion: Ambulation  Assistive Devices: Other (comment) (none) Ambulation/Gait Assistance: 6: Modified independent (  Device/Increase time) (in controlled environment of room only) Locomotion: Ambulation: 5: Household Independent - travels 50 - 149 ft independent or modified independent  Comprehension Comprehension Mode: Auditory Comprehension: 2-Understands basic 25 - 49% of the time/requires cueing 51 - 75% of the time  Expression Expression Mode: Verbal Expression: 2-Expresses basic 25 - 49% of the time/requires cueing 50 - 75% of the time. Uses single words/gestures.  Social Interaction Social Interaction Mode: Not assessed Social Interaction: 1-Interacts appropriately less than 25% of the time. May be withdrawn or combative.  Problem Solving Problem Solving: 1-Solves basic less than 25% of the time - needs direction nearly all the time or does not effectively solve problems and may need a restraint for safety  Memory Memory: 1-Recognizes or recalls less than 25% of the time/requires cueing greater than 75% of the time  Medical Problem List and Plan: 1. Functional deficits secondary to TBI   2. DVT Prophylaxis/Anticoagulation: Pharmaceutical: Lovenox 3. Pain Management: Will continue oxycodone prn. Will likely need premedication as unable to express needs. Will monitor for now.  4. Mood: Unable to gauge at this time due to cognitive deficits. LCSW to follow along for evaluation and support once more appropriate.  5. Neuropsych: This patient is not capable of making decisions on her own behalf. -continue in vail for safety 6. Skin/Wound Care: pt is mobile 7. Fluids/Electrolytes/Nutrition:megace stoppped .    8. Agitation: still confused,inappropriate,easily agitated  -seroquel  -klonopin and librium stopped due to lethargy  -increased depakote to 750mg  bid   -sleep cycle overall better  -Continues to require [place] bed for fall  prevention and due to poor insight/awareness  -appreciate psych assessment and input--- 9. ?GERD/odynophagia/dysphagia  -PEG placed and functioning  - bolus feeds  -hasn't tried to pull out tube yet 10. RML pneumonia---levaquin--dc'ed  -cxr on 2/18 WITHOUT ACUTE FINDINGS  -afebrile  -has had some leukocytosis--resumed levaquin---continue for a 7 day course then dc  -wbc's improved 11. Bowel and bladder: emptying   LOS (Days) 37 A FACE TO FACE EVALUATION WAS PERFORMED  SWARTZ,ZACHARY T 04/15/2014 8:08 AM

## 2014-04-16 ENCOUNTER — Inpatient Hospital Stay (HOSPITAL_COMMUNITY): Payer: Medicaid Other | Admitting: Speech Pathology
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
## 2014-04-16 NOTE — Progress Notes (Signed)
Speech Language Pathology Weekly Progress and Session Note  Patient Details  Name: Justin Pugh MRN: [id number] Date of Birth: [birth year]  Beginning of progress report period: April 08, 2013 End of progress report period: April 16, 2014  Today's Date: 04/16/2014 SLP Individual Time: 6222-9798 SLP Individual Time Calculation (min): 45 min  Short Term Goals: Week 5: Patient will participate in dysphagia treatment with Mod A multimodal cues: Goal Met     New Short Term Goals: Week 6: SLP Short Term Goal 1 (Week 6): Patient will consume trials of thin liquids with minimal overt s/s of aspiration with Min A multimodal cues for use of swallowing strategies.  SLP Short Term Goal 2 (Week 6): Patient will consume trials of Dys. 1 textures with minimal overt s/s of aspiration with Min A multimodal cues.   Weekly Progress Updates: Patient was seen today for reassessment of swallowing function. Patient remains NPO with PEG, however, patient demonstrates increased cooperation, increased ability to follow directions and decreased agitation.  Patient consumed trials of thin liquids via cup and Dys. 1 textures with minimal overt s/s of aspiration and utilized swallowing compensatory strategies with Min A multimodal cues.  Recommend patient remain NPO but initiate water protocol with close supervision for tolerance. Due to patient's increased ability to participate in sessions and increased swallowing function, patient would benefit from continued skilled SLP intervention for possible initiation of a PO diet.    Intensity: Minumum of 1-2 x/day, 30 to 90 minutes Frequency: 1 to 3 out of 7 days Duration/Length of Stay: TBD due to SNF placement  Treatment/Interventions: Dysphagia/aspiration precaution training;Environmental controls;Functional tasks;Patient/family education;Therapeutic Activities   Daily Session  Skilled Therapeutic Interventions: Skilled treatment session focused on reassessment  of swallowing function.  Upon arrival, patient was pleasant and cooperative with language of confusion while sitting upright in chair. Patient performed oral care via suction toothbrush with supervision verbal cues. Patient consumed trials of thin liquids via cup and Dys. 1 textures. Patient demonstrated what appeared to be a timely swallow initiation and utilized multiple sips with all consistencies.  Patient demonstrated a subtle wet vocal quality X 2 with thin liquids, however, patient independently cleared with a throat clear. Overall, patient appeared to be close to his cognitive baseline consistent with his initial SLP evaluation,therefore, recommend patient remain NPO and initiate the water protocol with close supervision from for tolerance. Patient left with RN in room. Continue with current plan of care.  FIM:  Comprehension Comprehension Mode: Auditory Expression Expression Mode: Verbal Expression: 2-Expresses basic 25 - 49% of the time/requires cueing 50 - 75% of the time. Uses single words/gestures. Social Interaction Social Interaction: 3-Interacts appropriately 50 - 74% of the time - May be physically or verbally inappropriate. Problem Solving Problem Solving: 1-Solves basic less than 25% of the time - needs direction nearly all the time or does not effectively solve problems and may need a restraint for safety Memory Memory: 1-Recognizes or recalls less than 25% of the time/requires cueing greater than 75% of the time FIM - Eating Eating Activity: 5: Supervision/cues Pain No reports of pain   Therapy/Group: Individual Therapy  Yash Cacciola 04/16/2014, 3:37 PM

## 2014-04-17 ENCOUNTER — Inpatient Hospital Stay (HOSPITAL_COMMUNITY): Payer: Medicaid Other | Admitting: *Deleted

## 2014-04-17 NOTE — Progress Notes (Signed)
SLP Cancellation Note  Patient Details Name: Justin Pugh MRN: [id number] DOB: 1955/05/28   Cancelled treatment:       Patient missed 30 minutes of skilled SLP intervention due to declining trials of thin liquids and Dys. 1 textures due to "just eating" (patient had just received tube feed). RN made aware.                                                 [name] 04/17/2014, 3:27 PM

## 2014-04-18 DIAGNOSIS — S062X4S Diffuse traumatic brain injury with loss of consciousness of 6 hours to 24 hours, sequela: Secondary | ICD-10-CM

## 2014-04-18 NOTE — Progress Notes (Signed)
Katrine CohoJeffrey L Bristow is a 59 y.o. male 11-25-1955 [id number]  Subjective: Confused - needing to answer the phone call - and callback. Feeling OK, denies pain. Requests assistance finding the little key behind the curtain  Objective: Vital signs in last 24 hours: Temp:  [97.5 F (36.4 C)-97.6 F (36.4 C)] 97.5 F (36.4 C) (02/27 0629) Pulse Rate:  [85-88] 85 (02/27 0629) Resp:  [18-20] 20 (02/27 0629) BP: (121-128)/(79-87) 128/87 mmHg (02/27 0629) SpO2:  [100 %] 100 % (02/27 0629) Weight:  [81.5 kg (179 lb 10.8 oz)] 81.5 kg (179 lb 10.8 oz) (02/27 0629) Weight change: -3 kg (-6 lb 9.8 oz) Last BM Date: 04/17/14  Intake/Output from previous day: 02/26 0701 - 02/27 0700 In: 1550 [NG/GT:1550] Out: -   Physical Exam General: Mild agitation within vail bed - requesting unzip     Musculoskeletal:  Grossly intact. MAE well and normal strength Neurological: Confused. No new neurological deficits   Lab Results: BMET    Component Value Date/Time   NA 139 04/09/2014 0807   K 4.1 04/09/2014 0807   CL 100 04/09/2014 0807   CO2 30 04/09/2014 0807   GLUCOSE 102* 04/09/2014 0807   BUN 9 04/09/2014 0807   CREATININE 0.73 04/09/2014 0807   CALCIUM 9.6 04/09/2014 0807   GFRNONAA >90 04/09/2014 0807   GFRAA >90 04/09/2014 0807   CBC    Component Value Date/Time   WBC 12.0* 04/13/2014 0720   RBC 4.22 04/13/2014 0720   HGB 13.1 04/13/2014 0720   HCT 39.8 04/13/2014 0720   PLT 346 04/13/2014 0720   MCV 94.3 04/13/2014 0720   MCH 31.0 04/13/2014 0720   MCHC 32.9 04/13/2014 0720   RDW 13.0 04/13/2014 0720   LYMPHSABS 2.1 04/09/2014 0807   MONOABS 1.4* 04/09/2014 0807   EOSABS 0.3 04/09/2014 0807   BASOSABS 0.0 04/09/2014 0807   CBG's (last 3):  No results for input(s): GLUCAP in the last 72 hours. LFT's Lab Results  Component Value Date   ALT 10 04/06/2014   AST 14 04/06/2014   ALKPHOS 57 04/06/2014   BILITOT 0.3 04/06/2014    Studies/Results: No results  found.  Medications:  I have reviewed the patient's current medications. Scheduled Medications: . acetaminophen (TYLENOL) oral liquid 160 mg/5 mL  650 mg Per Tube TID WC & HS  . bacitracin  1 application Topical QID  . bacitracin   Topical BID  . cloNIDine  0.2 mg Transdermal Weekly  . enoxaparin (LOVENOX) injection  40 mg Subcutaneous Q24H  . feeding supplement (JEVITY 1.2 CAL)  425 mL Per Tube QID  . feeding supplement (PRO-STAT SUGAR FREE 64)  30 mL Per Tube Daily  . folic acid  1 mg Per Tube Daily  . free water  150 mL Per Tube QID  . nicotine  21 mg Transdermal Daily  . pantoprazole sodium  40 mg Per Tube BID  . polyethylene glycol  17 g Per Tube TID  . propranolol  20 mg Per Tube TID  . QUEtiapine  200 mg Per Tube BID  . sennosides  15 mL Per Tube BID  . sucralfate  1 g Per Tube TID WC & HS  . 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 Functional deficits secondary to TBI  2. DVT Prophylaxis/Anticoagulation: Pharmaceutical: Lovenox 3. Pain Management: Will continue oxycodone prn. Will likely need premedication as unable to express needs. Will monitor for now.  4. Mood: Unable to gauge at this time due to cognitive deficits. LCSW to follow along for evaluation and support once more appropriate.  5. Neuropsych: This patient is not capable of making decisions  on his own behalf. -continue in vail for safety 6. Skin/Wound Care: pt is mobile 7. Fluids/Electrolytes/Nutrition:megace stoppped . TFs via PEG  8. Agitation: still confused,inappropriate,easily agitated  -seroquel  -klonopin and librium stopped due to lethargy -increased depakote to  bid  -sleep cycle overall better  -Continues to require vail bed for fall prevention and due to poor insight/awareness -appreciate psych assessment and input--- 9. ?GERD/odynophagia/dysphagia -PEG placed and functioning - bolus feeds -hasn't tried to pull out tube yet  Length of stay, days: 40    Valerie A. Felicity Coyer, MD 04/18/2014, 10:07 AM

## 2014-04-19 NOTE — Progress Notes (Signed)
Justin Pugh is a 59 y.o. male 11-19-1955 [id number]  Subjective: Awake, active but less agitated than yesterday. Denies pain.   Objective: Vital signs in last 24 hours: Temp:  [97.9 F (36.6 C)] 97.9 F (36.6 C) (02/28 0602) Pulse Rate:  [70-81] 70 (02/28 0602) Resp:  [20] 20 (02/28 0602) BP: (125-137)/(77-100) 125/77 mmHg (02/28 0602) SpO2:  [98 %] 98 % (02/28 0602) Weight:  [80.9 kg (178 lb 5.6 oz)] 80.9 kg (178 lb 5.6 oz) (02/28 0602) Weight change: -0.6 kg (-1 lb 5.2 oz) Last BM Date: 04/18/14  Intake/Output from previous day:    Physical Exam General: awake and sitting up within vail bed - tangential conversation     Musculoskeletal:  Grossly intact. MAE well and normal strength Neurological: Confused. No new neurological deficits   Lab Results: BMET    Component Value Date/Time   NA 139 04/09/2014 0807   K 4.1 04/09/2014 0807   CL 100 04/09/2014 0807   CO2 30 04/09/2014 0807   GLUCOSE 102* 04/09/2014 0807   BUN 9 04/09/2014 0807   CREATININE 0.73 04/09/2014 0807   CALCIUM 9.6 04/09/2014 0807   GFRNONAA >90 04/09/2014 0807   GFRAA >90 04/09/2014 0807   CBC    Component Value Date/Time   WBC 12.0* 04/13/2014 0720   RBC 4.22 04/13/2014 0720   HGB 13.1 04/13/2014 0720   HCT 39.8 04/13/2014 0720   PLT 346 04/13/2014 0720   MCV 94.3 04/13/2014 0720   MCH 31.0 04/13/2014 0720   MCHC 32.9 04/13/2014 0720   RDW 13.0 04/13/2014 0720   LYMPHSABS 2.1 04/09/2014 0807   MONOABS 1.4* 04/09/2014 0807   EOSABS 0.3 04/09/2014 0807   BASOSABS 0.0 04/09/2014 0807   CBG's (last 3):  No results for input(s): GLUCAP in the last 72 hours. LFT's Lab Results  Component Value Date   ALT 10 04/06/2014   AST 14 04/06/2014   ALKPHOS 57 04/06/2014   BILITOT 0.3 04/06/2014    Studies/Results: No results found.  Medications:  I have reviewed the patient's current medications. Scheduled Medications: . acetaminophen (TYLENOL) oral liquid 160 mg/5 mL  650 mg Per Tube  TID WC & HS  . bacitracin  1 application Topical QID  . bacitracin   Topical BID  . cloNIDine  0.2 mg Transdermal Weekly  . enoxaparin (LOVENOX) injection  40 mg Subcutaneous Q24H  . feeding supplement (JEVITY 1.2 CAL)  425 mL Per Tube QID  . feeding supplement (PRO-STAT SUGAR FREE 64)  30 mL Per Tube Daily  . folic acid  1 mg Per Tube Daily  . free water  150 mL Per Tube QID  . nicotine  21 mg Transdermal Daily  . pantoprazole sodium  40 mg Per Tube BID  . polyethylene glycol  17 g Per Tube TID  . propranolol  20 mg Per Tube TID  . QUEtiapine  200 mg Per Tube BID  . sennosides  15 mL Per Tube BID  . sucralfate  1 g Per Tube TID WC & HS  . 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 Functional deficits secondary to TBI  2. DVT Prophylaxis/Anticoagulation: Pharmaceutical: Lovenox 3. Pain Management: Will continue oxycodone prn. Will likely need premedication as unable to express needs. Will monitor for now.  4. Mood: Unable to gauge at this time due to cognitive deficits. LCSW to follow along for evaluation and support once more appropriate.  5. Neuropsych: This patient is not capable of making decisions on his own behalf. -continue in vail for safety 6. Skin/Wound Care: pt is mobile 7. Fluids/Electrolytes/Nutrition:megace stoppped . TFs via PEG  8.  Agitation: still confused, inappropriate, easily agitated  -seroquel  -klonopin and librium stopped due to lethargy -increased depakote to 750mg  bid  -sleep cycle overall better  -Continues to require vail bed for fall prevention and due to poor insight/awareness -appreciate psych assessment and input--- 9. ?GERD/odynophagia/dysphagia -PEG placed and functioning - bolus feeds -hasn't tried to pull out tube yet  Length of stay, days: 41    Lailie Smead A. Felicity CoyerLeschber, MD 04/19/2014, 8:44 AM
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
## 2014-04-20 ENCOUNTER — Inpatient Hospital Stay (HOSPITAL_COMMUNITY): Payer: Medicaid Other | Admitting: Speech Pathology

## 2014-04-20 NOTE — Progress Notes (Signed)
[facility]ICINE & REHABILITATION     PROGRESS NOTE    Subjective/Complaints: Perhaps calmer overall. Still needs frequent redirection. ROS limited due to cognition  Objective: Vital Signs: Blood pressure 135/71, pulse 72, temperature 97.9 F (36.6 C), temperature source Oral, resp. rate 19, weight 81.3 kg (179 lb 3.7 oz), SpO2 99 %. No results found. No results for input(s): WBC, HGB, HCT, PLT in the last 72 hours. No results for input(s): NA, K, CL, GLUCOSE, BUN, CREATININE, CALCIUM in the last 72 hours.  Invalid input(s): CO CBG (last 3)  No results for input(s): GLUCAP in the last 72 hours.  Wt Readings from Last 3 Encounters:  04/20/14 81.3 kg (179 lb 3.7 oz)  03/09/14 84.188 kg (185 lb 9.6 oz)  01/23/14 95.255 kg (210 lb)    Physical Exam:  Constitutional: He appears well-developed and well-nourished. He is sleeping. He is easily aroused.  HENT:  Head: Normocephalic.  Neck: supple Cardiovascular: Normal rate and regular rhythm.  Respiratory: Effort normal. No respiratory distress.  GI: Soft. Bowel sounds are normal. He exhibits no distension. There is mild abdominal tenderness, peg site a little red Musculoskeletal: He exhibits no edema.  Neurological: He is alert today.  Phonation improved. A little hoarse  restless.   Moves all extremities without difficulty. equally moves all 4's. Senses pain in all 4. Oriented to name only. confabulates Skin: Skin is warm and dry.  Psychiatric:  Confused   Assessment/Plan: 1. 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 Physiatrist is providing close team supervision and 24 hour management of active medical problems listed below. Physiatrist and rehab team continue to assess barriers to discharge/monitor patient progress toward functional and medical goals.  Therapies dc'ed. Pt does not need assistance to  ambulate.    FIM: FIM - Bathing Bathing Steps Patient Completed: Chest, Abdomen, Front perineal area, Right Arm, Left Arm, Buttocks, Right upper leg, Left upper leg, Left lower leg (including foot), Right lower leg (including foot) Bathing: 5: Supervision: Safety issues/verbal cues  FIM - Upper Body Dressing/Undressing Upper body dressing/undressing steps patient completed: Thread/unthread right sleeve of pullover shirt/dresss, Thread/unthread left sleeve of pullover shirt/dress, Put head through opening of pull over shirt/dress, Pull shirt over trunk Upper body dressing/undressing: 5: Supervision: Safety issues/verbal cues FIM - Lower Body Dressing/Undressing Lower body dressing/undressing steps patient completed: Thread/unthread right underwear leg, Thread/unthread left underwear leg, Pull underwear up/down, Thread/unthread right pants leg, Thread/unthread left pants leg, Pull pants up/down, Don/Doff right sock, Don/Doff left sock, Don/Doff right shoe, Fasten/unfasten left shoe, Fasten/unfasten right shoe, Don/Doff left shoe Lower body dressing/undressing: 5: Set-up assist to: Obtain clothing  FIM - Toileting Toileting steps completed by patient: Adjust clothing prior to toileting, Performs perineal hygiene, Adjust clothing after toileting Toileting Assistive Devices: Grab bar or rail for support Toileting: 5: Supervision: Safety issues/verbal cues  FIM - Diplomatic Services operational officer Devices: Therapist, music Transfers: 5-To toilet/BSC: Supervision (verbal cues/safety issues), 5-From toilet/BSC: Supervision (verbal cues/safety issues)  FIM - Banker Devices: Arm rests Bed/Chair Transfer: 6: Bed > Chair or W/C: No assist, 6: Sit > Supine: No assist, 6: Supine > Sit: No assist, 6: Chair or W/C > Bed: No assist  FIM - Locomotion: Wheelchair Locomotion: Wheelchair: 0: Activity did not occur FIM - Locomotion: Ambulation Locomotion:  Ambulation Assistive Devices: Other (comment) (none) Ambulation/Gait Assistance: 6: Modified independent (Device/Increase time) (in controlled environment of  room only) Locomotion: Ambulation: 5: Household Independent - travels 50 - 149 ft independent or modified independent  Comprehension Comprehension Mode: Auditory Comprehension: 2-Understands basic 25 - 49% of the time/requires cueing 51 - 75% of the time  Expression Expression Mode: Verbal Expression: 2-Expresses basic 25 - 49% of the time/requires cueing 50 - 75% of the time. Uses single words/gestures.  Social Interaction Social Interaction Mode: Not assessed Social Interaction: 3-Interacts appropriately 50 - 74% of the time - May be physically or verbally inappropriate.  Problem Solving Problem Solving: 1-Solves basic less than 25% of the time - needs direction nearly all the time or does not effectively solve problems and may need a restraint for safety  Memory Memory: 1-Recognizes or recalls less than 25% of the time/requires cueing greater than 75% of the time  Medical Problem List and Plan: 1. Functional deficits secondary to TBI   2. DVT Prophylaxis/Anticoagulation: Pharmaceutical: Lovenox 3. Pain Management: Will continue oxycodone prn. Will likely need premedication as unable to express needs. Will monitor for now.  4. Mood: Unable to gauge at this time due to cognitive deficits. LCSW to follow along for evaluation and support once more appropriate.  5. Neuropsych: This patient is not capable of making decisions on her own behalf. -continue in vail for safety 6. Skin/Wound Care: pt is mobile 7. Fluids/Electrolytes/Nutrition:megace stoppped .    8. Agitation: still confused,inappropriate,easily agitated  -seroquel  -klonopin and librium stopped due to lethargy  -increased depakote to 750mg  bid   -sleep cycle overall better  -Continues to require vail  bed for fall prevention and due to poor insight/awareness--try to liberate from vail bed  -appreciate psych assessment and input--- 9. ?GERD/odynophagia/dysphagia--remains npo  -PEG placed and functioning  - bolus feeds  -hasn't tried to pull out tube yet 10. RML pneumonia---levaquin--dc'ed  -cxr on 2/18 WITHOUT ACUTE FINDINGS  -afebrile  -has had some leukocytosis---  -wbc's improved  -off levaquin currently 11. Bowel and bladder: emptying   LOS (Days) 42 A FACE TO FACE EVALUATION WAS PERFORMED  Westlee Devita T 04/20/2014 8:39 AM

## 2014-04-20 NOTE — Progress Notes (Signed)
Speech Language Pathology Daily Session Note  Patient Details  Name: Justin Pugh MRN: [id number] Date of Birth: [birth year]  Today's Date: 04/20/2014 SLP Individual Time: 1400-1430 SLP Individual Time Calculation (min): 30 min  Short Term Goals: Week 6: SLP Short Term Goal 1 (Week 6): Patient will consume trials of thin liquids with minimal overt s/s of aspiration with Min A multimodal cues for use of swallowing strategies.  SLP Short Term Goal 2 (Week 6): Patient will consume trials of Dys. 1 textures with minimal overt s/s of aspiration with Min A multimodal cues.   Skilled Therapeutic Interventions: Skilled treatment session focused on dysphagia goals. Upon arrival, patient was sitting upright in a chair consuming thin liquids with RN present. Patient consumed thin liquids via cup and Dys. 1 textures without overt s/s of aspiration. Patient was hyper-verbal throughout the session with language of confusion and confabulation but was cooperative throughout the session. Patient returned to enclosure bed at end of session. Continue with current plan of care.    FIM:  Comprehension Comprehension Mode: Auditory Comprehension: 2-Understands basic 25 - 49% of the time/requires cueing 51 - 75% of the time Expression Expression Mode: Verbal Expression: 2-Expresses basic 25 - 49% of the time/requires cueing 50 - 75% of the time. Uses single words/gestures. Social Interaction Social Interaction: 3-Interacts appropriately 50 - 74% of the time - May be physically or verbally inappropriate. Problem Solving Problem Solving: 1-Solves basic less than 25% of the time - needs direction nearly all the time or does not effectively solve problems and may need a restraint for safety Memory Memory: 1-Recognizes or recalls less than 25% of the time/requires cueing greater than 75% of the time FIM - Eating Eating Activity: 5: Supervision/cues  Pain Pain Assessment Pain Assessment: No/denies  pain  Therapy/Group: Individual Therapy  Remmie Bembenek 04/20/2014, 3:16 PM

## 2014-04-21 ENCOUNTER — Inpatient Hospital Stay (HOSPITAL_COMMUNITY): Payer: Medicaid Other | Admitting: Speech Pathology

## 2014-04-21 LAB — VALPROIC ACID LEVEL: Valproic Acid Lvl: 51.6 ug/mL (ref 50.0–100.0)

## 2014-04-21 LAB — CBC
HEMATOCRIT: 36.4 % — AB (ref 39.0–52.0)
HEMOGLOBIN: 12.2 g/dL — AB (ref 13.0–17.0)
MCH: 31.5 pg (ref 26.0–34.0)
MCHC: 33.5 g/dL (ref 30.0–36.0)
MCV: 94.1 fL (ref 78.0–100.0)
Platelets: 319 10*3/uL (ref 150–400)
RBC: 3.87 MIL/uL — AB (ref 4.22–5.81)
RDW: 13.3 % (ref 11.5–15.5)
WBC: 8.3 10*3/uL (ref 4.0–10.5)

## 2014-04-21 LAB — COMPREHENSIVE METABOLIC PANEL
ALBUMIN: 2.7 g/dL — AB (ref 3.5–5.2)
ALT: 13 U/L (ref 0–53)
AST: 16 U/L (ref 0–37)
Alkaline Phosphatase: 49 U/L (ref 39–117)
Anion gap: 9 (ref 5–15)
BUN: 8 mg/dL (ref 6–23)
CO2: 29 mmol/L (ref 19–32)
CREATININE: 0.64 mg/dL (ref 0.50–1.35)
Calcium: 8.8 mg/dL (ref 8.4–10.5)
Chloride: 100 mmol/L (ref 96–112)
GFR calc Af Amer: >90 mL/min (ref >90)
Glucose, Bld: 87 mg/dL (ref 70–99)
Potassium: 3.9 mmol/L (ref 3.5–5.1)
Sodium: 138 mmol/L (ref 135–145)
TOTAL PROTEIN: 6.6 g/dL (ref 6.0–8.3)
Total Bilirubin: 0.2 mg/dL — ABNORMAL LOW (ref 0.3–1.2)

## 2014-04-21 MED ORDER — VALPROIC ACID 250 MG/5ML PO SYRP
1000.0000 mg | ORAL_SOLUTION | Freq: Two times a day (BID) | ORAL | Status: DC
Start: 2014-04-21 — End: 2014-04-28
  Administered 2014-04-21 – 2014-04-28 (×14): 1000 mg
  Filled 2014-04-21 (×16): qty 20
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
## 2014-04-21 NOTE — Progress Notes (Signed)
Speech Language Pathology Daily Session Note  Patient Details  Name: Katrine CohoJeffrey L Sprague MRN: [id number] Date of Birth: 1955/03/31  Today's Date: 04/21/2014 SLP Individual Time: 1100-1130 SLP Individual Time Calculation (min): 30 min  Short Term Goals: Week 6: SLP Short Term Goal 1 (Week 6): Patient will consume trials of thin liquids with minimal overt s/s of aspiration with Min A multimodal cues for use of swallowing strategies.  SLP Short Term Goal 2 (Week 6): Patient will consume trials of Dys. 1 textures with minimal overt s/s of aspiration with Min A multimodal cues.   Skilled Therapeutic Interventions: Skilled treatment session focused on dysphagia goals. SLP facilitated session by providing skilled observation with trials of Dys. 1 textures and thin liquids via cup. Patient demonstrated a timely swallow initiation and consumed all trials without overt s/s of aspiration, therefore, recommend patient initiate a diet of Dys. 1 textures with thin liquids via cup on 3/2 with full supervision.  Patient was calm and cooperative throughout session and demonstrated language of confusion. Patient was oriented to hospital but required total A for orientation to situation.  Patient also demonstrated intermittent and minimal awareness in regards to his current situation and verbalized he "needs to change how I act." Due to patient's increased cooperation and ability to safely participate in treatment session, recommend possible re-initiation of therapies, will consult with physician and team members. Patient returned to enclosure bed and left with all needs within reach. Continue with current plan of care.    FIM:  Comprehension Comprehension Mode: Auditory Comprehension: 3-Understands basic 50 - 74% of the time/requires cueing 25 - 50%  of the time Expression Expression Mode: Verbal Expression: 3-Expresses basic 50 - 74% of the time/requires cueing 25 - 50% of the time. Needs to repeat parts of  sentences. Social Interaction Social Interaction: 3-Interacts appropriately 50 - 74% of the time - May be physically or verbally inappropriate. Problem Solving Problem Solving: 1-Solves basic less than 25% of the time - needs direction nearly all the time or does not effectively solve problems and may need a restraint for safety Memory Memory: 1-Recognizes or recalls less than 25% of the time/requires cueing greater than 75% of the time FIM - Eating Eating Activity: 5: Supervision/cues  Pain Pain Assessment Pain Assessment: No/denies pain  Therapy/Group: Individual Therapy  Shreyansh Tiffany 04/21/2014, 1:22 PM

## 2014-04-21 NOTE — Progress Notes (Signed)
[place] MEDICINE & REHABILITATION     PROGRESS NOTE    Subjective/Complaints: Confabulatory, redirectable. Non-agitated ROS limited due to cognition  Objective: Vital Signs: Blood pressure 115/73, pulse 74, temperature 98.5 F (36.9 C), temperature source Oral, resp. rate 18, weight 83 kg (182 lb 15.7 oz), SpO2 99 %. No results found.  Recent Labs  04/21/14 0532  WBC 8.3  HGB 12.2*  HCT 36.4*  PLT 319    Recent Labs  04/21/14 0532  NA 138  K 3.9  CL 100  GLUCOSE 87  BUN 8  CREATININE 0.64  CALCIUM 8.8   CBG (last 3)  No results for input(s): GLUCAP in the last 72 hours.  Wt Readings from Last 3 Encounters:  04/21/14 83 kg (182 lb 15.7 oz)  03/09/14 84.188 kg (185 lb 9.6 oz)  01/23/14 95.255 kg (210 lb)    Physical Exam:  Constitutional: He appears well-developed and well-nourished. He is sleeping. He is easily aroused.  HENT:  Head: Normocephalic.  Neck: supple Cardiovascular: Normal rate and regular rhythm.  Respiratory: Effort normal. No respiratory distress.  GI: Soft. Bowel sounds are normal. He exhibits no distension. There is mild abdominal tenderness, peg site a little red Musculoskeletal: He exhibits no edema.  Neurological: He is alert today.  Phonation improved. A little hoarse  restless.   Moves all extremities without difficulty. equally moves all 4's. Senses pain in all 4. Oriented to name only. confabulates Skin: Skin is warm and dry.  Psychiatric:  Confused   Assessment/Plan: 1. 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 Physiatrist is providing close team supervision and 24 hour management of active medical problems listed below. Physiatrist and rehab team continue to assess barriers to discharge/monitor patient progress toward functional and medical goals.  Therapies dc'ed. Pt does not need assistance to ambulate.     FIM: FIM - Bathing Bathing Steps Patient Completed: Chest, Abdomen, Front perineal area, Right Arm, Left Arm, Buttocks, Right upper leg, Left upper leg, Left lower leg (including foot), Right lower leg (including foot) Bathing: 5: Supervision: Safety issues/verbal cues  FIM - Upper Body Dressing/Undressing Upper body dressing/undressing steps patient completed: Thread/unthread right sleeve of pullover shirt/dresss, Thread/unthread left sleeve of pullover shirt/dress, Put head through opening of pull over shirt/dress, Pull shirt over trunk Upper body dressing/undressing: 5: Supervision: Safety issues/verbal cues FIM - Lower Body Dressing/Undressing Lower body dressing/undressing steps patient completed: Thread/unthread right underwear leg, Thread/unthread left underwear leg, Pull underwear up/down, Thread/unthread right pants leg, Thread/unthread left pants leg, Pull pants up/down, Don/Doff right sock, Don/Doff left sock, Don/Doff right shoe, Fasten/unfasten left shoe, Fasten/unfasten right shoe, Don/Doff left shoe Lower body dressing/undressing: 5: Set-up assist to: Obtain clothing  FIM - Toileting Toileting steps completed by patient: Adjust clothing prior to toileting, Performs perineal hygiene, Adjust clothing after toileting Toileting Assistive Devices: Grab bar or rail for support Toileting: 5: Supervision: Safety issues/verbal cues  FIM - Diplomatic Services operational officer Devices: Therapist, music Transfers: 5-To toilet/BSC: Supervision (verbal cues/safety issues), 5-From toilet/BSC: Supervision (verbal cues/safety issues)  FIM - Banker Devices: Arm rests Bed/Chair Transfer: 6: Bed > Chair or W/C: No assist, 6: Sit > Supine: No assist, 6: Supine > Sit: No assist, 6: Chair or W/C > Bed: No assist  FIM - Locomotion: Wheelchair Locomotion: Wheelchair: 0: Activity did not occur FIM - Locomotion: Ambulation Locomotion: Ambulation  Assistive Devices: Other (comment) (  none) Ambulation/Gait Assistance: 6: Modified independent (Device/Increase time) (in controlled environment of room only) Locomotion: Ambulation: 5: Household Independent - travels 50 - 149 ft independent or modified independent  Comprehension Comprehension Mode: Auditory Comprehension: 2-Understands basic 25 - 49% of the time/requires cueing 51 - 75% of the time  Expression Expression Mode: Verbal Expression: 2-Expresses basic 25 - 49% of the time/requires cueing 50 - 75% of the time. Uses single words/gestures.  Social Interaction Social Interaction Mode: Not assessed Social Interaction: 3-Interacts appropriately 50 - 74% of the time - May be physically or verbally inappropriate.  Problem Solving Problem Solving: 1-Solves basic less than 25% of the time - needs direction nearly all the time or does not effectively solve problems and may need a restraint for safety  Memory Memory: 1-Recognizes or recalls less than 25% of the time/requires cueing greater than 75% of the time  Medical Problem List and Plan: 1. Functional deficits secondary to TBI   2. DVT Prophylaxis/Anticoagulation: Pharmaceutical: Lovenox 3. Pain Management: Will continue oxycodone prn. Will likely need premedication as unable to express needs. Will monitor for now.  4. Mood: Unable to gauge at this time due to cognitive deficits. LCSW to follow along for evaluation and support once more appropriate.  5. Neuropsych: This patient is not capable of making decisions on her own behalf. -continue in vail for safety 6. Skin/Wound Care: pt is mobile 7. Fluids/Electrolytes/Nutrition:megace stoppped .    8. Agitation: confused but less agitated.   -seroquel  -klonopin and librium stopped due to lethargy  -increased depakote to 750mg  bid---level 51.6---increase to 1000mg  bid  -sleep cycle overall better  -Unzip vail bed  during the day  -appreciate psych assessment and input--- 9. ?GERD/odynophagia/dysphagia--advance diet per SLP  -PEG placed and functioning  - bolus feeds  -hasn't tried to pull out tube yet 10. RML pneumonia---levaquin--dc'ed  -cxr on 2/18 WITHOUT ACUTE FINDINGS  -afebrile-  -wbc's improved  -off levaquin currently 11. Bowel and bladder: emptying   LOS (Days) 43 A FACE TO FACE EVALUATION WAS PERFORMED  SWARTZ,ZACHARY T 04/21/2014 7:57 AM

## 2014-04-22 ENCOUNTER — Inpatient Hospital Stay (HOSPITAL_COMMUNITY): Payer: Self-pay | Admitting: *Deleted

## 2014-04-22 ENCOUNTER — Inpatient Hospital Stay (HOSPITAL_COMMUNITY): Payer: Self-pay

## 2014-04-22 ENCOUNTER — Inpatient Hospital Stay (HOSPITAL_COMMUNITY): Payer: Medicaid Other | Admitting: Speech Pathology
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
## 2014-04-22 NOTE — Progress Notes (Signed)
[place] PHYSICAL MEDICINE & REHABILITATION     PROGRESS NOTE    Subjective/Complaints: Confabulatory, redirectable. Non-agitated--no change ROS limited due to cognition  Objective: Vital Signs: Blood pressure 131/79, pulse 66, temperature 98 F (36.7 C), temperature source Oral, resp. rate 18, weight 83 kg (182 lb 15.7 oz), SpO2 100 %. No results found.  Recent Labs  04/21/14 0532  WBC 8.3  HGB 12.2*  HCT 36.4*  PLT 319    Recent Labs  04/21/14 0532  NA 138  K 3.9  CL 100  GLUCOSE 87  BUN 8  CREATININE 0.64  CALCIUM 8.8   CBG (last 3)  No results for input(s): GLUCAP in the last 72 hours.  Wt Readings from Last 3 Encounters:  04/21/14 83 kg (182 lb 15.7 oz)  03/09/14 84.188 kg (185 lb 9.6 oz)  01/23/14 95.255 kg (210 lb)    Physical Exam:  Constitutional: He appears well-developed and well-nourished. He is sleeping. He is easily aroused.  HENT:  Head: Normocephalic.  Neck: supple Cardiovascular: Normal rate and regular rhythm.  Respiratory: Effort normal. No respiratory distress.  GI: Soft. Bowel sounds are normal. He exhibits no distension. There is mild abdominal tenderness, peg site a little red Musculoskeletal: He exhibits no edema.  Neurological: He is alert today.  Phonation improved. A little hoarse  restless.   Moves all extremities without difficulty. equally moves all 4's. Senses pain in all 4. Oriented to name only. confabulates Skin: Skin is warm and dry.  Psychiatric:  Confused   Assessment/Plan: 1. 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 Physiatrist is providing close team supervision and 24 hour management of active medical problems listed below. Physiatrist and rehab team continue to assess barriers to discharge/monitor patient progress toward functional and medical goals.  Therapies dc'ed. Pt does not need assistance to  ambulate.    FIM: FIM - Bathing Bathing Steps Patient Completed: Chest, Abdomen, Front perineal area, Right Arm, Left Arm, Buttocks, Right upper leg, Left upper leg, Left lower leg (including foot), Right lower leg (including foot) Bathing: 5: Supervision: Safety issues/verbal cues  FIM - Upper Body Dressing/Undressing Upper body dressing/undressing steps patient completed: Thread/unthread right sleeve of pullover shirt/dresss, Thread/unthread left sleeve of pullover shirt/dress, Put head through opening of pull over shirt/dress, Pull shirt over trunk Upper body dressing/undressing: 5: Supervision: Safety issues/verbal cues FIM - Lower Body Dressing/Undressing Lower body dressing/undressing steps patient completed: Thread/unthread right underwear leg, Thread/unthread left underwear leg, Pull underwear up/down, Thread/unthread right pants leg, Thread/unthread left pants leg, Pull pants up/down, Don/Doff right sock, Don/Doff left sock, Don/Doff right shoe, Fasten/unfasten left shoe, Fasten/unfasten right shoe, Don/Doff left shoe Lower body dressing/undressing: 5: Set-up assist to: Obtain clothing  FIM - Toileting Toileting steps completed by patient: Adjust clothing prior to toileting, Performs perineal hygiene, Adjust clothing after toileting Toileting Assistive Devices: Grab bar or rail for support Toileting: 5: Supervision: Safety issues/verbal cues  FIM - Diplomatic Services operational officerToilet Transfers Toilet Transfers Assistive Devices: Therapist, musicGrab bars Toilet Transfers: 5-To toilet/BSC: Supervision (verbal cues/safety issues), 5-From toilet/BSC: Supervision (verbal cues/safety issues)  FIM - BankerBed/Chair Transfer Bed/Chair Transfer Assistive Devices: Arm rests Bed/Chair Transfer: 6: Bed > Chair or W/C: No assist, 6: Sit > Supine: No assist, 6: Supine > Sit: No assist, 6: Chair or W/C > Bed: No assist  FIM - Locomotion: Wheelchair Locomotion: Wheelchair: 0: Activity did not occur FIM - Locomotion: Ambulation Locomotion:  Ambulation Assistive Devices: Other (  comment) (none) Ambulation/Gait Assistance: 6: Modified independent (Device/Increase time) (in controlled environment of room only) Locomotion: Ambulation: 5: Household Independent - travels 50 - 149 ft independent or modified independent  Comprehension Comprehension Mode: Auditory Comprehension: 3-Understands basic 50 - 74% of the time/requires cueing 25 - 50%  of the time  Expression Expression Mode: Verbal Expression: 3-Expresses basic 50 - 74% of the time/requires cueing 25 - 50% of the time. Needs to repeat parts of sentences.  Social Interaction Social Interaction Mode: Not assessed Social Interaction: 3-Interacts appropriately 50 - 74% of the time - May be physically or verbally inappropriate.  Problem Solving Problem Solving: 1-Solves basic less than 25% of the time - needs direction nearly all the time or does not effectively solve problems and may need a restraint for safety  Memory Memory: 1-Recognizes or recalls less than 25% of the time/requires cueing greater than 75% of the time  Medical Problem List and Plan: 1. Functional deficits secondary to TBI   2. DVT Prophylaxis/Anticoagulation: Pharmaceutical: Lovenox 3. Pain Management: Will continue oxycodone prn. Will likely need premedication as unable to express needs. Will monitor for now.  4. Mood: Unable to gauge at this time due to cognitive deficits. LCSW to follow along for evaluation and support once more appropriate.  5. Neuropsych: This patient is not capable of making decisions on her own behalf. -continue in vail for safety 6. Skin/Wound Care: pt is mobile 7. Fluids/Electrolytes/Nutrition:megace stoppped .    8. Agitation: confused but less agitated.   -seroquel  -klonopin and librium stopped due to lethargy  -increased depakote to  bid---re-check level later in week or early next -sleep cycle overall better   -Unzipping vail bed during the day  -appreciate psych assessment and input--- 9. ?GERD/odynophagia/dysphagia--D1 diet per SLP  -PEG placed and functioning  - bolus feeds  -hasn't tried to pull out tube yet 10. RML pneumonia---levaquin--dc'ed  -cxr on 2/18 WITHOUT ACUTE FINDINGS  -afebrile-  -wbc's improved  -off levaquin currently 11. Bowel and bladder: emptying   LOS (Days) 44 A FACE TO FACE EVALUATION WAS PERFORMED  Darl Kuss T 04/22/2014 9:00 AM
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
## 2014-04-22 NOTE — Progress Notes (Addendum)
Speech Language Pathology Daily Session Note  Patient Details  Name: Justin CohoJeffrey L Pugh MRN: [id number] Date of Birth: 1955/12/04  Today's Date: 04/22/2014 SLP Individual Time: 1135-1210 SLP Individual Time Calculation (min): 35 min  Short Term Goals: Week 6: SLP Short Term Goal 1 (Week 6): Patient will consume trials of thin liquids with minimal overt s/s of aspiration with Min A multimodal cues for use of swallowing strategies.  SLP Short Term Goal 2 (Week 6): Patient will consume trials of Dys. 1 textures with minimal overt s/s of aspiration with Min A multimodal cues.   Skilled Therapeutic Interventions: Skilled treatment session focused on dysphagia goals. Upon arrival, patient sitting upright in chair with RN present. Patient had his personal belongings "packed" and was perseverative on "getting out of here" and going to his mother's house. Patient declined lunch meal of Dys. 1 textures with thin liquids despite Max encouragement and multiple attempts. Patient hyperverbal with confabulation and language of confusion and declined to return to enclosure bed. This clinician attempted de-escalation techniques in order to calm patient and increase cooperation, however, security was eventually called for assistance with returning patient to enclosure bed.  Patient verbalized frustration throughout the session but did not demonstrate any physical agitation. Continue with current plan of care.    FIM:  Comprehension Comprehension Mode: Auditory Comprehension: 2-Understands basic 25 - 49% of the time/requires cueing 51 - 75% of the time Expression Expression Mode: Verbal Expression: 2-Expresses basic 25 - 49% of the time/requires cueing 50 - 75% of the time. Uses single words/gestures. Social Interaction Social Interaction: 2-Interacts appropriately 25 - 49% of time - Needs frequent redirection. Problem Solving Problem Solving: 1-Solves basic less than 25% of the time - needs direction nearly all  the time or does not effectively solve problems and may need a restraint for safety Memory Memory: 1-Recognizes or recalls less than 25% of the time/requires cueing greater than 75% of the time  Pain Pain Assessment Pain Assessment: No/denies pain  Therapy/Group: Individual Therapy  Timera Windt 04/22/2014, 4:09 PM

## 2014-04-23 ENCOUNTER — Inpatient Hospital Stay (HOSPITAL_COMMUNITY): Payer: Medicaid Other

## 2014-04-23 ENCOUNTER — Inpatient Hospital Stay (HOSPITAL_COMMUNITY): Payer: Self-pay

## 2014-04-23 ENCOUNTER — Inpatient Hospital Stay (HOSPITAL_COMMUNITY): Payer: Medicaid Other | Admitting: Speech Pathology

## 2014-04-23 NOTE — Progress Notes (Signed)
Speech Language Pathology Weekly Progress and Session Note  Patient Details  Name: Justin Pugh MRN: [id number] Date of Birth: 01-14-1956  Beginning of progress report period: April 16, 2014 End of progress report period: April 23, 2014  Today's Date: 04/23/2014 SLP Individual Time: 6712-4580 SLP Individual Time Calculation (min): 30 min  Short Term Goals: Week 6: SLP Short Term Goal 1 (Week 6): Patient will consume trials of thin liquids with minimal overt s/s of aspiration with Min A multimodal cues for use of swallowing strategies.  SLP Short Term Goal 1 - Progress (Week 6): Met SLP Short Term Goal 2 (Week 6): Patient will consume trials of Dys. 1 textures with minimal overt s/s of aspiration with Min A multimodal cues.  SLP Short Term Goal 2 - Progress (Week 6): Met    New Short Term Goals: Week 7: SLP Short Term Goal 1 (Week 7): Patient will orient to time, place and situation with Max A multimodal cues.  SLP Short Term Goal 2 (Week 7): Patient will demonstrate sustained attention to functional task for 2 minutes with Max A multimodal cues.  SLP Short Term Goal 3 (Week 7): Patient will demonstrate efficient mastication with trials of Dys. 2  textures with minimal overt s/s of aspiration with Min A multimodal cues. SLP Short Term Goal 4 (Week 7): Patient will consume trials of thin liquids with minimal overt s/s of aspiration with supervision multimodal cues for use of swallowing strategies.   Weekly Progress Updates: Patient has made functional gains and has met 2 of 2 STG's this reporting period due to increased swallowing function. Currently, patient is consuming Dys. 1 textures with thin liquids without overt s/s of aspiration and utilizes small bites and a slow rate of self-feeding with Min A multimodal cues.  Patient demonstrates increased ability to participate in treatment session safely and appropriately, therefore, skilled SLP intervention will now include cognitive goals  with focus on orientation and attention. Overall, patient demonstrates behaviors consistent with a Rancho Level IV-V.  Patient would benefit from continued skilled SLP intervention to maximize cognitive and swallowing function in order to maximize his functional independence and reduce caregiver burden.    Intensity: Minumum of 1-2 x/day, 30 to 90 minutes Frequency: 3 to 5 out of 7 days Duration/Length of Stay: TBD due to SNF placement  Treatment/Interventions: Dysphagia/aspiration precaution training;Environmental controls;Functional tasks;Patient/family education;Therapeutic Activities;Cueing hierarchy;Cognitive remediation/compensation;Internal/external aids   Daily Session  Skilled Therapeutic Interventions: Skilled treatment session focused on dysphagia goals. SLP facilitated session by providing supervision multimodal cues for use of small bites with lunch meal of Dys. 1 textures with thin liquids. Patient consumed ~90% of meal without overt s/s of aspiration, therefore, recommend patient continue current diet. Patient was independently oriented to the month but required total A for orientation to place and situation. Patient also demonstrated decreased language of confusion with intermittent recall of events from previous therapy sessions. Patient was calm and cooperative throughout the session and demonstrated selective attention to meal in a mildly distracting environment for 30 minutes with Mod A multimodal cues. Patient left with sitter in recliner and quick release belt in place. Continue with current plan of care.     FIM:  Comprehension Comprehension Mode: Auditory Comprehension: 3-Understands basic 50 - 74% of the time/requires cueing 25 - 50%  of the time Expression Expression Mode: Verbal Expression: 3-Expresses basic 50 - 74% of the time/requires cueing 25 - 50% of the time. Needs to repeat parts of sentences. Social Interaction Social Interaction:  3-Interacts appropriately 50 -  74% of the time - May be physically or verbally inappropriate. Problem Solving Problem Solving: 1-Solves basic less than 25% of the time - needs direction nearly all the time or does not effectively solve problems and may need a restraint for safety Memory Memory: 1-Recognizes or recalls less than 25% of the time/requires cueing greater than 75% of the time FIM - Eating Eating Activity: 5: Supervision/cues Pain Pain Assessment Pain Assessment: No/denies pain  Therapy/Group: Individual Therapy  Keene Gilkey 04/23/2014, 2:44 PM

## 2014-04-23 NOTE — Progress Notes (Signed)
Physical Therapy Session Note  Patient Details  Name: Justin Pugh MRN: [id number] Date of Birth: Nov 30, 1955  Today's Date: 04/23/2014 PT Co-Treatment Time: 0930-1000 (full co-tx [phone]) PT Co-Treatment Time Calculation (min): 30 min (full time 60 min)  Short Term Goals: Week 1:  PT Short Term Goal 1 (Week 1): Patient will perform functional ambulation < 150 ft in home and community environments with supervision.  PT Short Term Goal 2 (Week 1): Patient will negotiate flight of stairs with 1 rail and mod I.  PT Short Term Goal 3 (Week 1): Patient will sustain attention to therapeutic activity x 3 min with min cues.  PT Short Term Goal 4 (Week 1): Patient will demonstrate intellectual awareness in controlled environment with mod cues.  PT Short Term Goal 5 (Week 1): Patient will demonstrate intellectual awareness in controlled environment with min cues.  Skilled Therapeutic Interventions/Progress Updates:   Patient seen for skilled therapeutic co-treat with OT (KNP) to address sustained attention, orientation, awareness, and patient safety. Patient received in unzipped enclosure bed with RN present initially, polite and appreciative of therapists providing patient with blanket and breakfast. Engaged in therapeutic conversation with patient hyperverbal with confabulation and language of confusion. Pt eventually exited bed when directed to toilet in bathroom with min cues for hand washing. Patient oriented to person, place, and time with min cues and increased time. Patient disoriented to situation and age, patient was re-oriented but refused re-orientation from therapists. Patient recalled elopement attempt and was able to state 2 activities from physical therapy session yesterday. Patient ambulated to family room to prepare coffee with supervision and easily returned to room. In order for enclosure bed to be cleaned, patient ambulated while carrying breakfast tray to and from day room with  supervision and controlled speed, demonstrating safety awareness. Patient required max cues for attending to breakfast tray and for taking small bites/sips. Patient began to discuss elopement from hospital and became tearful several times but was easily redirected. Patient returned to room and agreeable to return to enclosure bed with min cues and no signs of escalation/agitation. Patient left sitting in enclosure bed.   Therapy Documentation Precautions:  Precautions Precautions: Fall Precaution Comments: Elopement risk, agitation, PEG Restrictions Weight Bearing Restrictions: No Pain: Pain Assessment Pain Assessment: No/denies pain  See FIM for current functional status  Therapy/Group: Co-Treatment with OT  Bayard HuggerVarner, Shawonda Kerce A 04/23/2014, 11:13 AM

## 2014-04-23 NOTE — Progress Notes (Signed)
Social Work Patient ID: Justin Pugh, male   DOB: Aug 09, 1955, 59 y.o.   MRN: [id number]   Met with pt's mother yesterday afternoon.  She is aware that team recommended re-start of all therapies based on pt's improvement in cognition and behavior overall.  Unfortunately, upon mother's arrival to unit, pt had just been returned to room after he was able to leave unit.  Returned Games developer. Team continues to review best approach to allow pt as much physicial freedom but avoid elopement.  Hope that pt may reach a behavior level that the enclosure bed could be d/c'd and may be able to consider SNF or ALF with locked unit.  Mother aware and agreeable.  Justin Otting, LCSW

## 2014-04-23 NOTE — Progress Notes (Signed)
Occupational Therapy Session Note  Patient Details  Name: Justin Pugh MRN: [id number] Date of Birth: 10-09-1955  Today's Date: 04/23/2014 OT Co-Treatment Time: 0900 (co-tx with PT (RV) 0900-1000)-0930 OT Co-Treatment Time Calculation (min): 30 min   Short Term Goals: Week 1:  OT Short Term Goal 1 (Week 1): Pt will initiate 1 self-care task with max cues OT Short Term Goal 1 - Progress (Week 1): Met OT Short Term Goal 2 (Week 1): Pt will demonstrate sustained attention to functional task for 2 min with max cues OT Short Term Goal 2 - Progress (Week 1): Not met OT Short Term Goal 3 (Week 1): Pt will conisstently be orientated x2 with max cues OT Short Term Goal 3 - Progress (Week 1): Met OT Short Term Goal 4 (Week 1): Pt will remain in room with enclosure bed open for 15 min without cues for redirection  OT Short Term Goal 4 - Progress (Week 1): Progressing toward goal  Skilled Therapeutic Interventions/Progress Updates:    Pt seen for therapeutic co-tx with PT (RV) with focus on sustained attention, orientation, intellectual awareness, and safety. Pt received in enclosure bed politely asking therapist for another blanket. Therapists returned with blanket and breakfast and pt very thankful. Patient sat in unzipped bed while engaging in therapeutic conversation. Pt oriented to person, place, and time with increased time and min cues. Pt recalled elopement attempt yesterday as well as 2 activities completed in therapy. Pt required total A for intellectual awareness and orientation to situation. Pt ambulated to bathroom to complete toileting with min cues for hand hygiene. Ambulated to family room to prepare coffee at supervision level then returned to room with min cues. Pt agreeable eat breakfast in day room for change in scenery and to allow for enclosure bed to be cleaned. Pt carried breakfast tray at supervision level and demonstrated improved safety as he slowed speed of ambulation. Pt  required max cues for sustained attention to breakfast due to hyperverbal with confabulation and language of confusion. Pt making subtle comments related to elopement, however easily redirected. Pt returned to room at end of session and agreeable to return to enclosure bed. Pt left with all needs in reach. Pt with no signs of escalating behavior or agitation throughout session.   Therapy Documentation Precautions:  Precautions Precautions: Fall Precaution Comments: Elopement risk, agitation, PEG Restrictions Weight Bearing Restrictions: No General:   Vital Signs: Therapy Vitals Temp: 98.6 F (37 C) Temp Source: Oral Pulse Rate: 73 Resp: 18 BP: (!) 115/54 mmHg Oxygen Therapy SpO2: 98 % O2 Device: Not Delivered Pain: Pt with no report of pain during therapy session  See FIM for current functional status  Therapy/Group: Co-Treatment  [name] N 04/23/2014, 7:23 AM

## 2014-04-23 NOTE — Progress Notes (Signed)
[facility]IC[facility] & REHABILITATION     PROGRESS NOTE    Subjective/Complaints: Confabulatory, redirectable. Non-agitated--no change ROS limited due to cognition  Objective: Vital Signs: Blood pressure 121/60, pulse 77, temperature 98.6 F (37 C), temperature source Oral, resp. rate 18, height 6' (1.829 m), weight 81.3 kg (179 lb 3.7 oz), SpO2 98 %. No results found.  Recent Labs  04/21/14 0532  WBC 8.3  HGB 12.2*  HCT 36.4*  PLT 319    Recent Labs  04/21/14 0532  NA 138  K 3.9  CL 100  GLUCOSE 87  BUN 8  CREATININE 0.64  CALCIUM 8.8   CBG (last 3)  No results for input(s): GLUCAP in the last 72 hours.  Wt Readings from Last 3 Encounters:  04/23/14 81.3 kg (179 lb 3.7 oz)  03/09/14 84.188 kg (185 lb 9.6 oz)  01/23/14 95.255 kg (210 lb)    Physical Exam:  Constitutional: He appears well-developed and well-nourished. He is sleeping. He is easily aroused.  HENT:  Head: Normocephalic.  Neck: supple Cardiovascular: Normal rate and regular rhythm.  Respiratory: Effort normal. No respiratory distress.  GI: Soft. Bowel sounds are normal. He exhibits no distension. There is mild abdominal tenderness, peg site a little red Musculoskeletal: He exhibits no edema.  Neurological: He is alert today.  Phonation improved. A little hoarse  restless.   Moves all extremities without difficulty. equally moves all 4's. Senses pain in all 4. Oriented to name only. confabulates Skin: Skin is warm and dry.  Psychiatric:  Confused   Assessment/Plan: 1. 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 Physiatrist is providing close team supervision and 24 hour management of active medical problems listed below. Physiatrist and rehab team continue to assess barriers to discharge/monitor patient progress toward functional and medical goals.  Therapies dc'ed. Pt does not  need assistance to ambulate.    FIM: FIM - Bathing Bathing Steps Patient Completed: Chest, Abdomen, Front perineal area, Right Arm, Left Arm, Buttocks, Right upper leg, Left upper leg, Left lower leg (including foot), Right lower leg (including foot) Bathing: 5: Supervision: Safety issues/verbal cues  FIM - Upper Body Dressing/Undressing Upper body dressing/undressing steps patient completed: Thread/unthread right sleeve of pullover shirt/dresss, Thread/unthread left sleeve of pullover shirt/dress, Put head through opening of pull over shirt/dress, Pull shirt over trunk Upper body dressing/undressing: 5: Supervision: Safety issues/verbal cues FIM - Lower Body Dressing/Undressing Lower body dressing/undressing steps patient completed: Thread/unthread right underwear leg, Thread/unthread left underwear leg, Pull underwear up/down, Thread/unthread right pants leg, Thread/unthread left pants leg, Pull pants up/down, Don/Doff right sock, Don/Doff left sock, Don/Doff right shoe, Fasten/unfasten left shoe, Fasten/unfasten right shoe, Don/Doff left shoe Lower body dressing/undressing: 5: Set-up assist to: Obtain clothing  FIM - Toileting Toileting steps completed by patient: Adjust clothing prior to toileting, Performs perineal hygiene, Adjust clothing after toileting Toileting Assistive Devices: Grab bar or rail for support Toileting: 5: Supervision: Safety issues/verbal cues  FIM - Diplomatic Services operational officer Devices: Therapist, music Transfers: 5-To toilet/BSC: Supervision (verbal cues/safety issues), 5-From toilet/BSC: Supervision (verbal cues/safety issues)  FIM - Banker Devices: Arm rests Bed/Chair Transfer: 6: Bed > Chair or W/C: No assist, 6: Chair or W/C > Bed: No assist  FIM - Locomotion: Wheelchair Locomotion: Wheelchair: 0: Activity did not occur (pt ambulatory) FIM - Locomotion: Ambulation Locomotion: Ambulation Assistive  Devices: Other (comment) (none) Ambulation/Gait Assistance: 6: Modified  independent (Device/Increase time) Locomotion: Ambulation: 6: Travels 150 ft or more independently/takes more than reasonable amount of time  Comprehension Comprehension Mode: Auditory Comprehension: 3-Understands basic 50 - 74% of the time/requires cueing 25 - 50%  of the time  Expression Expression Mode: Verbal Expression: 3-Expresses basic 50 - 74% of the time/requires cueing 25 - 50% of the time. Needs to repeat parts of sentences.  Social Interaction Social Interaction Mode: Not assessed Social Interaction: 3-Interacts appropriately 50 - 74% of the time - May be physically or verbally inappropriate.  Problem Solving Problem Solving: 1-Solves basic less than 25% of the time - needs direction nearly all the time or does not effectively solve problems and may need a restraint for safety  Memory Memory: 1-Recognizes or recalls less than 25% of the time/requires cueing greater than 75% of the time  Medical Problem List and Plan: 1. Functional deficits secondary to TBI   2. DVT Prophylaxis/Anticoagulation: Pharmaceutical: Lovenox 3. Pain Management: Will continue oxycodone prn. Will likely need premedication as unable to express needs. Will monitor for now.  4. Mood: Unable to gauge at this time due to cognitive deficits. LCSW to follow along for evaluation and support once more appropriate.  5. Neuropsych: This patient is not capable of making decisions on her own behalf. -continue in vail for safety 6. Skin/Wound Care: pt is mobile 7. Fluids/Electrolytes/Nutrition:megace stoppped .    8. Agitation: confused but less agitated.   -seroquel  -klonopin and librium stopped due to lethargy  -increased depakote to 1000mg  bid---re-check level later in week or early next -sleep cycle overall better  -Unzipping vail bed during the day--dc  soon?  -appreciate psych assessment and input--- 9. ?GERD/odynophagia/dysphagia--D1 diet per SLP  -PEG placed and functioning--hasn't been pulled out.  - bolus feeds. 10. RML pneumonia---levaquin--dc'ed  -cxr on 2/18 WITHOUT ACUTE FINDINGS  -afebrile-  -wbc's improved  -off levaquin currently 11. Bowel and bladder: emptying   LOS (Days) 45 A FACE TO FACE EVALUATION WAS PERFORMED  Justin Pugh T 04/23/2014 9:44 AM

## 2014-04-23 NOTE — Progress Notes (Signed)
Patient was able to get out of the enclosure bed.  Pt. Subsequently exited the emergency exit with some of his belongings.  Staff retrieved the patient at the bottom of the stairwell and place the pt. Back in the enclosure bed.  Peri MarisAndrew Jaylen Claude, MBA, BS, RN

## 2014-04-24 ENCOUNTER — Inpatient Hospital Stay (HOSPITAL_COMMUNITY): Payer: Medicaid Other | Admitting: Occupational Therapy

## 2014-04-24 ENCOUNTER — Inpatient Hospital Stay (HOSPITAL_COMMUNITY): Payer: Medicaid Other | Admitting: *Deleted

## 2014-04-24 NOTE — Progress Notes (Addendum)
Occupational Therapy Session Note  Patient Details  Name: Justin CohoJeffrey L Pugh MRN: [id number] Date of Birth: 1956/02/18  Today's Date: 04/24/2014 OT Individual Time: 1000-1011 OT Individual Time Calculation (min): 11 min  and Today's Date: 04/24/2014 OT Missed Time: 34 Minutes Missed Time Reason: Patient fatigue;Patient unwilling/refused to participate without medical reason   Short Term Goals: Week 4:  OT Short Term Goal 1 (Week 4): Pt will participate in 45 min therapy session without verbal or phyical agitation  OT Short Term Goal 2 (Week 4): Pt will identify 1 physical or cognitive deficit with max cues OT Short Term Goal 3 (Week 4): Pt will be oriented x3 with max cues  Skilled Therapeutic Interventions/Progress Updates:    Pt laying in bed covered up and and pad folded and placed on his eyes.  Pt was mildly agitated on arrival and exhibited increased verbal agitation with continued conversation.  Pt continued to verbalize that he was mad because he had been told that if his "behviors" improved he could leave.  Pt was upset because he wasn't able to leave.  Pt declined taking a shower when offered and stated that he wasn't going to do anything.  Pt missed 34 mins skilled OT services secondary to increased agitation during therapy session. Focus on orientation, awareness, activity tolerance, and active participation. Therapy Documentation Precautions:  Precautions Precautions: Fall Precaution Comments: Elopement risk, agitation, PEG Restrictions Weight Bearing Restrictions: No General: General OT Amount of Missed Time: 34 Minutes Pain: Pain Assessment Pain Assessment: No/denies pain  See FIM for current functional status  Therapy/Group: Individual Therapy  Rich BraveLanier, Irma Delancey Chappell 04/24/2014, 10:20 AM

## 2014-04-24 NOTE — Progress Notes (Signed)
Speech Language Pathology Daily Session Note  Patient Details  Name: Justin Pugh MRN: [id number] Date of Birth: [birth year]  Today's Date: 04/24/2014 SLP Individual Time: 1130-1200 SLP Individual Time Calculation (min): 30 min  Short Term Goals: Week 7: SLP Short Term Goal 1 (Week 7): Patient will orient to time, place and situation with Max A multimodal cues.  SLP Short Term Goal 2 (Week 7): Patient will demonstrate sustained attention to functional task for 2 minutes with Max A multimodal cues.  SLP Short Term Goal 3 (Week 7): Patient will demonstrate efficient mastication with trials of Dys. 2  textures with minimal overt s/s of aspiration with Min A multimodal cues. SLP Short Term Goal 4 (Week 7): Patient will consume trials of thin liquids with minimal overt s/s of aspiration with supervision multimodal cues for use of swallowing strategies.   Skilled Therapeutic Interventions: Skilled treatment session focused on addressing dysphagia goals. SLP facilitated session with Supervision question cues to problem solve tray set up. Patient distracted by wanting to get out of the hospital and attempting to locate paperwork from previous facility.  SLP redirected patient to self-feeding by scooping initial bite on spoon; patient then self-feed ~15% of Dys. 2 textures with thin liquids with no overt s/s of aspiration.  Patient continued to expressively communication with confabulation and language of confusion but was easily redirected to self-feeing task.  Patient carried lunch tray to put it away and returned to room with Min cues for redirection.  Recommend to continue with diet upgrade.     FIM:  Comprehension Comprehension Mode: Auditory Comprehension: 3-Understands basic 50 - 74% of the time/requires cueing 25 - 50%  of the time Expression Expression Mode: Verbal Expression: 3-Expresses basic 50 - 74% of the time/requires cueing 25 - 50% of the time. Needs to repeat parts of  sentences. Social Interaction Social Interaction: 3-Interacts appropriately 50 - 74% of the time - May be physically or verbally inappropriate. Problem Solving Problem Solving: 1-Solves basic less than 25% of the time - needs direction nearly all the time or does not effectively solve problems and may need a restraint for safety Memory Memory: 1-Recognizes or recalls less than 25% of the time/requires cueing greater than 75% of the time FIM - Eating Eating Activity: 5: Supervision/cues  Pain Pain Assessment Pain Assessment: No/denies pain  Therapy/Group: Individual Therapy  Charlane FerrettiMelissa Damir Leung, M.A., CCC-SLP [phone]  [name] 04/24/2014, 12:20 PM

## 2014-04-24 NOTE — Progress Notes (Signed)
Physical Therapy Session Note  Patient Details  Name: Justin Pugh MRN: [id number] Date of Birth: 1955-07-10  Today's Date: 04/24/2014 PT Individual Time: 0800-0843 PT Individual Time Calculation (min): 43 min   Short Term Goals: Week 1:  PT Short Term Goal 1 (Week 1): Patient will perform functional ambulation < 150 ft in home and community environments with supervision.  PT Short Term Goal 2 (Week 1): Patient will negotiate flight of stairs with 1 rail and mod I.  PT Short Term Goal 3 (Week 1): Patient will sustain attention to therapeutic activity x 3 min with min cues.  PT Short Term Goal 4 (Week 1): Patient will demonstrate intellectual awareness in controlled environment with mod cues.   Skilled Therapeutic Interventions/Progress Updates:   Session focused on sustained > alternating attention, command following, orientation, awareness, and safety. Patient received lying on bed looking at magazine, sitter present. Patient initially perseverating on leaving hospital with clothing noted to be packed on bed but was redirected with increased time and mod verbal cues to structured multi-step therapeutic activity. Patient provided with visual aide of numbered list of Berg Balance Scale tasks. Patient was able to follow list, check off items in order, and complete tasks with max verbal cues and visual demonstration. Patient scored 46/56 on Berg Balance Scale with score more limited by decreased attention to task and decreased ability to follow commands than by balance impairments. Patient ambulated in controlled environment throughout rehab unit with mod I and recalled negotiating flight of stairs from previous therapy session. In stairwell, pt negotiated up/down 2 flights of stairs using 1 rail with mod I. In therapy gym, patient engaged in game of Connect 4 with impulsive game play requiring initial redirection to rules of game with min verbal cues and then min questioning cues for turn taking and  game strategy. Pt able to attend to task until completion of game within reasonable amount of time. Patient self elected to use rebounder with small weighted ball with alt UE catching/throwing x 10. Patient instructed to complete pipeline tree puzzle of moderate difficulty with initial impulsivity requiring mod verbal/tactile cues to attend to instructions. Patient able to sustain attention to task until completion, self-monitor and correct errors, as well as alternate attention between task and conversing with therapist while completing pipeline puzzle. Patient returned to room with no signs of escalation/agitation and thanked therapist at end of session. Patient left sitting on bed with sitter present.   Therapy Documentation Precautions:  Precautions Precautions: Fall Precaution Comments: Elopement risk, agitation, PEG Restrictions Weight Bearing Restrictions: No Pain: Pain Assessment Pain Assessment: No/denies pain Locomotion : Ambulation Ambulation/Gait Assistance: 6: Modified independent (Device/Increase time)   See FIM for current functional status  Therapy/Group: Individual Therapy  [name], Jamill Wetmore A 04/24/2014, 8:54 AM

## 2014-04-24 NOTE — Progress Notes (Signed)
[facility] PHYSICAL MEDICINE & REHABILITATION     PROGRESS NOTE    Subjective/Complaints: Confused. Non-agitated. No new problems overnight ROS limited due to cognition  Objective: Vital Signs: Blood pressure 145/76, pulse 87, temperature 98.3 F (36.8 C), temperature source Oral, resp. rate 18, height 6' (1.829 m), weight 81.647 kg (180 lb), SpO2 100 %. No results found. No results for input(s): WBC, HGB, HCT, PLT in the last 72 hours. No results for input(s): NA, K, CL, GLUCOSE, BUN, CREATININE, CALCIUM in the last 72 hours.  Invalid input(s): CO CBG (last 3)  No results for input(s): GLUCAP in the last 72 hours.  Wt Readings from Last 3 Encounters:  04/24/14 81.647 kg (180 lb)  03/09/14 84.188 kg (185 lb 9.6 oz)  01/23/14 95.255 kg (210 lb)    Physical Exam:  Constitutional: He appears well-developed and well-nourished. He is sleeping. He is easily aroused.  HENT:  Head: Normocephalic.  Neck: supple Cardiovascular: Normal rate and regular rhythm.  Respiratory: Effort normal. No respiratory distress.  GI: Soft. Bowel sounds are normal. He exhibits no distension. There is mild abdominal tenderness, peg site a little red Musculoskeletal: He exhibits no edema.  Neurological: He is alert today.  Phonation improved. A little hoarse  restless.   Moves all extremities without difficulty. equally moves all 4's. Senses pain in all 4. Oriented to name only. confabulates Skin: Skin is warm and dry.  Psychiatric:  Confused   Assessment/Plan: 1. 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 Physiatrist is providing close team supervision and 24 hour management of active medical problems listed below. Physiatrist and rehab team continue to assess barriers to discharge/monitor patient progress toward functional and medical goals.  Therapies have picked patient back up due to  progress.Marland Kitchen.    FIM: FIM - Bathing Bathing Steps Patient Completed: Chest, Abdomen, Front perineal area, Right Arm, Left Arm, Buttocks, Right upper leg, Left upper leg, Left lower leg (including foot), Right lower leg (including foot) Bathing: 5: Supervision: Safety issues/verbal cues  FIM - Upper Body Dressing/Undressing Upper body dressing/undressing steps patient completed: Thread/unthread right sleeve of pullover shirt/dresss, Thread/unthread left sleeve of pullover shirt/dress, Put head through opening of pull over shirt/dress, Pull shirt over trunk Upper body dressing/undressing: 5: Supervision: Safety issues/verbal cues FIM - Lower Body Dressing/Undressing Lower body dressing/undressing steps patient completed: Thread/unthread right underwear leg, Thread/unthread left underwear leg, Pull underwear up/down, Thread/unthread right pants leg, Thread/unthread left pants leg, Pull pants up/down, Don/Doff right sock, Don/Doff left sock, Don/Doff right shoe, Fasten/unfasten left shoe, Fasten/unfasten right shoe, Don/Doff left shoe Lower body dressing/undressing: 5: Set-up assist to: Obtain clothing  FIM - Toileting Toileting steps completed by patient: Adjust clothing prior to toileting, Performs perineal hygiene, Adjust clothing after toileting Toileting Assistive Devices: Grab bar or rail for support Toileting: 5: Supervision: Safety issues/verbal cues  FIM - Diplomatic Services operational officerToilet Transfers Toilet Transfers Assistive Devices: Therapist, musicGrab bars Toilet Transfers: 5-To toilet/BSC: Supervision (verbal cues/safety issues), 5-From toilet/BSC: Supervision (verbal cues/safety issues)  FIM - BankerBed/Chair Transfer Bed/Chair Transfer Assistive Devices: Arm rests Bed/Chair Transfer: 6: Bed > Chair or W/C: No assist, 6: Chair or W/C > Bed: No assist, 7: Supine > Sit: No assist, 7: Sit > Supine: No assist  FIM - Locomotion: Wheelchair Locomotion: Wheelchair: 0: Activity did not occur FIM - Locomotion: Ambulation Locomotion:  Ambulation Assistive Devices: Other (comment) (none) Ambulation/Gait Assistance: 6: Modified independent (Device/Increase time) Locomotion: Ambulation: 6: Travels 150  ft or more independently/takes more than reasonable amount of time  Comprehension Comprehension Mode: Auditory Comprehension: 3-Understands basic 50 - 74% of the time/requires cueing 25 - 50%  of the time  Expression Expression Mode: Verbal Expression: 3-Expresses basic 50 - 74% of the time/requires cueing 25 - 50% of the time. Needs to repeat parts of sentences.  Social Interaction Social Interaction Mode: Not assessed Social Interaction: 3-Interacts appropriately 50 - 74% of the time - May be physically or verbally inappropriate.  Problem Solving Problem Solving: 1-Solves basic less than 25% of the time - needs direction nearly all the time or does not effectively solve problems and may need a restraint for safety  Memory Memory: 1-Recognizes or recalls less than 25% of the time/requires cueing greater than 75% of the time  Medical Problem List and Plan: 1. Functional deficits secondary to TBI   2. DVT Prophylaxis/Anticoagulation: Pharmaceutical: Lovenox 3. Pain Management: Will continue oxycodone prn. Will likely need premedication as unable to express needs. Will monitor for now.  4. Mood: Unable to gauge at this time due to cognitive deficits. LCSW to follow along for evaluation and support once more appropriate.  5. Neuropsych: This patient is not capable of making decisions on her own behalf. -continue in vail for safety 6. Skin/Wound Care: pt is mobile 7. Fluids/Electrolytes/Nutrition:megace stoppped .    8. Agitation: confused but less agitated.   -seroquel  -klonopin and librium stopped due to lethargy  -increased depakote to  bid---re-check level later in week or early next -sleep cycle overall better  -Unzipping vail bed during the  day--dc next week?  -appreciate psych assessment and input--- 9. ?GERD/odynophagia/dysphagia--D1 diet per SLP  -PEG placed and functioning--hasn't been pulled out.  - bolus feeds. 10. RML pneumonia---levaquin--dc'ed  -cxr on 2/18 WITHOUT ACUTE FINDINGS  -afebrile-  -wbc's improved  -off levaquin currently 11. Bowel and bladder: emptying   LOS (Days) 46 A FACE TO FACE EVALUATION WAS PERFORMED  Clarissa Laird T 04/24/2014 7:46 AM

## 2014-04-25 ENCOUNTER — Inpatient Hospital Stay (HOSPITAL_COMMUNITY): Payer: Medicaid Other

## 2014-04-25 ENCOUNTER — Inpatient Hospital Stay (HOSPITAL_COMMUNITY): Payer: Medicaid Other | Admitting: *Deleted

## 2014-04-25 ENCOUNTER — Inpatient Hospital Stay (HOSPITAL_COMMUNITY): Payer: Medicaid Other | Admitting: Physical Therapy

## 2014-04-25 NOTE — Progress Notes (Signed)
Occupational Therapy Session Note  Patient Details  Name: Justin Pugh MRN: [id number] Date of Birth: 24-Jul-1955  Today's Date: 04/25/2014 OT Individual Time:  -   1330-1415  (45 min)      Short Term Goals: Week 1:  OT Short Term Goal 1 (Week 1): Pt will initiate 1 self-care task with max cues OT Short Term Goal 1 - Progress (Week 1): Met OT Short Term Goal 2 (Week 1): Pt will demonstrate sustained attention to functional task for 2 min with max cues OT Short Term Goal 2 - Progress (Week 1): Not met OT Short Term Goal 3 (Week 1): Pt will conisstently be orientated x2 with max cues OT Short Term Goal 3 - Progress (Week 1): Met OT Short Term Goal 4 (Week 1): Pt will remain in room with enclosure bed open for 15 min without cues for redirection  OT Short Term Goal 4 - Progress (Week 1): Progressing toward goal Week 2:  OT Short Term Goal 1 (Week 2): Pt will wash 10/10 body parts at supervision level with max multimodal cues OT Short Term Goal 1 - Progress (Week 2): Met OT Short Term Goal 2 (Week 2): Pt will be oriented x2 with max cues  OT Short Term Goal 2 - Progress (Week 2): Not met OT Short Term Goal 3 (Week 2): Pt will demonstrate sustained attention to functional task for 1 min with max cues  OT Short Term Goal 3 - Progress (Week 2): Other (comment) (pt had met goal however due to agitation over past few days, pt has not met goal) OT Short Term Goal 4 (Week 2): Pt will identify 1 physical or cognitive deficit with max cues OT Short Term Goal 4 - Progress (Week 2): Not met Week 3:  OT Short Term Goal 1 (Week 3): Pt will identify 1 physical or cognitive deficit with max cues OT Short Term Goal 1 - Progress (Week 3): Not met OT Short Term Goal 2 (Week 3): Pt will follow behavioral management plan for out of room schedule with therapy 100% of time with max cues OT Short Term Goal 2 - Progress (Week 3): Partly met OT Short Term Goal 3 (Week 3): Pt will be oriented x2 with max cues  OT  Short Term Goal 3 - Progress (Week 3): Met OT Short Term Goal 4 (Week 3): Pt will initiate 1 self-care task with min cues OT Short Term Goal 4 - Progress (Week 3): Met Week 4:  OT Short Term Goal 1 (Week 4): Pt will participate in 45 min therapy session without verbal or phyical agitation  OT Short Term Goal 2 (Week 4): Pt will identify 1 physical or cognitive deficit with max cues OT Short Term Goal 3 (Week 4): Pt will be oriented x3 with max cues  Skilled Therapeutic Intervention Pt. Sitting in room EOB with sitter present.   Pt. Talking a lot about being homeless, job in restoring floors, and being in trouble.  His conversation was disjointed.  Interjected questions about time,etc.  Pt knew month and year.  He reported he had a schedule which he could not discern.  Went over with him but short attention and increased frustration prevented further teaching.  Pt. Ambulated to bathroom and to get water 8 times during session.  He reported being off soda and ETOL.  He had 10  Eye contact during session and OT pointed out.  He then attempted to make some eye contact during conversation.  Left pt in room with sitter and  call bell,phone within reach.      Precautions:  Precautions Precautions: Fall Precaution Comments: Elopement risk, agitation, PEG Restrictions Weight Bearing Restrictions: No    Pain:none       See FIM for current functional status  Therapy/Group: Individual Therapy  Lisa Roca 04/25/2014, 1:39 PM

## 2014-04-25 NOTE — Progress Notes (Signed)
Physical Therapy Session Note  Patient Details  Name: Justin Pugh MRN: [id number] Date of Birth: 1955/06/21  Today's Date: 04/25/2014 PT Individual Time: 1115-1150 PT Individual Time Calculation (min): 35 min   Short Term Goals: Week 1:  PT Short Term Goal 1 (Week 1): Patient will perform functional ambulation < 150 ft in home and community environments with supervision.  PT Short Term Goal 2 (Week 1): Patient will negotiate flight of stairs with 1 rail and mod I.  PT Short Term Goal 3 (Week 1): Patient will sustain attention to therapeutic activity x 3 min with min cues.  PT Short Term Goal 4 (Week 1): Patient will demonstrate intellectual awareness in controlled environment with mod cues.   Skilled Therapeutic Interventions/Progress Updates:  Pt was seen bedside in the am with sitter in room. Treatment focused on attention, command following, orientation, and safety. Pt initially reluctant on participating with therapy, however agreed with encouragement. Pt fixated on going outside, however redirected and explained it was too cold today to go outside, pt verbalized understanding and participated with therapy. Pt went to bathroom prior to heading to gym.  Initially proceeded to gym with pt's assistance to find way. Once in gym, pt stated he wanted to sit and rest, pt uninterested in performing any activities in the gym. Pt requested we go to day room, pt able to navigate to day room without assist. Pt stopped in the family room along and requested a cup of coffee. Pt able to get a cup of coffee with S only. Pt saw the books in family room and selected a book to take back to his room. Pt then became fixated on lunch and returning to room so he would be ready when it came. Attempted to redirect without success. Pt returned to room and left sitting on edge of bed with sitter at bedside.    Therapy Documentation Precautions:  Precautions Precautions: Fall Precaution Comments: Elopement risk,  agitation, PEG Restrictions Weight Bearing Restrictions: No General: PT Amount of Missed Time (min): 10 Minutes PT Missed Treatment Reason: Patient unwilling to participate Vital Signs:   Pain: No c/o pain.    Locomotion : Ambulation Ambulation/Gait Assistance: 6: Modified independent (Device/Increase time)   See FIM for current functional status  Therapy/Group: Individual Therapy  Rayford HalstedMitchell, Caleel Kiner G 04/25/2014, 12:52 PM

## 2014-04-25 NOTE — Progress Notes (Addendum)
[place] PHYSICAL MEDICINE & REHABILITATION     PROGRESS NOTE    Subjective/Complaints: Confused. Non-agitated.Per RN pt is frequently handling tube, he denies pain around tube Per RN leakage of TF and meds when pushed ito tube.  Leakage around stoma ROS limited due to cognition  Objective: Vital Signs: Blood pressure 122/86, pulse 80, temperature 97.7 F (36.5 C), temperature source Oral, resp. rate 17, height 6' (1.829 m), weight 81.6 kg (179 lb 14.3 oz), SpO2 100 %. No results found. No results for input(s): WBC, HGB, HCT, PLT in the last 72 hours. No results for input(s): NA, K, CL, GLUCOSE, BUN, CREATININE, CALCIUM in the last 72 hours.  Invalid input(s): CO CBG (last 3)  No results for input(s): GLUCAP in the last 72 hours.  Wt Readings from Last 3 Encounters:  04/25/14 81.6 kg (179 lb 14.3 oz)  03/09/14 84.188 kg (185 lb 9.6 oz)  01/23/14 95.255 kg (210 lb)    Physical Exam:  Constitutional: He appears well-developed and well-nourished. He is sleeping. He is easily aroused.  HENT:  Head: Normocephalic.  Neck: supple Cardiovascular: Normal rate and regular rhythm.  Respiratory: Effort normal. No respiratory distress.  GI: Soft. Bowel sounds are normal. He exhibits no distension. There is mild abdominal tenderness, peg site a little red, no drainage or tenderness Musculoskeletal: He exhibits no edema.  Neurological: He is alert today.  Phonation improved. A little hoarse  restless.   Moves all extremities without difficulty. equally moves all 4's. Senses pain in all 4. Oriented to name only. confabulates Skin: Skin is warm and dry.  Psychiatric:  Confused   Assessment/Plan: 1. 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 Physiatrist is providing close team supervision and 24 hour management of active medical problems listed below. Physiatrist and rehab team  continue to assess barriers to discharge/monitor patient progress toward functional and medical goals. Rec check tube placement with gastrograffin, taked to radiology PA and she stated they only did "emergent studies" over the weekend.  Trauma team placed Gastrostomy on 2/15 will ask them to eval Hold TF, po itake is good, fluid intake ok as well Trying meds po in applesauce Hold flushes FIM: FIM - Bathing Bathing Steps Patient Completed: Chest, Abdomen, Front perineal area, Right Arm, Left Arm, Buttocks, Right upper leg, Left upper leg, Left lower leg (including foot), Right lower leg (including foot) Bathing: 5: Supervision: Safety issues/verbal cues  FIM - Upper Body Dressing/Undressing Upper body dressing/undressing steps patient completed: Thread/unthread right sleeve of pullover shirt/dresss, Thread/unthread left sleeve of pullover shirt/dress, Put head through opening of pull over shirt/dress, Pull shirt over trunk Upper body dressing/undressing: 5: Supervision: Safety issues/verbal cues FIM - Lower Body Dressing/Undressing Lower body dressing/undressing steps patient completed: Thread/unthread right underwear leg, Thread/unthread left underwear leg, Pull underwear up/down, Thread/unthread right pants leg, Thread/unthread left pants leg, Pull pants up/down, Don/Doff right sock, Don/Doff left sock, Don/Doff right shoe, Fasten/unfasten left shoe, Fasten/unfasten right shoe, Don/Doff left shoe Lower body dressing/undressing: 5: Set-up assist to: Obtain clothing  FIM - Toileting Toileting steps completed by patient: Adjust clothing prior to toileting, Performs perineal hygiene, Adjust clothing after toileting Toileting Assistive Devices: Grab bar or rail for support Toileting: 7: Independent: No helper, no device  FIM - Diplomatic Services operational officerToilet Transfers Toilet Transfers Assistive Devices: Grab bars Toilet Transfers: 7-To toilet/BSC, 7-From toilet/BSC  FIM - Landscape architectBed/Chair Transfer Bed/Chair Transfer  Assistive Devices: Arm rests Bed/Chair Transfer: 6: Bed >  Chair or W/C: No assist, 6: Chair or W/C > Bed: No assist, 7: Supine > Sit: No assist, 7: Sit > Supine: No assist  FIM - Locomotion: Wheelchair Locomotion: Wheelchair: 0: Activity did not occur FIM - Locomotion: Ambulation Locomotion: Ambulation Assistive Devices: Other (comment) (none) Ambulation/Gait Assistance: 6: Modified independent (Device/Increase time) Locomotion: Ambulation: 6: Travels 150 ft or more independently/takes more than reasonable amount of time  Comprehension Comprehension Mode: Auditory Comprehension: 3-Understands basic 50 - 74% of the time/requires cueing 25 - 50%  of the time  Expression Expression Mode: Verbal Expression: 3-Expresses basic 50 - 74% of the time/requires cueing 25 - 50% of the time. Needs to repeat parts of sentences.  Social Interaction Social Interaction Mode: Not assessed Social Interaction: 3-Interacts appropriately 50 - 74% of the time - May be physically or verbally inappropriate.  Problem Solving Problem Solving: 1-Solves basic less than 25% of the time - needs direction nearly all the time or does not effectively solve problems and may need a restraint for safety  Memory Memory: 1-Recognizes or recalls less than 25% of the time/requires cueing greater than 75% of the time  Medical Problem List and Plan: 1. Functional deficits secondary to TBI   2. DVT Prophylaxis/Anticoagulation: Pharmaceutical: Lovenox 3. Pain Management: Will continue oxycodone prn. Will likely need premedication as unable to express needs. Will monitor for now.  4. Mood: Unable to gauge at this time due to cognitive deficits. LCSW to follow along for evaluation and support once more appropriate.  5. Neuropsych: This patient is not capable of making decisions on her own behalf. -continue in vail for safety 6. Skin/Wound Care: pt is mobile 7. Fluids/Electrolytes/Nutrition:megace stoppped .     8. Agitation: confused but less agitated.   -seroquel  -klonopin and librium stopped due to lethargy  -increased depakote to  bid---re-check level later in week or early next -sleep cycle overall better    -appreciate psych assessment and input--- 9. ?GERD/odynophagia/dysphagia--D2 diet per SLP- intake improving  -PEG placed and ? functioning--hasn't been pulled out but cap may be sub q  - bolus feeds. 10. RML pneumonia-resolved   11. Bowel and bladder: emptying   LOS (Days) 47 A FACE TO FACE EVALUATION WAS PERFORMED  Erick Colace 04/25/2014 8:51 AM

## 2014-04-25 NOTE — Progress Notes (Signed)
Tube study confirms tube in place. I d/w the pt nurse that the bolster should be tightened up against the pt's abd wall with a 4x4 guazein between.   After meds administered via PEG tube, it should be flushed with 100cc water to prevent clogging.

## 2014-04-26 ENCOUNTER — Inpatient Hospital Stay (HOSPITAL_COMMUNITY): Payer: Medicaid Other | Admitting: Physical Therapy

## 2014-04-26 ENCOUNTER — Inpatient Hospital Stay (HOSPITAL_COMMUNITY): Payer: Medicaid Other | Admitting: *Deleted

## 2014-04-26 NOTE — Progress Notes (Signed)
Occupational Therapy Session Note  Patient Details  Name: Justin Pugh MRN: [id number] Date of Birth: 1955-03-07  Today's Date: 04/26/2014 OT Individual Time:  - 0254-2706  (28 min)      Short Term Goals: Week 1:  OT Short Term Goal 1 (Week 1): Pt will initiate 1 self-care task with max cues OT Short Term Goal 1 - Progress (Week 1): Met OT Short Term Goal 2 (Week 1): Pt will demonstrate sustained attention to functional task for 2 min with max cues OT Short Term Goal 2 - Progress (Week 1): Not met OT Short Term Goal 3 (Week 1): Pt will conisstently be orientated x2 with max cues OT Short Term Goal 3 - Progress (Week 1): Met OT Short Term Goal 4 (Week 1): Pt will remain in room with enclosure bed open for 15 min without cues for redirection  OT Short Term Goal 4 - Progress (Week 1): Progressing toward goal Week 2:  OT Short Term Goal 1 (Week 2): Pt will wash 10/10 body parts at supervision level with max multimodal cues OT Short Term Goal 1 - Progress (Week 2): Met OT Short Term Goal 2 (Week 2): Pt will be oriented x2 with max cues  OT Short Term Goal 2 - Progress (Week 2): Not met OT Short Term Goal 3 (Week 2): Pt will demonstrate sustained attention to functional task for 1 min with max cues  OT Short Term Goal 3 - Progress (Week 2): Other (comment) (pt had met goal however due to agitation over past few days, pt has not met goal) OT Short Term Goal 4 (Week 2): Pt will identify 1 physical or cognitive deficit with max cues OT Short Term Goal 4 - Progress (Week 2): Not met  Skilled Therapeutic Interventions/Progress Updates:    Pt. Engaged in conversation with OT about various subjects.  Pt stayed on one topic for 1 -22mnutes before diverting to another topic.  He maintained eye contact for 5 seconds but more frequent than yesterday (about 50 % today).  Pt. Getting water and going to bathroom during session with explanations as to what he was doing.  He conversed about survival in the  woods.with accurate/logical information.  Pt.did not agree to play cards, or shave.  He gave multiple reasons as to why he could not.  Pt maintained sustained attention to topic for 1 minute.  Left pt. In room with sitter at end of session.    Therapy Documentation Precautions:  Precautions Precautions: Fall Precaution Comments: Elopement risk, agitation, PEG Restrictions Weight Bearing Restrictions: No       Pain:  none   See FIM for current functional status  Therapy/Group: Individual Therapy  ELisa Roca3/07/2014, 1:46 PM

## 2014-04-26 NOTE — Progress Notes (Signed)
[place] PHYSICAL MEDICINE & REHABILITATION     PROGRESS NOTE    Subjective/Complaints: , he denies pain around tube  ROS limited due to cognition  Objective: Vital Signs: Blood pressure 119/77, pulse 80, temperature 98.4 F (36.9 C), temperature source Oral, resp. rate 18, height 6' (1.829 m), weight 82.283 kg (181 lb 6.4 oz), SpO2 99 %. Dg Abd Portable 1v  04/25/2014   CLINICAL DATA:  Percutaneous endoscopic gastrostomy tube placement. Evaluate placement.  EXAM: PORTABLE ABDOMEN - 1 VIEW  COMPARISON:  03/02/2014 radiographs  FINDINGS: Water-soluble contrast was injected through the patient's G-tube.  Single frontal view of the abdomen demonstrates contrast within the stomach and extending into the duodenum.  There is no definite evidence of contrast extravasation.  IMPRESSION: Gastrostomy tube within the stomach. No evidence of extraluminal contrast extravasation   Electronically Signed   By: Harmon Pier M.D.   On: 04/25/2014 14:59   No results for input(s): WBC, HGB, HCT, PLT in the last 72 hours. No results for input(s): NA, K, CL, GLUCOSE, BUN, CREATININE, CALCIUM in the last 72 hours.  Invalid input(s): CO CBG (last 3)  No results for input(s): GLUCAP in the last 72 hours.  Wt Readings from Last 3 Encounters:  04/26/14 82.283 kg (181 lb 6.4 oz)  03/09/14 84.188 kg (185 lb 9.6 oz)  01/23/14 95.255 kg (210 lb)    Physical Exam:  Constitutional: He appears well-developed and well-nourished. He is sleeping. He is easily aroused.  HENT:  Head: Normocephalic.  Neck: supple Cardiovascular: Normal rate and regular rhythm.  Respiratory: Effort normal. No respiratory distress.  GI: Soft. Bowel sounds are normal. He exhibits no distension. There is mild abdominal tenderness, peg site a little red, no drainage or tenderness Musculoskeletal: He exhibits no edema.  Neurological: He is alert today.  Phonation improved. A little hoarse  restless.   Moves all extremities without  difficulty. equally moves all 4's. Senses pain in all 4. Oriented to name only. confabulates Skin: Skin is warm and dry.  Psychiatric:  Confused   Assessment/Plan: 1. 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 Physiatrist is providing close team supervision and 24 hour management of active medical problems listed below. Physiatrist and rehab team continue to assess barriers to discharge/monitor patient progress toward functional and medical goals. Appreciate trauma note, contrast seen in stomach and duodenum although RN states there is some leakage around stoma FIM: FIM - Bathing Bathing Steps Patient Completed: Chest, Abdomen, Front perineal area, Right Arm, Left Arm, Buttocks, Right upper leg, Left upper leg, Left lower leg (including foot), Right lower leg (including foot) Bathing: 5: Supervision: Safety issues/verbal cues  FIM - Upper Body Dressing/Undressing Upper body dressing/undressing steps patient completed: Thread/unthread right sleeve of pullover shirt/dresss, Thread/unthread left sleeve of pullover shirt/dress, Put head through opening of pull over shirt/dress, Pull shirt over trunk Upper body dressing/undressing: 5: Supervision: Safety issues/verbal cues FIM - Lower Body Dressing/Undressing Lower body dressing/undressing steps patient completed: Thread/unthread right underwear leg, Thread/unthread left underwear leg, Pull underwear up/down, Thread/unthread right pants leg, Thread/unthread left pants leg, Pull pants up/down, Don/Doff right sock, Don/Doff left sock, Don/Doff right shoe, Fasten/unfasten left shoe, Fasten/unfasten right shoe, Don/Doff left shoe Lower body dressing/undressing: 5: Set-up assist to: Obtain clothing  FIM - Toileting Toileting steps completed by patient: Adjust clothing prior to toileting, Performs perineal hygiene, Adjust clothing after  toileting Toileting Assistive Devices: Grab bar or rail for  support Toileting: 7: Independent: No helper, no device  FIM - Diplomatic Services operational officerToilet Transfers Toilet Transfers Assistive Devices: Grab bars Toilet Transfers: 7-To toilet/BSC, 7-From toilet/BSC  FIM - BankerBed/Chair Transfer Bed/Chair Transfer Assistive Devices: Arm rests Bed/Chair Transfer: 6: Sit > Supine: No assist, 6: Supine > Sit: No assist, 6: Bed > Chair or W/C: No assist, 7: Bed > Chair or W/C: No assist  FIM - Locomotion: Wheelchair Locomotion: Wheelchair: 0: Activity did not occur FIM - Locomotion: Ambulation Locomotion: Ambulation Assistive Devices: Other (comment) (none) Ambulation/Gait Assistance: 6: Modified independent (Device/Increase time) Locomotion: Ambulation: 6: Travels 150 ft or more independently/takes more than reasonable amount of time  Comprehension Comprehension Mode: Auditory Comprehension: 3-Understands basic 50 - 74% of the time/requires cueing 25 - 50%  of the time  Expression Expression Mode: Verbal Expression: 3-Expresses basic 50 - 74% of the time/requires cueing 25 - 50% of the time. Needs to repeat parts of sentences.  Social Interaction Social Interaction Mode: Not assessed Social Interaction: 3-Interacts appropriately 50 - 74% of the time - May be physically or verbally inappropriate.  Problem Solving Problem Solving: 1-Solves basic less than 25% of the time - needs direction nearly all the time or does not effectively solve problems and may need a restraint for safety  Memory Memory: 1-Recognizes or recalls less than 25% of the time/requires cueing greater than 75% of the time  Medical Problem List and Plan: 1. Functional deficits secondary to TBI   2. DVT Prophylaxis/Anticoagulation: Pharmaceutical: Lovenox 3. Pain Management: Will continue oxycodone prn. Will likely need premedication as unable to express needs. Will monitor for now.  4. Mood: Unable to gauge at this time due to cognitive  deficits. LCSW to follow along for evaluation and support once more appropriate.  5. Neuropsych: This patient is not capable of making decisions on her own behalf. -continue in [place]ail for safety 6. Skin/Wound Care: pt is mobile 7. Fluids/Electrolytes/Nutrition:megace stoppped .    8. Agitation: confused but less agitated.   -seroquel  -klonopin and librium stopped due to lethargy  -increased depakote to 1000mg  bid---re-check level later in week or early next -sleep cycle overall better    -appreciate psych assessment and input--- 9. ?GERD/odynophagia/dysphagia--D2 diet per SLP- intake improving  -PEG placed and ? functioning-Contrast study ok per trauma- meeting fluid and caloric needs po, meds po  - Hold bolus feeds and flushes 10. RML pneumonia-resolved   11. Bowel and bladder: emptying   LOS (Days) 48 A FACE TO FACE EVALUATION WAS PERFORMED  Erick ColaceKIRSTEINS,ANDREW E 04/26/2014 8:49 AM

## 2014-04-26 NOTE — Progress Notes (Signed)
Physical Therapy Session Note  Patient Details  Name: Justin Pugh MRN: [id number] Date of Birth: 1955/03/20  Today's Date: 04/26/2014 PT Individual Time: 0800-0845 PT Individual Time Calculation (min): 45 min   Short Term Goals: Week 3:  PT Short Term Goal 1 (Week 3): STGs=LTGs  Skilled Therapeutic Interventions/Progress Updates:   Patient received sitting on bed, sitter present for session. Session focused on sustained attention, command following, awareness, and orientation. Patient perseverated on need to go to church and have fellowship throughout beginning of session but easily redirected to therapeutic tasks. Patient noted to have not eaten any breakfast on tray but reported he was not hungry because of his "100% organic liquid meals" and need to share meal tray with others. Patient oriented to day of week and month but with poor frustration tolerance to further orientation. Patient required total A for intellectual awareness. Patient instructed to retrieve horseshoes from therapy gym and take equipment to day room. Patient ambulated throughout rehab unit with mod I on tiled and carpeted surfaces, 2 x 150 ft. Patient sustained attention in minimally distracting environment to simple game of horseshoes for duration of game with no cues needed for game rules or turn taking (activity terminated by therapist) and patient able to recall score throughout with supervision. Seated at table, therapist engaged patient in more complex game of checkers in minimally distracting environment. Length of game limited by time constraints but patient able to sustain attention to set up and play of game x 10 minutes without need for redirection. Patient required no cues progressed to min questioning cues toward end of game for strategy and rule following with increased mental fatigue. Patient carried horseshoe equipment back to therapy gym and returned items where he had found them with supervision. Patient  appropriately asking to get a cup of coffee, therefore ambulated back to family room and prepared coffee with supervision before returning to room. Patient thanked therapist at end of session and left sitting on bed with sitter present.   Therapy Documentation Precautions:  Precautions Precautions: Fall Precaution Comments: Elopement risk, agitation, PEG Restrictions Weight Bearing Restrictions: No Pain: Pain Assessment Pain Assessment: No/denies pain Locomotion : Ambulation Ambulation/Gait Assistance: 6: Modified independent (Device/Increase time)   See FIM for current functional status  Therapy/Group: Individual Therapy  [name], [name] 04/26/2014, 8:47 AM

## 2014-04-27 ENCOUNTER — Inpatient Hospital Stay (HOSPITAL_COMMUNITY): Payer: Medicaid Other | Admitting: Speech Pathology

## 2014-04-27 ENCOUNTER — Inpatient Hospital Stay (HOSPITAL_COMMUNITY): Payer: Medicaid Other | Admitting: *Deleted

## 2014-04-27 ENCOUNTER — Inpatient Hospital Stay (HOSPITAL_COMMUNITY): Payer: Medicaid Other

## 2014-04-27 MED ORDER — FREE WATER
50.0000 mL | Freq: Two times a day (BID) | Status: DC
  Administered 2014-04-27 – 2014-05-07 (×12): 50 mL

## 2014-04-27 NOTE — Progress Notes (Signed)
Speech Language Pathology Daily Session Note  Patient Details  Name: Katrine CohoJeffrey L Rundle MRN: [id number] Date of Birth: 20-Oct-1955  Today's Date: 04/27/2014 SLP Individual Time: 0800-0830 SLP Individual Time Calculation (min): 30 min  Short Term Goals: Week 7: SLP Short Term Goal 1 (Week 7): Patient will orient to time, place and situation with Max A multimodal cues.  SLP Short Term Goal 2 (Week 7): Patient will demonstrate sustained attention to functional task for 2 minutes with Max A multimodal cues.  SLP Short Term Goal 3 (Week 7): Patient will demonstrate efficient mastication with trials of Dys. 2  textures with minimal overt s/s of aspiration with Min A multimodal cues. SLP Short Term Goal 4 (Week 7): Patient will consume trials of thin liquids with minimal overt s/s of aspiration with supervision multimodal cues for use of swallowing strategies.   Skilled Therapeutic Interventions: Skilled treatment session focused on addressing dysphagia goals. SLP facilitated session with Supervision question cues to problem solve tray set up. Patient consumed tray of Dys. 2 textures with thin liquids without overt s/s of aspiration and required intermittent verbal cues for use of small bites. Patient requested  bread with meals, therefore, trials of Dys. 3 textures were administered. Patient demonstrated efficient mastication without overt s/s of aspiration. Recommend patient upgrade to Dys. 3 textures and continue thin liquids with full supervision for safety. Patient also demonstrated divided attention between self-feeding and conversation with supervision verbal cues for 30 minutes.  Overall, patient demonstrated less confabulation and increased appropriate social interaction with decreased verbosity.  Patient left sitting EOB with sitter present. Continue with current plan of care.    FIM:  Comprehension Comprehension Mode: Auditory Comprehension: 3-Understands basic 50 - 74% of the time/requires cueing  25 - 50%  of the time Expression Expression Mode: Verbal Expression: 3-Expresses basic 50 - 74% of the time/requires cueing 25 - 50% of the time. Needs to repeat parts of sentences. Social Interaction Social Interaction: 3-Interacts appropriately 50 - 74% of the time - May be physically or verbally inappropriate. Problem Solving Problem Solving: 1-Solves basic less than 25% of the time - needs direction nearly all the time or does not effectively solve problems and may need a restraint for safety Memory Memory: 1-Recognizes or recalls less than 25% of the time/requires cueing greater than 75% of the time FIM - Eating Eating Activity: 5: Supervision/cues  Pain Pain Assessment Pain Assessment: No/denies pain  Therapy/Group: Individual Therapy  Gabbrielle Mcnicholas 04/27/2014, 9:34 AM

## 2014-04-27 NOTE — Progress Notes (Signed)
Physical Therapy Session Note  Patient Details  Name: Justin Pugh MRN: [id number] Date of Birth: [birth year]  Today's Date: 04/27/2014 PT Individual Time: 0930-1012 PT Individual Time Calculation (min): 42 min   Short Term Goals: Week 1:  PT Short Term Goal 1 (Week 1): Patient will perform functional ambulation < 150 ft in home and community environments with supervision.  PT Short Term Goal 2 (Week 1): Patient will negotiate flight of stairs with 1 rail and mod I.  PT Short Term Goal 3 (Week 1): Patient will sustain attention to therapeutic activity x 3 min with min cues.  PT Short Term Goal 4 (Week 1): Patient will demonstrate intellectual awareness in controlled environment with mod cues.   Skilled Therapeutic Interventions/Progress Updates:  Session focused on selective > divided attention, sequencing, problem solving, intellectual awareness, task completion, and dynamic balance. Patient at overall mod I level for functional mobility in controlled and home environments. Patient received in bathroom, required two cues after toileting to initiate hand hygiene and turn on water. In moderately distracting environment of ADL kitchen, patient performed basic multi-step cooking task (making pudding) to completion with increased time and max-total verbal cues for adherence to food preparation instructions on box and appropriate utilization of cookware/utensils. Patient read instructions aloud but demonstrated poor comprehension of directions and perseverated on "leaving pudding out," redirected with mod cues. Patient was unable to perform higher level problem solving of converting pints to cups and repeatedly dismissed therapist's instructions. Patient was able to divide attention between conversation with therapists and mixing pudding ingredients. During cognitive rest break after kitchen task, patient perseverated on "going to class" despite max verbal cues to redirect but was eventually redirection  to new physical task. In therapy gym, patient performed agility floor ladder alternating feet with distant supervision but was unable to complete sequencing of SL hopping with max verbal cues and visual demonstration. Patient left in room on bed with sitter present.   Therapy Documentation Precautions:  Precautions Precautions: Fall Precaution Comments: Elopement risk, agitation, PEG Restrictions Weight Bearing Restrictions: No Pain: Pain Assessment Pain Assessment: No/denies pain Pain Score: 0-No pain Locomotion : Ambulation Ambulation/Gait Assistance: 6: Modified independent (Device/Increase time)   See FIM for current functional status  Therapy/Group: Individual Therapy  [name], Kashayla Ungerer A 04/27/2014, 10:38 AM
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
## 2014-04-27 NOTE — Progress Notes (Signed)
Occupational Therapy Session Note  Patient Details  Name: Justin Pugh MRN: [id number] Date of Birth: 09/22/1955  Today's Date: 04/27/2014 OT Individual Time: 0700-0745 OT Individual Time Calculation (min): 45 min    Short Term Goals: Week 1:  OT Short Term Goal 1 (Week 1): Pt will initiate 1 self-care task with max cues OT Short Term Goal 1 - Progress (Week 1): Met OT Short Term Goal 2 (Week 1): Pt will demonstrate sustained attention to functional task for 2 min with max cues OT Short Term Goal 2 - Progress (Week 1): Not met OT Short Term Goal 3 (Week 1): Pt will conisstently be orientated x2 with max cues OT Short Term Goal 3 - Progress (Week 1): Met OT Short Term Goal 4 (Week 1): Pt will remain in room with enclosure bed open for 15 min without cues for redirection  OT Short Term Goal 4 - Progress (Week 1): Progressing toward goal  Skilled Therapeutic Interventions/Progress Updates:    Pt seen for 1:1 OT session with focus on sequencing, organization, standing balance, orientation, sustained attention, and safety. Pt received supine in bed with sitter present. Pt oriented to person, place, and time with min cues. Pt required total A for orientation to situation and intellectual awareness. Pt very pleasant and cooperative this AM, agreeable to shower with increased time secondary to pt reporting being "cold." Pt retrieved bathing and dressing needs with mod cues for attention and organization. Completed bathing at shower level in standing with min cues to use soap. Pt required max cues for problem solving while drying off and dressing as pt attempting to dry body with water turned on due to being cold. Therapist encouraged pt to dry and get dressed in shower with water off to keep heat in. Pt agreeable and completed task with improved safety as he initiated use of grab bars for balance. Pt returned to room and left sitting EOB with sitter present. Pt with no signs of agitation during therapy  session. Pt hypervebal and confabulating while in room, however, pt sustained attention to bathing and dressing task for 15 min with min cues.  Therapy Documentation Precautions:  Precautions Precautions: Fall Precaution Comments: Elopement risk, agitation, PEG Restrictions Weight Bearing Restrictions: No General:   Vital Signs: Therapy Vitals Temp: 97.9 F (36.6 C) Temp Source: Oral Pulse Rate: 62 Resp: 18 BP: 105/62 mmHg Oxygen Therapy SpO2: 100 % O2 Device: Not Delivered Pain:   ADL:   Exercises:   Other Treatments:    See FIM for current functional status  Therapy/Group: Individual Therapy  ,  N 04/27/2014, 8:18 AM  

## 2014-04-27 NOTE — Progress Notes (Signed)
[place] PHYSICAL MEDICINE & REHABILITATION     PROGRESS NOTE    Subjective/Complaints: Questions about g tube over weekend. No leakage apparently. Otherwise stable. Confused, less agitated  ROS limited due to cognition  Objective: Vital Signs: Blood pressure 105/62, pulse 62, temperature 97.9 F (36.6 C), temperature source Oral, resp. rate 18, height 6' (1.829 m), weight 83 kg (182 lb 15.7 oz), SpO2 100 %. Dg Abd Portable 1v  04/25/2014   CLINICAL DATA:  Percutaneous endoscopic gastrostomy tube placement. Evaluate placement.  EXAM: PORTABLE ABDOMEN - 1 VIEW  COMPARISON:  03/02/2014 radiographs  FINDINGS: Water-soluble contrast was injected through the patient's G-tube.  Single frontal view of the abdomen demonstrates contrast within the stomach and extending into the duodenum.  There is no definite evidence of contrast extravasation.  IMPRESSION: Gastrostomy tube within the stomach. No evidence of extraluminal contrast extravasation   Electronically Signed   By: Harmon PierJeffrey  Hu M.D.   On: 04/25/2014 14:59   No results for input(s): WBC, HGB, HCT, PLT in the last 72 hours. No results for input(s): NA, K, CL, GLUCOSE, BUN, CREATININE, CALCIUM in the last 72 hours.  Invalid input(s): CO CBG (last 3)  No results for input(s): GLUCAP in the last 72 hours.  Wt Readings from Last 3 Encounters:  04/27/14 83 kg (182 lb 15.7 oz)  03/09/14 84.188 kg (185 lb 9.6 oz)  01/23/14 95.255 kg (210 lb)    Physical Exam:  Constitutional: He appears well-developed and well-nourished. He is sleeping. He is easily aroused.  HENT:  Head: Normocephalic.  Neck: supple Cardiovascular: Normal rate and regular rhythm.  Respiratory: Effort normal. No respiratory distress.  GI: Soft. Bowel sounds are normal. He exhibits no distension. There is mild abdominal tenderness, peg site a little red, no drainage or tenderness Musculoskeletal: He exhibits no edema.  Neurological: He is alert today.  Phonation  improved. A little hoarse  restless.   Moves all extremities without difficulty. equally moves all 4's. Senses pain in all 4. Oriented to name only. confabulates Skin: Skin is warm and dry.  Psychiatric:  Confused   Assessment/Plan: 1. 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 Physiatrist is providing close team supervision and 24 hour management of active medical problems listed below. Physiatrist and rehab team continue to assess barriers to discharge/monitor patient progress toward functional and medical goals. Appreciate trauma note, contrast seen in stomach and duodenum although RN states there is some leakage around stoma FIM: FIM - Bathing Bathing Steps Patient Completed: Chest, Abdomen, Front perineal area, Right Arm, Left Arm, Buttocks, Right upper leg, Left upper leg, Left lower leg (including foot), Right lower leg (including foot) Bathing: 5: Supervision: Safety issues/verbal cues  FIM - Upper Body Dressing/Undressing Upper body dressing/undressing steps patient completed: Thread/unthread right sleeve of pullover shirt/dresss, Thread/unthread left sleeve of pullover shirt/dress, Put head through opening of pull over shirt/dress, Pull shirt over trunk Upper body dressing/undressing: 5: Supervision: Safety issues/verbal cues FIM - Lower Body Dressing/Undressing Lower body dressing/undressing steps patient completed: Thread/unthread right underwear leg, Thread/unthread left underwear leg, Pull underwear up/down, Thread/unthread right pants leg, Thread/unthread left pants leg, Pull pants up/down, Don/Doff right sock, Don/Doff left sock, Don/Doff right shoe, Fasten/unfasten left shoe, Fasten/unfasten right shoe, Don/Doff left shoe Lower body dressing/undressing: 5: Set-up assist to: Obtain clothing  FIM - Toileting Toileting steps completed by patient: Adjust clothing prior to  toileting, Performs perineal hygiene, Adjust clothing after toileting  Toileting Assistive Devices: Grab bar or rail for support Toileting: 7: Independent: No helper, no device  FIM - Diplomatic Services operational officer Devices: Grab bars Toilet Transfers: 6-To toilet/ BSC, 6-From toilet/BSC  FIM - Banker Devices: Arm rests Bed/Chair Transfer: 6: Supine > Sit: No assist, 6: Sit > Supine: No assist, 6: Bed > Chair or W/C: No assist  FIM - Locomotion: Wheelchair Locomotion: Wheelchair: 0: Activity did not occur FIM - Locomotion: Ambulation Locomotion: Ambulation Assistive Devices: Other (comment) (none) Ambulation/Gait Assistance: 6: Modified independent (Device/Increase time) Locomotion: Ambulation: 6: Travels 150 ft or more independently/takes more than reasonable amount of time  Comprehension Comprehension Mode: Auditory Comprehension: 3-Understands basic 50 - 74% of the time/requires cueing 25 - 50%  of the time  Expression Expression Mode: Verbal Expression: 3-Expresses basic 50 - 74% of the time/requires cueing 25 - 50% of the time. Needs to repeat parts of sentences.  Social Interaction Social Interaction Mode: Not assessed Social Interaction: 3-Interacts appropriately 50 - 74% of the time - May be physically or verbally inappropriate.  Problem Solving Problem Solving: 1-Solves basic less than 25% of the time - needs direction nearly all the time or does not effectively solve problems and may need a restraint for safety  Memory Memory: 1-Recognizes or recalls less than 25% of the time/requires cueing greater than 75% of the time  Medical Problem List and Plan: 1. Functional deficits secondary to TBI   2. DVT Prophylaxis/Anticoagulation: Pharmaceutical: Lovenox 3. Pain Management: Will continue oxycodone prn. Will likely need premedication as unable to express needs. Will monitor for now.  4. Mood: Unable to gauge at this  time due to cognitive deficits. LCSW to follow along for evaluation and support once more appropriate.  5. Neuropsych: This patient is not capable of making decisions on her own behalf. -keeping vail bed open 6. Skin/Wound Care: pt is mobile 7. Fluids/Electrolytes/Nutrition:megace stoppped .    8. Agitation: confused but less agitated.   -seroquel  -klonopin and librium stopped due to lethargy  -increased depakote to  bid---re-check level later in week or early next -sleep cycle overall better  -check labs tomorrow  -appreciate psych assessment and input--- 9. ?GERD/odynophagia/dysphagia--D2 diet per SLP- intake improving  -WJX:BJYNWGNF study ok per trauma- meeting fluid and caloric needs po, meds po  - Holding bolus feeds and flushes  -tube placed 2/15 10. RML pneumonia-resolved   11. Bowel and bladder: emptying   LOS (Days) 49 A FACE TO FACE EVALUATION WAS PERFORMED  Elizbeth Posa T 04/27/2014 7:46 AM

## 2014-04-28 ENCOUNTER — Inpatient Hospital Stay (HOSPITAL_COMMUNITY): Payer: Medicaid Other | Admitting: *Deleted

## 2014-04-28 ENCOUNTER — Inpatient Hospital Stay (HOSPITAL_COMMUNITY): Payer: Medicaid Other | Admitting: Speech Pathology

## 2014-04-28 ENCOUNTER — Inpatient Hospital Stay (HOSPITAL_COMMUNITY): Payer: Medicaid Other

## 2014-04-28 LAB — COMPREHENSIVE METABOLIC PANEL
ALT: 8 U/L (ref 0–53)
AST: 12 U/L (ref 0–37)
Albumin: 2.8 g/dL — ABNORMAL LOW (ref 3.5–5.2)
Alkaline Phosphatase: 44 U/L (ref 39–117)
Anion gap: 7 (ref 5–15)
BUN: 6 mg/dL (ref 6–23)
CO2: 29 mmol/L (ref 19–32)
CREATININE: 0.68 mg/dL (ref 0.50–1.35)
Calcium: 9.3 mg/dL (ref 8.4–10.5)
Chloride: 104 mmol/L (ref 96–112)
GFR calc Af Amer: >90 mL/min (ref >90)
Glucose, Bld: 94 mg/dL (ref 70–99)
Potassium: 4 mmol/L (ref 3.5–5.1)
Sodium: 140 mmol/L (ref 135–145)
Total Bilirubin: 0.4 mg/dL (ref 0.3–1.2)
Total Protein: 6 g/dL (ref 6.0–8.3)

## 2014-04-28 LAB — CBC
HEMATOCRIT: 36.4 % — AB (ref 39.0–52.0)
HEMOGLOBIN: 11.7 g/dL — AB (ref 13.0–17.0)
MCH: 29.9 pg (ref 26.0–34.0)
MCHC: 32.1 g/dL (ref 30.0–36.0)
MCV: 93.1 fL (ref 78.0–100.0)
Platelets: 273 10*3/uL (ref 150–400)
RBC: 3.91 MIL/uL — ABNORMAL LOW (ref 4.22–5.81)
RDW: 13.9 % (ref 11.5–15.5)
WBC: 5.4 10*3/uL (ref 4.0–10.5)

## 2014-04-28 LAB — VALPROIC ACID LEVEL: Valproic Acid Lvl: 45.7 ug/mL — ABNORMAL LOW (ref 50.0–100.0)

## 2014-04-28 MED ORDER — POLYETHYLENE GLYCOL 3350 17 G PO PACK
17.0000 g | PACK | Freq: Every day | ORAL | Status: DC
  Administered 2014-04-30 – 2014-05-22 (×23): 17 g
  Filled 2014-04-28 (×25): qty 1

## 2014-04-28 MED ORDER — VALPROIC ACID 250 MG PO CAPS
1000.0000 mg | ORAL_CAPSULE | Freq: Two times a day (BID) | ORAL | Status: DC
Start: 2014-04-28 — End: 2014-05-22
  Administered 2014-04-28 – 2014-05-22 (×48): 1000 mg via ORAL
  Filled 2014-04-28 (×51): qty 4

## 2014-04-28 MED ORDER — PANTOPRAZOLE SODIUM 40 MG PO TBEC
40.0000 mg | DELAYED_RELEASE_TABLET | Freq: Two times a day (BID) | ORAL | Status: DC
  Administered 2014-04-28 – 2014-05-22 (×48): 40 mg via ORAL
  Filled 2014-04-28 (×48): qty 1

## 2014-04-28 NOTE — Progress Notes (Signed)
Occupational Therapy Session Note  Patient Details  Name: Justin Pugh MRN: [id number] Date of Birth: [birth year]  Today's Date: 04/28/2014 OT Individual Time: 0700-0745 OT Individual Time Calculation (min): 45 min    Short Term Goals: Week 1:  OT Short Term Goal 1 (Week 1): Pt will initiate 1 self-care task with max cues OT Short Term Goal 1 - Progress (Week 1): Met OT Short Term Goal 2 (Week 1): Pt will demonstrate sustained attention to functional task for 2 min with max cues OT Short Term Goal 2 - Progress (Week 1): Not met OT Short Term Goal 3 (Week 1): Pt will conisstently be orientated x2 with max cues OT Short Term Goal 3 - Progress (Week 1): Met OT Short Term Goal 4 (Week 1): Pt will remain in room with enclosure bed open for 15 min without cues for redirection  OT Short Term Goal 4 - Progress (Week 1): Progressing toward goal  Skilled Therapeutic Interventions/Progress Updates:    Pt seen for 1:1 OT session with focus on standing balance, path finding, orientation, intellectual awareness, sustained attention, and organization. Pt received supine in bed orientated to month, year, place, and person. Pt required total A for orientation to situation and verbalized having difficulty with "memory" however attributed it to his age. Retrieved clothing items with therapist providing mod cues for organization. Ambulated to ADL apartment with min cues for path finding. Pt completed bathing and dressing with setup assist-Mod I (in controlled environment) and pt sustaining attention for 15 min without cues. Pt ambulated family room to retrieve coffee and back to room with no cues for path finding. Upon return, pt completed grooming tasks in standing then returned to sitting EOB to drink coffee. Pt left with sitter present.  Therapy Documentation Precautions:  Precautions Precautions: Fall Precaution Comments: Elopement risk, agitation, PEG Restrictions Weight Bearing Restrictions:  No General:   Vital Signs: Therapy Vitals Temp: 97.9 F (36.6 C) Temp Source: Oral Pulse Rate: 63 Resp: 18 BP: 103/73 mmHg Patient Position (if appropriate): Sitting Oxygen Therapy SpO2: 100 % O2 Device: Not Delivered Pain: No report of pain  See FIM for current functional status  Therapy/Group: Individual Therapy  Duayne Cal 04/28/2014, 9:02 AM

## 2014-04-28 NOTE — Progress Notes (Signed)
[place] PHYSICAL MEDICINE & REHABILITATION     PROGRESS NOTE    Subjective/Complaints: Comfortable sitting in bed. Waiting to get up to do "exercises"  ROS--still somewhat limited due to cognition, denies pain, sob, cough  Objective: Vital Signs: Blood pressure 103/73, pulse 63, temperature 97.9 F (36.6 C), temperature source Oral, resp. rate 18, height 6' (1.829 m), weight 81.194 kg (179 lb), SpO2 100 %. No results found.  Recent Labs  04/28/14 0623  WBC 5.4  HGB 11.7*  HCT 36.4*  PLT 273   No results for input(s): NA, K, CL, GLUCOSE, BUN, CREATININE, CALCIUM in the last 72 hours.  Invalid input(s): CO CBG (last 3)  No results for input(s): GLUCAP in the last 72 hours.  Wt Readings from Last 3 Encounters:  04/28/14 81.194 kg (179 lb)  03/09/14 84.188 kg (185 lb 9.6 oz)  01/23/14 95.255 kg (210 lb)    Physical Exam:  Constitutional: He appears well-developed and well-nourished. He is sleeping. He is easily aroused.  HENT:  Head: Normocephalic.  Neck: supple Cardiovascular: Normal rate and regular rhythm.  Respiratory: Effort normal. No respiratory distress.  GI: Soft. Bowel sounds are normal. He exhibits no distension. There is mild abdominal tenderness, peg site a little red, no drainage or tenderness Musculoskeletal: He exhibits no edema.  Neurological: He is alert today.  Phonation improved. A little hoarse  restless.   Moves all extremities without difficulty. equally moves all 4's. Senses pain in all 4. Oriented to name only. confabulates Skin: Skin is warm and dry.  Psychiatric:  Confused but more appropriate   Assessment/Plan: 1. 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 Physiatrist is providing close team supervision and 24 hour management of active medical problems listed below. Physiatrist and rehab team continue to assess barriers to  discharge/monitor patient progress toward functional and medical goals. Appreciate trauma note, contrast seen in stomach and duodenum although RN states there is some leakage around stoma FIM: FIM - Bathing Bathing Steps Patient Completed: Chest, Abdomen, Front perineal area, Right Arm, Left Arm, Buttocks, Right upper leg, Left upper leg, Left lower leg (including foot), Right lower leg (including foot) Bathing: 5: Supervision: Safety issues/verbal cues  FIM - Upper Body Dressing/Undressing Upper body dressing/undressing steps patient completed: Thread/unthread right sleeve of pullover shirt/dresss, Thread/unthread left sleeve of pullover shirt/dress, Put head through opening of pull over shirt/dress, Pull shirt over trunk Upper body dressing/undressing: 5: Supervision: Safety issues/verbal cues FIM - Lower Body Dressing/Undressing Lower body dressing/undressing steps patient completed: Thread/unthread right underwear leg, Thread/unthread left underwear leg, Pull underwear up/down, Thread/unthread right pants leg, Thread/unthread left pants leg, Pull pants up/down, Don/Doff right sock, Don/Doff left sock, Don/Doff right shoe, Fasten/unfasten left shoe, Fasten/unfasten right shoe, Don/Doff left shoe Lower body dressing/undressing: 5: Supervision: Safety issues/verbal cues  FIM - Toileting Toileting steps completed by patient: Adjust clothing prior to toileting, Performs perineal hygiene, Adjust clothing after toileting Toileting Assistive Devices: Grab bar or rail for support Toileting: 0: Activity did not occur  FIM - Diplomatic Services operational officerToilet Transfers Toilet Transfers Assistive Devices: Grab bars Toilet Transfers: 6-To toilet/ BSC, 6-From toilet/BSC  FIM - BankerBed/Chair Transfer Bed/Chair Transfer Assistive Devices: Arm rests Bed/Chair Transfer: 6: Supine > Sit: No assist, 6: Sit > Supine: No assist, 6: Bed > Chair or W/C: No assist, 6: Chair or W/C > Bed: No assist  FIM - Locomotion: Wheelchair Locomotion:  Wheelchair: 0: Activity did not occur FIM -  Locomotion: Ambulation Locomotion: Ambulation Assistive Devices: Other (comment) (none) Ambulation/Gait Assistance: 6: Modified independent (Device/Increase time) Locomotion: Ambulation: 6: Travels 150 ft or more independently/takes more than reasonable amount of time  Comprehension Comprehension Mode: Auditory Comprehension: 3-Understands basic 50 - 74% of the time/requires cueing 25 - 50%  of the time  Expression Expression Mode: Verbal Expression: 3-Expresses basic 50 - 74% of the time/requires cueing 25 - 50% of the time. Needs to repeat parts of sentences.  Social Interaction Social Interaction Mode: Not assessed Social Interaction: 3-Interacts appropriately 50 - 74% of the time - May be physically or verbally inappropriate.  Problem Solving Problem Solving: 1-Solves basic less than 25% of the time - needs direction nearly all the time or does not effectively solve problems and may need a restraint for safety  Memory Memory: 1-Recognizes or recalls less than 25% of the time/requires cueing greater than 75% of the time  Medical Problem List and Plan: 1. Functional deficits secondary to TBI   2. DVT Prophylaxis/Anticoagulation: Pharmaceutical: Lovenox 3. Pain Management: Will continue oxycodone prn. Will likely need premedication as unable to express needs. Will monitor for now.  4. Mood: Unable to gauge at this time due to cognitive deficits. LCSW to follow along for evaluation and support once more appropriate.  5. Neuropsych: This patient is not capable of making decisions on her own behalf. -out of vail bed 6. Skin/Wound Care: pt is mobile 7. Fluids/Electrolytes/Nutrition:megace stoppped .    8. Agitation: confused but less agitated.   -seroquel  -klonopin and librium stopped due to lethargy  -increased depakote to  bid---level pending -sleep cycle overall better  -labs  pending for today  -appreciate psych assessment and input--- 9. ?GERD/odynophagia/dysphagia--D2 diet per SLP- intake improved--no aspiration.  -XBM:WUXLKG Contrast study ok per trauma-    - Holding bolus feeds and flushes  -tube placed 2/15 10. RML pneumonia-resolved   11. Bowel and bladder: emptying   LOS (Days) 50 A FACE TO FACE EVALUATION WAS PERFORMED  SWARTZ,ZACHARY T 04/28/2014 7:37 AM

## 2014-04-28 NOTE — Progress Notes (Signed)
Physical Therapy Session Note  Patient Details  Name: Justin Pugh MRN: [id number] Date of Birth: 02/12/1956  Today's Date: 04/28/2014 PT Individual Time: 0930-1015 PT Individual Time Calculation (min): 45 min   Short Term Goals: Week 1:  PT Short Term Goal 1 (Week 1): Patient will perform functional ambulation < 150 ft in home and community environments with supervision.  PT Short Term Goal 2 (Week 1): Patient will negotiate flight of stairs with 1 rail and mod I.  PT Short Term Goal 3 (Week 1): Patient will sustain attention to therapeutic activity x 3 min with min cues.  PT Short Term Goal 4 (Week 1): Patient will demonstrate intellectual awareness in controlled environment with mod cues.   Skilled Therapeutic Interventions/Progress Updates:   Session focused on attention, initiation, task completion, problem solving, and dynamic balance. Patient received asleep in bed, easily awakened and agreeable to therapy. Patient required max multimodal cues for utilization of schedule to recall completion of previous therapy sessions and awareness of participation in current therapy session. Patient participated in structured task of bowling in day room. Patient retrieved cup of coffee with supervision and required max cues for redirection to initiation of game play. Patient required overall min cues for setup, turn taking, adhering to game rules, and recording score. Patient required max multimodal cues and increased time to add final score at end of game due to possible mental fatigue, decreased attention, verbal perseveration on number "8" with poor frustration tolerance to higher level problem solving task noted by gritting teeth and cursing but easily redirected. Patient required cues to recall correct location to return bowling equipment. Upon ambulating back toward room, patient initiated retrieving third cup of coffee for the morning but was easily redirected to room. Patient left sitting on bed  with sitter present.    Therapy Documentation Precautions:  Precautions Precautions: Fall Precaution Comments: Elopement risk, agitation, PEG Restrictions Weight Bearing Restrictions: No Pain: Pain Assessment Pain Assessment: No/denies pain Pain Score: 0-No pain Locomotion : Ambulation Ambulation/Gait Assistance: 6: Modified independent (Device/Increase time)   See FIM for current functional status  Therapy/Group: Individual Therapy  [name], Bartolo Montanye A 04/28/2014, 11:01 AM

## 2014-04-28 NOTE — Progress Notes (Signed)
Speech Language Pathology Daily Session Note  Patient Details  Name: Katrine CohoJeffrey L Fager MRN: [id number] Date of Birth: 1956/01/10  Today's Date: 04/28/2014 SLP Individual Time: 0800-0840 SLP Individual Time Calculation (min): 40 min  Short Term Goals: Week 7: SLP Short Term Goal 1 (Week 7): Patient will orient to time, place and situation with Max A multimodal cues.  SLP Short Term Goal 2 (Week 7): Patient will demonstrate sustained attention to functional task for 2 minutes with Max A multimodal cues.  SLP Short Term Goal 3 (Week 7): Patient will demonstrate efficient mastication with trials of Dys. 2  textures with minimal overt s/s of aspiration with Min A multimodal cues. SLP Short Term Goal 4 (Week 7): Patient will consume trials of thin liquids with minimal overt s/s of aspiration with supervision multimodal cues for use of swallowing strategies.   Skilled Therapeutic Interventions: Skilled treatment session focused on addressing dysphagia goals. SLP facilitated session with Supervision question cues to problem solve tray set up. Patient consumed tray of Dys. 2 textures with thin liquids without overt s/s of aspiration and required intermittent verbal cues for use of small bites. Patient demonstrated divided attention between self-feeding and conversation with supervision verbal cues for 30 minutes.  Patient also required Max A multimodal cues for utilization of schedule to recall previous therapy session and anticipate upcoming sessions as well as for orientation. Patient left in room with sitter present. Continue with current plan of care.     FIM:  Comprehension Comprehension Mode: Auditory Comprehension: 3-Understands basic 50 - 74% of the time/requires cueing 25 - 50%  of the time Expression Expression Mode: Verbal Expression: 3-Expresses basic 50 - 74% of the time/requires cueing 25 - 50% of the time. Needs to repeat parts of sentences. Social Interaction Social Interaction:  3-Interacts appropriately 50 - 74% of the time - May be physically or verbally inappropriate. Problem Solving Problem Solving: 1-Solves basic less than 25% of the time - needs direction nearly all the time or does not effectively solve problems and may need a restraint for safety Memory Memory: 1-Recognizes or recalls less than 25% of the time/requires cueing greater than 75% of the time FIM - Eating Eating Activity: 5: Supervision/cues  Pain Pain Assessment Pain Assessment: No/denies pain  Therapy/Group: Individual Therapy  Endiya Klahr 04/28/2014, 2:51 PM
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
## 2014-04-28 NOTE — Plan of Care (Signed)
Problem: RH SAFETY Goal: RH STG ADHERE TO SAFETY PRECAUTIONS W/ASSISTANCE/DEVICE STG Adhere to Safety Precautions With min Assistance/Device.  Outcome: Not Progressing Requires sitter for safety

## 2014-04-29 ENCOUNTER — Inpatient Hospital Stay (HOSPITAL_COMMUNITY): Payer: Medicaid Other | Admitting: Speech Pathology

## 2014-04-29 ENCOUNTER — Inpatient Hospital Stay (HOSPITAL_COMMUNITY): Payer: Medicaid Other | Admitting: *Deleted

## 2014-04-29 ENCOUNTER — Inpatient Hospital Stay (HOSPITAL_COMMUNITY): Payer: Self-pay

## 2014-04-29 NOTE — Progress Notes (Signed)
Occupational Therapy Weekly Progress Note  Patient Details  Name: Justin Pugh MRN: [id number] Date of Birth: August 13, 1955  Beginning of progress report period: April 22, 2014 End of progress report period: April 29, 2014  Today's Date: 04/29/2014 OT Individual Time: 8099-8338 OT Individual Time Calculation (min): 45 min    Patient has met 3 of 3 short term goals.  Patient has made some cognitive gains during this reporting period. Patient demonstrates improved sustained attention to self-care tasks and requires min-mod cues for organization for self-care activities. Patient requires mod-max cues for sustained attention to other structured activities as patient is hyperverbal and confabulates. Patient is consistently orientated to person, place, year, and month without cues, however continues to demonstrate poor intellectual awareness. Patient is currently mod I in controlled environment for functional transfers and mobility. Patient has demonstrated much improved behaviors, as he has not demonstrated any agitation during therapy sessions. Patient has also progressed from enclosure bed and remains in room with sitter present. Plans this week to discharge sitter. Patient is currently demonstrating behaviors consistent with Rancho Level V  Patient continues to demonstrate the following deficits: decreased safety awareness, decreased balance strategies, decreased attention, decreased intellectual awareness, decreased emergent awareness, absent anticipatory awareness, decreased memory, decreased problem solving, decreased sequencing, decreased organization, decreased activity tolerance and therefore will continue to benefit from skilled OT intervention to enhance overall performance with cognitive remediation, attetion, BADLs, activity tolerance, and safety.  Patient progressing toward long term goals..  Continue plan of care.  OT Short Term Goals Week 4:  OT Short Term Goal 1 (Week 4): Pt will  participate in 45 min therapy session without verbal or phyical agitation  OT Short Term Goal 1 - Progress (Week 4): Met OT Short Term Goal 2 (Week 4): Pt will identify 1 physical or cognitive deficit with max cues OT Short Term Goal 2 - Progress (Week 4): Met OT Short Term Goal 3 (Week 4): Pt will be oriented x3 with max cues OT Short Term Goal 3 - Progress (Week 4): Met Week 5:  OT Short Term Goal 1 (Week 5): Pt will demonstrate sustained attention to structured task (not ADL) for 10 min with mod cues  OT Short Term Goal 2 (Week 5): Pt will recall 3 activities completed in previous therapy session with mod cues OT Short Term Goal 3 (Week 5): Pt will demonstrate improve safety and remain in room with external cues and no sitter between therapy sessions 3/7 days   Skilled Therapeutic Interventions/Progress Updates:    Pt seen for 1:1 OT session with focus on sustained attention, problem solving, orientation, and overall cognitive remediation. Pt received supine in bed asleep, easily aroused. Pt oriented to place, month, year, and person and required total A for orientation to situation and intellectual awareness. Pt retrieved clothing items with min cues then completed laundry task with cues only for operating new machine. Pt chose correct setting for wash cycle without cues. Ambulated to family room to retrieve hot chocolate and back to room without cues for path finding. Engaged in therapeutic conversation while pt drinking hot chocolate. Pt required mod cues for accurately reading schedule as pt internally distracted, hyperverbal, and confabulating. Ambulated to gym and pt sustained attention to structured task for 3 min while retrieving numbered circles around gym in numerical order with 100% accuracy. Provided fishing tackle box and pt identified all items and how they are used with min questioning cues. Pt returned to room and left sitting EOB with sitter  present.  Therapy  Documentation Precautions:  Precautions Precautions: Fall Precaution Comments: Elopement risk, agitation, PEG Restrictions Weight Bearing Restrictions: No General:   Vital Signs: Therapy Vitals Temp: 98.1 F (36.7 C) Temp Source: Oral Pulse Rate: 73 Resp: 18 BP: (!) 103/55 mmHg Patient Position (if appropriate): Lying Oxygen Therapy SpO2: 100 % O2 Device: Not Delivered Pain: No report of pain  See FIM for current functional status  Therapy/Group: Individual Therapy  Duayne Cal 04/29/2014, 8:47 AM

## 2014-04-29 NOTE — Progress Notes (Signed)
[facility] PHYSICAL MEDICINE & REHABILITATION     PROGRESS NOTE    Subjective/Complaints: Quiet and comfortable today. Making coffee for staff.  ROS--still somewhat limited due to cognition, denies pain, sob, cough  Objective: Vital Signs: Blood pressure 103/55, pulse 73, temperature 98.1 F (36.7 C), temperature source Oral, resp. rate 18, height 6' (1.829 m), weight 82.4 kg (181 lb 10.5 oz), SpO2 100 %. No results found.  Recent Labs  04/28/14 0623  WBC 5.4  HGB 11.7*  HCT 36.4*  PLT 273    Recent Labs  04/28/14 0623  NA 140  K 4.0  CL 104  GLUCOSE 94  BUN 6  CREATININE 0.68  CALCIUM 9.3   CBG (last 3)  No results for input(s): GLUCAP in the last 72 hours.  Wt Readings from Last 3 Encounters:  04/29/14 82.4 kg (181 lb 10.5 oz)  03/09/14 84.188 kg (185 lb 9.6 oz)  01/23/14 95.255 kg (210 lb)    Physical Exam:  Constitutional: He appears well-developed and well-nourished. He is sleeping. He is easily aroused.  HENT:  Head: Normocephalic.  Neck: supple Cardiovascular: Normal rate and regular rhythm.  Respiratory: Effort normal. No respiratory distress.  GI: Soft. Bowel sounds are normal. He exhibits no distension. There is mild abdominal tenderness, peg site a little red, no drainage or tenderness Musculoskeletal: He exhibits no edema.  Neurological: He is alert today.        Moves all extremities without difficulty. equally moves all 4's. Senses pain in all 4. Oriented to name only. confabulates Skin: Skin is warm and dry.  Psychiatric:  Confused but more appropriate. Showing improved insight and awareness  Assessment/Plan: 1. 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 Physiatrist is providing close team supervision and 24 hour management of active medical problems listed below. Physiatrist and rehab team continue to assess barriers to  discharge/monitor patient progress toward functional and medical goals. Appreciate trauma note, contrast seen in stomach and duodenum although RN states there is some leakage around stoma FIM: FIM - Bathing Bathing Steps Patient Completed: Chest, Abdomen, Front perineal area, Right Arm, Left Arm, Buttocks, Right upper leg, Left upper leg, Left lower leg (including foot), Right lower leg (including foot) Bathing: 6: More than reasonable amount of time  FIM - Upper Body Dressing/Undressing Upper body dressing/undressing steps patient completed: Thread/unthread right sleeve of pullover shirt/dresss, Thread/unthread left sleeve of pullover shirt/dress, Put head through opening of pull over shirt/dress, Pull shirt over trunk Upper body dressing/undressing: 5: Set-up assist to: Obtain clothing/put away FIM - Lower Body Dressing/Undressing Lower body dressing/undressing steps patient completed: Thread/unthread right underwear leg, Thread/unthread left underwear leg, Pull underwear up/down, Thread/unthread right pants leg, Thread/unthread left pants leg, Pull pants up/down, Don/Doff right sock, Don/Doff left sock, Don/Doff right shoe, Fasten/unfasten left shoe, Fasten/unfasten right shoe, Don/Doff left shoe Lower body dressing/undressing: 5: Set-up assist to: Obtain clothing  FIM - Toileting Toileting steps completed by patient: Adjust clothing prior to toileting, Performs perineal hygiene, Adjust clothing after toileting Toileting Assistive Devices: Grab bar or rail for support Toileting: 0: Activity did not occur  FIM - Diplomatic Services operational officer Devices: Grab bars Toilet Transfers: 6-To toilet/ BSC, 6-From toilet/BSC  FIM - Banker Devices: Arm rests Bed/Chair Transfer: 6: Supine > Sit: No assist, 6: Bed > Chair or W/C: No assist, 6: Chair or W/C > Bed: No assist  FIM - Locomotion:  Wheelchair Locomotion: Wheelchair: 0: Activity did not  occur FIM - Locomotion: Ambulation Locomotion: Ambulation Assistive Devices: Other (comment) (none) Ambulation/Gait Assistance: 6: Modified independent (Device/Increase time) Locomotion: Ambulation: 6: Travels 150 ft or more independently/takes more than reasonable amount of time  Comprehension Comprehension Mode: Auditory Comprehension: 3-Understands basic 50 - 74% of the time/requires cueing 25 - 50%  of the time  Expression Expression Mode: Verbal Expression: 3-Expresses basic 50 - 74% of the time/requires cueing 25 - 50% of the time. Needs to repeat parts of sentences.  Social Interaction Social Interaction Mode: Not assessed Social Interaction: 3-Interacts appropriately 50 - 74% of the time - May be physically or verbally inappropriate.  Problem Solving Problem Solving: 1-Solves basic less than 25% of the time - needs direction nearly all the time or does not effectively solve problems and may need a restraint for safety  Memory Memory: 1-Recognizes or recalls less than 25% of the time/requires cueing greater than 75% of the time  Medical Problem List and Plan: 1. Functional deficits secondary to TBI   2. DVT Prophylaxis/Anticoagulation: Pharmaceutical: Lovenox 3. Pain Management: Will continue oxycodone prn. Will likely need premedication as unable to express needs. Will monitor for now.  4. Mood: Unable to gauge at this time due to cognitive deficits. LCSW to follow along for evaluation and support once more appropriate.  5. Neuropsych: This patient is not capable of making decisions on her own behalf. -out of [place] bed 6. Skin/Wound Care: pt is mobile 7. Fluids/Electrolytes/Nutrition:megace stoppped .    8. Agitation: confused but less agitated.   -seroquel  -klonopin and librium stopped due to lethargy  -depakote 1000 mg bid -sleeping well  -appreciate psych assessment and input--- 9. ?GERD/odynophagia/dysphagia--D2  diet per SLP- intake improved--no aspiration.  -EXB:[id number]:recent Contrast study ok per trauma-    - Holding bolus feeds and flushes  -tube placed 2/15--can remove potentially next week 10. RML pneumonia-resolved   11. Bowel and bladder: emptying   LOS (Days) 51 A FACE TO FACE EVALUATION WAS PERFORMED  Justin Pugh 04/29/2014 7:57 AM

## 2014-04-29 NOTE — Progress Notes (Signed)
Sitter discontinued. Patient moved to [phone] for better visual from nurses station. Educated patient on new safety process of staying in room by providing sheet at door with stop sign in place. Patient states understanding of new process but education is to continue q shift.

## 2014-04-29 NOTE — Progress Notes (Signed)
Physical Therapy Weekly Progress Note  Patient Details  Name: Justin Pugh MRN: [id number] Date of Birth: 1955-07-21  Beginning of progress report period: April 22, 2014 End of progress report period: April 29, 2014  Today's Date: 04/29/2014 PT Individual Time: 0930-1015 PT Individual Time Calculation (min): 45 min   Patient has met 2 of 4 short term goals.  Patient requires mod I for functional mobility in a controlled environment. Patient is currently demonstrating behaviors consistent with Rancho Level V. Patient demonstrates overall improvement in sustained attention, task completion, engagement in therapeutic tasks, redirectability, and consistently pleasant mood. Patient continues to require max-total A for intellectual awareness. Since last reporting period, patient has transitioned out of enclosure bed to regular hospital bed with sitter without incident. Plan to d/c sitter due to patient progress.   Patient continues to demonstrate the following deficits: decreased safety awareness, decreased attention, decreased intellectual > emergent awareness, decreased memory, decreased problem solving, decreased activity tolerance and therefore will continue to benefit from skilled PT intervention to enhance overall performance with cognitive deficits, activity tolerance, balance, ability to compensate for deficits, attention, awareness and safety.  Patient progressing toward long term goals.  Continue plan of care.   PT Short Term Goals Week 1:  PT Short Term Goal 1 (Week 1): Patient will perform functional ambulation < 150 ft in home and community environments with supervision.  PT Short Term Goal 1 - Progress (Week 1): Partly met (in home environment) PT Short Term Goal 2 (Week 1): Patient will negotiate flight of stairs with 1 rail and mod I.  PT Short Term Goal 2 - Progress (Week 1): Met PT Short Term Goal 3 (Week 1): Patient will sustain attention to therapeutic activity x 3 min with min  cues.  PT Short Term Goal 3 - Progress (Week 1): Met PT Short Term Goal 4 (Week 1): Patient will demonstrate intellectual awareness in controlled environment with mod cues.  PT Short Term Goal 4 - Progress (Week 1): Not met (Requires total A for intellectual awareness) Week 2:  PT Short Term Goal 1 (Week 2): Patient will perform functional ambulation > 150 ft in community environment with supervision.  PT Short Term Goal 2 (Week 2): Patient will demonstrate ability to safely discharge sitter in room with distant supervision from RN station.  PT Short Term Goal 3 (Week 2): Patient will sustain attention to therapeutic activity x 5 min with supervision.  PT Short Term Goal 4 (Week 2): Patient will demonstrate intellectual awareness in controlled environment with max cues.   Skilled Therapeutic Interventions/Progress Updates:   Session focused on sustained attention to therapeutic task of laundry and orientation. Patient received asleep in bed, easily aroused. Utilized schedule with mod multimodal cues for patient awareness of participation in current therapy session as he perseverated on "missing class." Patient retrieved clothing from dryer with min cues to recall location of laundry machines and initiated use of bags to carry laundry back to room. Patient unable to fold and put laundry away with max multimodal cues but eventually returned clothes to bags and placed on floor. Patient ambulated to retrieve hot chocolate in day room with supervision and noted to place pack of peanut butter in pocket. Patient returned peanut butter to therapist with min cues due to patient's current diet (D2). Patient engaged in therapeutic conversation in minimally distracting environment with focus on orientation. Patient oriented to place, age, month, and president, disoriented to situation. Patient left sitting on bed with sitter present.  Therapy Documentation Precautions:  Precautions Precautions: Fall Precaution  Comments: Elopement risk, agitation, PEG Restrictions Weight Bearing Restrictions: No Pain: Pain Assessment Pain Assessment: No/denies pain  See FIM for current functional status  Therapy/Group: Individual Therapy  Laretta Alstrom 04/29/2014, 10:49 AM

## 2014-04-29 NOTE — Progress Notes (Signed)
Speech Language Pathology Daily Session Note  Patient Details  Name: Justin Pugh MRN: [id number] Date of Birth: [birth year]  Today's Date: 04/29/2014 SLP Individual Time: 0800-0840 SLP Individual Time Calculation (min): 40 min  Short Term Goals: Week 7: SLP Short Term Goal 1 (Week 7): Patient will orient to time, place and situation with Max A multimodal cues.  SLP Short Term Goal 2 (Week 7): Patient will demonstrate sustained attention to functional task for 2 minutes with Max A multimodal cues.  SLP Short Term Goal 3 (Week 7): Patient will demonstrate efficient mastication with trials of Dys. 2  textures with minimal overt s/s of aspiration with Min A multimodal cues. SLP Short Term Goal 4 (Week 7): Patient will consume trials of thin liquids with minimal overt s/s of aspiration with supervision multimodal cues for use of swallowing strategies.   Skilled Therapeutic Interventions: Skilled treatment session focused on addressing dysphagia and cognitive goals. SLP facilitated session with Supervision question cues to problem solve tray set up. Patient consumed tray of Dys. 2 textures with thin liquids without overt s/s of aspiration and required intermittent verbal cues for use of small bites. Patient demonstrated divided attention between self-feeding and conversation with Mod A verbal cues for 30 minutes for redirection.  Patient continues to demonstrate language of confusion and requires total A for intellectual awareness of cognitive deficits. Patient required Mod A multimodal cues for utilization of schedule to recall previous therapy session and anticipate upcoming sessions as well as for orientation in regards to date. Patient independently asked for hot chocolate and ambulated to and from dayroom with supervision and required Mod A multimodal cues for problem solving with task. Patient left in room with sitter present. Continue with current plan of care.     FIM:   Comprehension Comprehension Mode: Auditory Comprehension: 3-Understands basic 50 - 74% of the time/requires cueing 25 - 50%  of the time Expression Expression Mode: Verbal Expression: 3-Expresses basic 50 - 74% of the time/requires cueing 25 - 50% of the time. Needs to repeat parts of sentences. Social Interaction Social Interaction: 3-Interacts appropriately 50 - 74% of the time - May be physically or verbally inappropriate. Problem Solving Problem Solving: 1-Solves basic less than 25% of the time - needs direction nearly all the time or does not effectively solve problems and may need a restraint for safety Memory Memory: 1-Recognizes or recalls less than 25% of the time/requires cueing greater than 75% of the time FIM - Eating Eating Activity: 5: Supervision/cues  Pain Pain Assessment Pain Assessment: No/denies pain  Therapy/Group: Individual Therapy  Kataleia Quaranta 04/29/2014, 9:09 AM

## 2014-04-30 ENCOUNTER — Inpatient Hospital Stay (HOSPITAL_COMMUNITY): Payer: Medicaid Other | Admitting: Speech Pathology

## 2014-04-30 ENCOUNTER — Inpatient Hospital Stay (HOSPITAL_COMMUNITY): Payer: Medicaid Other | Admitting: Physical Therapy

## 2014-04-30 ENCOUNTER — Inpatient Hospital Stay (HOSPITAL_COMMUNITY): Payer: Self-pay

## 2014-04-30 NOTE — Progress Notes (Signed)
Physical Therapy Session Note  Patient Details  Name: Justin Pugh MRN: [id number] Date of Birth: 22-Oct-1955  Today's Date: 04/30/2014 PT Individual Time: 0800-0845 PT Individual Time Calculation (min): 45 min   Short Term Goals: Week 2:  PT Short Term Goal 1 (Week 2): Patient will perform functional ambulation > 150 ft in community environment with supervision.  PT Short Term Goal 2 (Week 2): Patient will demonstrate ability to safely discharge sitter in room with distant supervision from RN station.  PT Short Term Goal 3 (Week 2): Patient will sustain attention to therapeutic activity x 5 min with supervision.  PT Short Term Goal 4 (Week 2): Patient will demonstrate intellectual awareness in controlled environment with max cues.   Skilled Therapeutic Interventions/Progress Updates:   Session focused on sustained attention to functional tasks, awareness,and orientation. Patient received asleep in bed, easily aroused. Patient aware of room change, d/c sitter, and distant supervision being provided by nursing station. Patient required min cues for topographical orientation to locate ADL apartment from room and upon returning to room, patient initially ambulated toward old room but was able to self-monitor and correct to locate new room with supervision. Patient sustained attention x 2 min to structrured task of cleaning up ADL apartment (making bed, folding clothes, picking items up from floor) with mod-max multimodal cues for redirection, limited by patient frustration of having to "clean up a mess I didn't make." Unable to engage patient in game of Wii basketball with max cues. Patient stated he wanted to leave gym to get coffee/cocoa but waited with min cues until therapist finished game. Patient ambulated to family room to prepare two cups of hot chocolate with min-mod cues. Patient took peanut butter packs but returned to therapist with mod cues. Engaged patient in therapeutic conversation  while drinking hot chocolate in room with focus on orientation and recall accuracy of former jobs. Patient left sitting in chair with barrier sheet in place.   Therapy Documentation Precautions:  Precautions Precautions: Fall Precaution Comments: Elopement risk, agitation, PEG Restrictions Weight Bearing Restrictions: No Pain: Pain Assessment Pain Assessment: No/denies pain Locomotion : Ambulation Ambulation/Gait Assistance: 6: Modified independent (Device/Increase time)   See FIM for current functional status  Therapy/Group: Individual Therapy  [name], Jordynne Mccown A 04/30/2014, 10:13 AM

## 2014-04-30 NOTE — Progress Notes (Addendum)
Speech Language Pathology Weekly Progress and Session Note  Patient Details  Name: Justin Pugh MRN: [id number] Date of Birth: 09-02-1955  Beginning of progress report period: April 23, 2014 End of progress report period: April 30, 2014  Today's Date: 04/30/2014 SLP Individual Time: 1305-1350 SLP Individual Time Calculation (min): 45 min  Short Term Goals: Week 7: SLP Short Term Goal 1 (Week 7): Patient will orient to time, place and situation with Max A multimodal cues.  (NOT MET) SLP Short Term Goal 2 (Week 7): Patient will demonstrate sustained attention to functional task for 2 minutes with Max A multimodal cues.  (MET) SLP Short Term Goal 3 (Week 7): Patient will demonstrate efficient mastication with trials of Dys. 2  textures with minimal overt s/s of aspiration with Min A multimodal cues. (MET) SLP Short Term Goal 4 (Week 7): Patient will consume trials of thin liquids with minimal overt s/s of aspiration with supervision multimodal cues for use of swallowing strategies. (MET)    New Short Term Goals: Week 8: SLP Short Term Goal 1 (Week 8): Patient will orient to time, place and situation with Max A multimodal cues.   SLP Short Term Goal 2 (Week 8): Patient will demonstrate sustained attention to functional task for 5 minutes with Max A multimodal cues.   SLP Short Term Goal 3 (Week 8): Patient will demonstrate efficient mastication with trials of Dys. 3  textures with minimal overt s/s of aspiration with Min A multimodal cues. SLP Short Term Goal 4 (Week 8): Patient will consume current diet with minimal overt s/s of aspiration with Mod I for use of swallowing strategies.  Weekly Progress Updates: Patient has made functional gains and has met 3 of 4 STG's this reporting period due to increased swallowing function and sustained attention. Currently, patient is consuming Dys. 2 textures with thin liquids without overt s/s of aspiration and utilizes small bites and a slow rate of  self-feeding with supervision multimodal cues.  Patient continues to demonstrate increased ability to participate in treatment session in a safe and cooperative manner and demonstrates behaviors consistent with a Rancho Level V. Overall, patient requires total A to orient to situation, intellectual awareness of cognitive deficits and Max A multimodal cues for orientation to time and place, sustained attention and recall of functional information. Patient would benefit from continued skilled SLP intervention to maximize cognitive and swallowing function in order to maximize his functional independence and reduce caregiver burden.   Intensity: Minumum of 1-2 x/day, 30 to 90 minutes Frequency: 3 to 5 out of 7 days Duration/Length of Stay: TBD due to D/C to ALF with a locked unit  Treatment/Interventions: Dysphagia/aspiration precaution training;Environmental controls;Functional tasks;Patient/family education;Therapeutic Activities;Cueing hierarchy;Cognitive remediation/compensation;Internal/external aids   Daily Session  Skilled Therapeutic Interventions: Skilled treatment session focused on cognitive goals. Upon arrival, patient was sitting upright in the chair watching television and agreeable to participate in treatment session.  SLP facilitated session by facilitating a list making task with focus on activities patient can participate in safely while Mod I in his room and patient was able to identify 4 different appropriate activities.  Patient then ambulated around unit with this clinician gathering items for activities (pencil, notepad, books, crayons, coloring pages, etc) and making copies with Mod A verbal and question cues for problem solving with task. Patient was able to demonstrate sustained attention to tasks throughout the session for ~2 minutes with Mod A multimodal cues needed for redirection. Patient also utilized external aids to orient to  date with Mod A multimodal cues and required Max A  multimodal cues for orientation to place and total A multimodal cues for orientation to situation. Patient ambulated back to room and left with barrier sheet in place. Continue with current plan of care.   FIM:  Comprehension Comprehension Mode: Auditory Comprehension: 3-Understands basic 50 - 74% of the time/requires cueing 25 - 50%  of the time Expression Expression Mode: Verbal Expression: 3-Expresses basic 50 - 74% of the time/requires cueing 25 - 50% of the time. Needs to repeat parts of sentences. Social Interaction Social Interaction: 3-Interacts appropriately 50 - 74% of the time - May be physically or verbally inappropriate. Problem Solving Problem Solving: 1-Solves basic less than 25% of the time - needs direction nearly all the time or does not effectively solve problems and may need a restraint for safety Memory Memory: 1-Recognizes or recalls less than 25% of the time/requires cueing greater than 75% of the time Pain Pain Assessment Pain Assessment: No/denies pain   Therapy/Group: Individual Therapy  Fredi Hurtado 04/30/2014, 2:52 PM

## 2014-04-30 NOTE — Progress Notes (Signed)
[facility] PHYSICAL MEDICINE & REHABILITATION     PROGRESS NOTE    Subjective/Complaints: Had a good night. No sitters.  ROS--still somewhat limited due to cognition, denies pain, sob, cough  Objective: Vital Signs: Blood pressure 98/52, pulse 54, temperature 98.1 F (36.7 C), temperature source Oral, resp. rate 20, height 6' (1.829 m), weight 84.5 kg (186 lb 4.6 oz), SpO2 100 %. No results found.  Recent Labs  04/28/14 0623  WBC 5.4  HGB 11.7*  HCT 36.4*  PLT 273    Recent Labs  04/28/14 0623  NA 140  K 4.0  CL 104  GLUCOSE 94  BUN 6  CREATININE 0.68  CALCIUM 9.3   CBG (last 3)  No results for input(s): GLUCAP in the last 72 hours.  Wt Readings from Last 3 Encounters:  04/30/14 84.5 kg (186 lb 4.6 oz)  03/09/14 84.188 kg (185 lb 9.6 oz)  01/23/14 95.255 kg (210 lb)    Physical Exam:  Constitutional: He appears well-developed and well-nourished. He is sleeping. He is easily aroused.  HENT:  Head: Normocephalic.  Neck: supple Cardiovascular: Normal rate and regular rhythm.  Respiratory: Effort normal. No respiratory distress.  GI: Soft. Bowel sounds are normal. He exhibits no distension. There is mild abdominal tenderness, peg site a little red, no drainage or tenderness Musculoskeletal: He exhibits no edema.  Neurological: He is alert today.        Moves all extremities without difficulty. equally moves all 4's. Senses pain in all 4. Oriented to name only. confabulates Skin: Skin is warm and dry.  Psychiatric:  Confused but more appropriate. Showing improved insight and awareness  Assessment/Plan: 1. 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 Physiatrist is providing close team supervision and 24 hour management of active medical problems listed below. Physiatrist and rehab team continue to assess barriers to discharge/monitor patient progress  toward functional and medical goals. Appreciate trauma note, contrast seen in stomach and duodenum although RN states there is some leakage around stoma FIM: FIM - Bathing Bathing Steps Patient Completed: Chest, Abdomen, Front perineal area, Right Arm, Left Arm, Buttocks, Right upper leg, Left upper leg, Left lower leg (including foot), Right lower leg (including foot) Bathing: 6: More than reasonable amount of time  FIM - Upper Body Dressing/Undressing Upper body dressing/undressing steps patient completed: Thread/unthread right sleeve of pullover shirt/dresss, Thread/unthread left sleeve of pullover shirt/dress, Put head through opening of pull over shirt/dress, Pull shirt over trunk Upper body dressing/undressing: 5: Set-up assist to: Obtain clothing/put away FIM - Lower Body Dressing/Undressing Lower body dressing/undressing steps patient completed: Thread/unthread right underwear leg, Thread/unthread left underwear leg, Pull underwear up/down, Thread/unthread right pants leg, Thread/unthread left pants leg, Pull pants up/down, Don/Doff right sock, Don/Doff left sock, Don/Doff right shoe, Fasten/unfasten left shoe, Fasten/unfasten right shoe, Don/Doff left shoe Lower body dressing/undressing: 5: Set-up assist to: Obtain clothing  FIM - Toileting Toileting steps completed by patient: Adjust clothing prior to toileting, Performs perineal hygiene, Adjust clothing after toileting Toileting Assistive Devices: Grab bar or rail for support Toileting: 6: Assistive device: No helper  FIM - Diplomatic Services operational officerToilet Transfers Toilet Transfers Assistive Devices: Therapist, musicGrab bars Toilet Transfers: 7-Independent: No helper  FIM - BankerBed/Chair Transfer Bed/Chair Transfer Assistive Devices: Arm rests Bed/Chair Transfer: 6: Supine > Sit: No assist, 6: Bed > Chair or W/C: No assist, 6: Chair or W/C > Bed: No assist  FIM - Locomotion: Wheelchair Locomotion: Wheelchair: 0:  Activity did not occur FIM - Locomotion:  Ambulation Locomotion: Ambulation Assistive Devices: Other (comment) (none) Ambulation/Gait Assistance: 6: Modified independent (Device/Increase time) Locomotion: Ambulation: 5: Household Independent - travels 50 - 149 ft independent or modified independent  Comprehension Comprehension Mode: Auditory Comprehension: 3-Understands basic 50 - 74% of the time/requires cueing 25 - 50%  of the time  Expression Expression Mode: Verbal Expression: 3-Expresses basic 50 - 74% of the time/requires cueing 25 - 50% of the time. Needs to repeat parts of sentences.  Social Interaction Social Interaction Mode: Not assessed Social Interaction: 3-Interacts appropriately 50 - 74% of the time - May be physically or verbally inappropriate.  Problem Solving Problem Solving: 1-Solves basic less than 25% of the time - needs direction nearly all the time or does not effectively solve problems and may need a restraint for safety  Memory Memory: 1-Recognizes or recalls less than 25% of the time/requires cueing greater than 75% of the time  Medical Problem List and Plan: 1. Functional deficits secondary to TBI   2. DVT Prophylaxis/Anticoagulation: Pharmaceutical: Lovenox 3. Pain Management: Will continue oxycodone prn. Will likely need premedication as unable to express needs. Will monitor for now.  4. Mood: Unable to gauge at this time due to cognitive deficits. LCSW to follow along for evaluation and support once more appropriate.  5. Neuropsych: This patient is not capable of making decisions on her own behalf. -out of [place] bed, no sitter 6. Skin/Wound Care: pt is mobile 7. Fluids/Electrolytes/Nutrition:megace stoppped .    8. Agitation: confused but less agitated.   -seroquel  -klonopin and librium stopped due to lethargy  -depakote 1000 mg bid -sleeping well  -appreciate psych assessment and input--- 9. ?GERD/odynophagia/dysphagia--D2 diet per SLP-  intake improved--no aspiration.   -off tube feeds  -tube placed 2/15--can remove potentially next week 10. RML pneumonia-resolved   11. Bowel and bladder: emptying   LOS (Days) 52 A FACE TO FACE EVALUATION WAS PERFORMED  Laterra Lubinski T 04/30/2014 8:13 AM

## 2014-04-30 NOTE — Progress Notes (Signed)
Occupational Therapy Session Note  Patient Details  Name: Justin CohoJeffrey L Cocking MRN: [id number] Date of Birth: 1955-10-06  Today's Date: 04/30/2014 OT Individual Time: 0700-0745 OT Individual Time Calculation (min): 45 min    Short Term Goals: Week 5:  OT Short Term Goal 1 (Week 5): Pt will demonstrate sustained attention to structured task (not ADL) for 10 min with mod cues  OT Short Term Goal 2 (Week 5): Pt will recall 3 activities completed in previous therapy session with mod cues OT Short Term Goal 3 (Week 5): Pt will demonstrate improve safety and remain in room with external cues and no sitter between therapy sessions 3/7 days   Skilled Therapeutic Interventions/Progress Updates:    Pt seen for ADL retraining with focus on sequencing, sustained attention, dynamic standing balance, organization, and orientation. Pt received supine in bed. Utilized schedule with mod cues for awareness and identifying current therapy. Pt orientated to person, place, and time with no cues and use of external aid. Pt required total A for orientation and intellectual awareness secondary to language of confusion and confabulation. Pt aware of room change closer to nurses station and stated "they need to watch me from the office now." Therapist provided further explanation of room change, removal of sitter, and use of call light for needs. Pt agreeable to shower this AM, however declined ambulating around room to retrieve items. Pt verbalized 75% of items required for bathing and dressing with min cues. Pt completed bathing and dressing at supervision level with mod cues for sustained attention secondary to internal distractions. Pt assisted therapist with bed making, requiring min cues for orientation of sheets. Pt ate breakfast with focus on attention and intellectual awareness. Pt with no over signs of aspiration during breakfast. At end of session pt returned to bed and left with all needs in reach.   Therapy  Documentation Precautions:  Precautions Precautions: Fall Precaution Comments: Elopement risk, agitation, PEG Restrictions Weight Bearing Restrictions: No General:   Vital Signs: Therapy Vitals Temp: 98.1 F (36.7 C) Temp Source: Oral Pulse Rate: (!) 54 Resp: 20 BP: (!) 98/52 mmHg Oxygen Therapy SpO2: 100 % O2 Device: Not Delivered Pain: No report of pain  See FIM for current functional status  Therapy/Group: Individual Therapy  Daneil Danerkinson, Urijah Arko N 04/30/2014, 8:52 AM

## 2014-05-01 ENCOUNTER — Inpatient Hospital Stay (HOSPITAL_COMMUNITY): Payer: Medicaid Other

## 2014-05-01 ENCOUNTER — Inpatient Hospital Stay (HOSPITAL_COMMUNITY): Payer: Medicaid Other | Admitting: Speech Pathology

## 2014-05-01 ENCOUNTER — Inpatient Hospital Stay (HOSPITAL_COMMUNITY): Payer: Medicaid Other | Admitting: Physical Therapy

## 2014-05-01 NOTE — Progress Notes (Signed)
[facility] PHYSICAL MEDICINE & REHABILITATION     PROGRESS NOTE    Subjective/Complaints: Slept soundly. Improved behavior ROS--still somewhat limited due to cognition   Objective: Vital Signs: Blood pressure 91/58, pulse 66, temperature 97.5 F (36.4 C), temperature source Oral, resp. rate 18, height 6' (1.829 m), weight 85.5 kg (188 lb 7.9 oz), SpO2 98 %. No results found. No results for input(s): WBC, HGB, HCT, PLT in the last 72 hours. No results for input(s): NA, K, CL, GLUCOSE, BUN, CREATININE, CALCIUM in the last 72 hours.  Invalid input(s): CO CBG (last 3)  No results for input(s): GLUCAP in the last 72 hours.  Wt Readings from Last 3 Encounters:  05/01/14 85.5 kg (188 lb 7.9 oz)  03/09/14 84.188 kg (185 lb 9.6 oz)  01/23/14 95.255 kg (210 lb)    Physical Exam:  Constitutional: He appears well-developed and well-nourished. He is sleeping. He is easily aroused.  HENT:  Head: Normocephalic.  Neck: supple Cardiovascular: Normal rate and regular rhythm.  Respiratory: Effort normal. No respiratory distress.  GI: Soft. Bowel sounds are normal. He exhibits no distension. There is mild abdominal tenderness, peg site a little red, no drainage or tenderness Musculoskeletal: He exhibits no edema.  Neurological: He is alert today.        Moves all extremities without difficulty. equally moves all 4's. Senses pain in all 4. Oriented to name only. confabulates Skin: Skin is warm and dry.  Psychiatric:  Confused but more appropriate. Showing improved insight and awareness  Assessment/Plan: 1. 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 Physiatrist is providing close team supervision and 24 hour management of active medical problems listed below. Physiatrist and rehab team continue to assess barriers to discharge/monitor patient progress toward functional and medical  goals. Appreciate trauma note, contrast seen in stomach and duodenum although RN states there is some leakage around stoma FIM: FIM - Bathing Bathing Steps Patient Completed: Chest, Abdomen, Front perineal area, Right Arm, Left Arm, Buttocks, Right upper leg, Left upper leg, Left lower leg (including foot), Right lower leg (including foot) Bathing: 6: More than reasonable amount of time  FIM - Upper Body Dressing/Undressing Upper body dressing/undressing steps patient completed: Thread/unthread right sleeve of pullover shirt/dresss, Thread/unthread left sleeve of pullover shirt/dress, Put head through opening of pull over shirt/dress, Pull shirt over trunk Upper body dressing/undressing: 5: Supervision: Safety issues/verbal cues FIM - Lower Body Dressing/Undressing Lower body dressing/undressing steps patient completed: Thread/unthread right underwear leg, Thread/unthread left underwear leg, Pull underwear up/down, Thread/unthread right pants leg, Thread/unthread left pants leg, Pull pants up/down, Don/Doff right sock, Don/Doff left sock Lower body dressing/undressing: 5: Supervision: Safety issues/verbal cues  FIM - Toileting Toileting steps completed by patient: Adjust clothing prior to toileting, Performs perineal hygiene, Adjust clothing after toileting Toileting Assistive Devices: Grab bar or rail for support Toileting: 6: Assistive device: No helper  FIM - Diplomatic Services operational officerToilet Transfers Toilet Transfers Assistive Devices: Therapist, musicGrab bars Toilet Transfers: 6-Assistive device: No helper  FIM - BankerBed/Chair Transfer Bed/Chair Transfer Assistive Devices: Arm rests Bed/Chair Transfer: 6: Supine > Sit: No assist, 6: Bed > Chair or W/C: No assist, 6: Chair or W/C > Bed: No assist  FIM - Locomotion: Wheelchair Locomotion: Wheelchair: 0: Activity did not occur FIM - Locomotion: Ambulation Locomotion: Ambulation Assistive Devices: Other (comment) (none) Ambulation/Gait Assistance: 6: Modified independent  (Device/Increase time) Locomotion: Ambulation: 6: Travels 150 ft or more independently/takes more than reasonable amount  of time  Comprehension Comprehension Mode: Auditory Comprehension: 3-Understands basic 50 - 74% of the time/requires cueing 25 - 50%  of the time  Expression Expression Mode: Verbal Expression: 3-Expresses basic 50 - 74% of the time/requires cueing 25 - 50% of the time. Needs to repeat parts of sentences.  Social Interaction Social Interaction Mode: Not assessed Social Interaction: 3-Interacts appropriately 50 - 74% of the time - May be physically or verbally inappropriate.  Problem Solving Problem Solving: 1-Solves basic less than 25% of the time - needs direction nearly all the time or does not effectively solve problems and may need a restraint for safety  Memory Memory: 1-Recognizes or recalls less than 25% of the time/requires cueing greater than 75% of the time  Medical Problem List and Plan: 1. Functional deficits secondary to TBI   2. DVT Prophylaxis/Anticoagulation: Pharmaceutical: Lovenox 3. Pain Management: Will continue oxycodone prn. Will likely need premedication as unable to express needs. Will monitor for now.  4. Mood: Unable to gauge at this time due to cognitive deficits. LCSW to follow along for evaluation and support once more appropriate.  5. Neuropsych: This patient is not capable of making decisions on her own behalf. -out of [place] bed, no sitter 6. Skin/Wound Care: pt is mobile 7. Fluids/Electrolytes/Nutrition:megace stoppped .    8. Agitation: confused but less agitated.   -seroquel  -klonopin and librium stopped due to lethargy  -depakote 1000 mg bid -sleeping well  -appreciate psych assessment and input--- 9. ?GERD/odynophagia/dysphagia--D2 diet per SLP- intake improved--no aspiration.   -off tube feeds  -tube placed 2/15--can remove potentially next week 10. RML  pneumonia-resolved   11. Bowel and bladder: emptying   LOS (Days) 53 A FACE TO FACE EVALUATION WAS PERFORMED  Darely Becknell T 05/01/2014 9:23 AM

## 2014-05-01 NOTE — Progress Notes (Signed)
Speech Language Pathology Daily Session Note  Patient Details  Name: Justin CohoJeffrey L Holroyd MRN: [id number] Date of Birth: 25-Jan-1956  Today's Date: 05/01/2014 SLP Individual Time: 0900-0920 SLP Individual Time Calculation (min): 20 min  Short Term Goals: Week 8: SLP Short Term Goal 1 (Week 8): Patient will orient to time, place and situation with Max A multimodal cues.  SLP Short Term Goal 2 (Week 8): Patient will demonstrate sustained attention to functional task for 5 minutes with Max A multimodal cues.  SLP Short Term Goal 3 (Week 8): Patient will demonstrate efficient mastication with trials of Dys. 3 textures with minimal overt s/s of aspiration with Min A multimodal cues. SLP Short Term Goal 4 (Week 8): Patient will consume current diet with minimal overt s/s of aspiration with Mod I for use of swallowing strategies.   Skilled Therapeutic Interventions: Skilled treatment session focused on cognitive goals. Upon arrival, patient was awake while sitting upright in chair but appeared fatigued. Patient reported he was "stressed" and did not feel well due to being up all night "with his thoughts." Patient with persistent language of confusion with increased confabulation today resulting in decreased participation and attention to tasks. Patient oriented to person, place, and date with external aid and min cues for day of the week and required total A for intellectual awareness of cognitive deficits and orientation to situation. This clinician attempted to engage patient in multiple tasks/activities, however, patient politely declined due to abovementioned reasons. Patient left sitting EOB with sheet barrier in place. Continue with current plan of care.    FIM:  Comprehension Comprehension Mode: Auditory Comprehension: 3-Understands basic 50 - 74% of the time/requires cueing 25 - 50%  of the time Expression Expression Mode: Verbal Expression: 3-Expresses basic 50 - 74% of the time/requires cueing  25 - 50% of the time. Needs to repeat parts of sentences. Social Interaction Social Interaction: 3-Interacts appropriately 50 - 74% of the time - May be physically or verbally inappropriate. Problem Solving Problem Solving: 1-Solves basic less than 25% of the time - needs direction nearly all the time or does not effectively solve problems and may need a restraint for safety Memory Memory: 1-Recognizes or recalls less than 25% of the time/requires cueing greater than 75% of the time  Pain Pain Assessment Pain Assessment: No/denies pain  Therapy/Group: Individual Therapy  Tavarus Poteete 05/01/2014, 10:00 AM

## 2014-05-01 NOTE — Progress Notes (Signed)
Physical Therapy Session Note  Patient Details  Name: Justin Pugh MRN: [id number] Date of Birth: 05-06-1955  Today's Date: 05/01/2014 PT Individual Time: 1430-1515 PT Individual Time Calculation (min): 45 min   Short Term Goals: Week 2:  PT Short Term Goal 1 (Week 2): Patient will perform functional ambulation > 150 ft in community environment with supervision.  PT Short Term Goal 2 (Week 2): Patient will demonstrate ability to safely discharge sitter in room with distant supervision from RN station.  PT Short Term Goal 3 (Week 2): Patient will sustain attention to therapeutic activity x 5 min with supervision.  PT Short Term Goal 4 (Week 2): Patient will demonstrate intellectual awareness in controlled environment with max cues.   Skilled Therapeutic Interventions/Progress Updates:   Patient received sitting in armchair with RN departing. Session focused on sustained attention, sequencing, organization, problem solving, anticipation of future needs, social behavior, topographical orientation and short term recall. Patient engaged in structured task of moving therapist's belongings from office to office, packing items, locating given administrative staff member Justin Samples(Athena) by appropriately asking for directions at nurses' station, following verbal directions to office, anticipating needs of office supplies logistics and quantity (checking if file organizer will fit due to shelf height and needing 2 organizers based on therapist's folders), awareness of admin staff's work schedule posted on door and asking for permission from therapist to knock based on schedule, recalling locations of both offices and admin staff office to carry items back and forth, and assisting with cleaning dusty workspace with overall min questioning cues for problem solving and recall. Patient sustained attention x 30 min with min cues for redirection to task. Patient pleasant and polite throughout entire session. Patient  performed car transfer including opening/closing door and buckling seat belt with mod I. Patient ambulated to family room and prepared hot chocolate before returning to room. Patient left in bathroom at end of session, mod I in controlled environment.   Therapy Documentation Precautions:  Precautions Precautions: Fall Precaution Comments: Elopement risk, agitation, PEG Restrictions Weight Bearing Restrictions: No Pain: Pain Assessment Pain Assessment: No/denies pain Locomotion : Ambulation Ambulation/Gait Assistance: 6: Modified independent (Device/Increase time)   See FIM for current functional status  Therapy/Group: Individual Therapy  [name], Shweta Aman A 05/01/2014, 4:11 PM

## 2014-05-01 NOTE — Progress Notes (Signed)
Occupational Therapy Session Note  Patient Details  Name: Justin Pugh MRN: [id number] Date of Birth: Mar 06, 1955  Today's Date: 05/01/2014 OT Individual Time: [id number] OT Individual Time Calculation (min): 45 min    Short Term Goals: Week 5:  OT Short Term Goal 1 (Week 5): Pt will demonstrate sustained attention to structured task (not ADL) for 10 min with mod cues  OT Short Term Goal 2 (Week 5): Pt will recall 3 activities completed in previous therapy session with mod cues OT Short Term Goal 3 (Week 5): Pt will demonstrate improve safety and remain in room with external cues and no sitter between therapy sessions 3/7 days   Skilled Therapeutic Interventions/Progress Updates:    Pt seen for 1:1 OT session with focus on attention, sequencing, path finding, orientation, awareness, and activity tolerance. Pt received supine in bed agreeable to therapy. Pt oriented to person, place, and date with external aid and min cues for day of the week. Pt required total A for intellectual awareness and orientation to situation due to language of confusion and confabulation. Pt ambulated to family room to prepare hot chocolate then returned to room without cues for path finding. Engaged in number finding task with physical activity paired with number, focusing on sustained, selective and alternating attention as pt completed activity with tv on as distraction. Pt located numbers in order and completed physical activity on card (jumping jacks, etc) with mod-max cues. Pt fatigued halfway through activity, requiring long rest break before locating cards with numbers only. Did not complete physical activities on last 3 cards due to fatigue. At end of session pt left sitting EOB with all needs in reach.   Therapy Documentation Precautions:  Precautions Precautions: Fall Precaution Comments: Elopement risk, agitation, PEG Restrictions Weight Bearing Restrictions: No General:   Vital Signs: Therapy  Vitals Temp: 97.5 F (36.4 C) Temp Source: Oral Pulse Rate: 66 Resp: 18 BP: (!) 91/58 mmHg Patient Position (if appropriate): Sitting Oxygen Therapy SpO2: 98 % O2 Device: Not Delivered Pain: No report of pain  See FIM for current functional status  Therapy/Group: Individual Therapy  Daneil Danerkinson, Justin Pugh 05/01/2014, 7:50 AM

## 2014-05-02 ENCOUNTER — Inpatient Hospital Stay (HOSPITAL_COMMUNITY): Payer: Medicaid Other | Admitting: Occupational Therapy

## 2014-05-02 ENCOUNTER — Inpatient Hospital Stay (HOSPITAL_COMMUNITY): Payer: Medicaid Other | Admitting: Physical Therapy

## 2014-05-02 DIAGNOSIS — K219 Gastro-esophageal reflux disease without esophagitis: Secondary | ICD-10-CM

## 2014-05-02 NOTE — Progress Notes (Signed)
Patient ID: Justin Pugh, male   DOB: [birth year], 57 y.o.   MRN: [id number]     PHYSICAL MEDICINE & REHABILITATION     PROGRESS NOTE   05/02/14.  Subjective/Complaints: Slept soundly. Improved behavior.  No complaints, but confused.  Up and ambulatory in the room ROS--still somewhat limited due to cognition    Intake/Output Summary (Last 24 hours) at 05/02/14 0910 Last data filed at 05/02/14 0800  Gross per 24 hour  Intake   1080 ml  Output      0 ml  Net   1080 ml    Patient Vitals for the past 24 hrs:  BP Temp Temp src Pulse Resp SpO2 Weight  05/02/14 0614 102/64 mmHg 97.9 F (36.6 C) Oral (!) 55 17 95 % 154 lb 1.6 oz (69.9 kg)  05/01/14 2206 115/66 mmHg - - 67 - - -  05/01/14 1500 103/68 mmHg 97.6 F (36.4 C) Oral 72 18 100 % -      Objective: Vital Signs: Blood pressure 102/64, pulse 55, temperature 97.9 F (36.6 C), temperature source Oral, resp. rate 17, height 6' (1.829 m), weight 154 lb 1.6 oz (69.9 kg), SpO2 95 %. No results found. No results for input(s): WBC, HGB, HCT, PLT in the last 72 hours. No results for input(s): NA, K, CL, GLUCOSE, BUN, CREATININE, CALCIUM in the last 72 hours.  Invalid input(s): CO CBG (last 3)  No results for input(s): GLUCAP in the last 72 hours.  Wt Readings from Last 3 Encounters:  05/02/14 154 lb 1.6 oz (69.9 kg)  03/09/14 185 lb 9.6 oz (84.188 kg)  01/23/14 210 lb (95.255 kg)    Physical Exam:  Constitutional: He appears well-developed and well-nourished. He is sleeping. He is easily aroused.  HENT:  Head: Normocephalic.  Neck: supple Cardiovascular: Normal rate and regular rhythm.  Respiratory: Effort normal. No respiratory distress.  GI: Soft. Bowel sounds are normal. He exhibits no distension. There is mild abdominal tenderness, peg site a little red, no drainage or tenderness Musculoskeletal: He exhibits no edema.  Neurological: He is alert today.     ambulates in the room without  difficultyconfabulates Skin: Skin is warm and dry.  Psychiatric:  Confused but more appropriate. Showing improved insight and awareness  Assessment/Plan: 1. Functional deficits secondary to TBI   2. DVT Prophylaxis/Anticoagulation: Pharmaceutical: Lovenox 3. Pain Management: Will continue oxycodone prn. Will likely need premedication as unable to express needs. Will monitor for now.  4. Mood: Unable to gauge at this time due to cognitive deficits. LCSW to follow along for evaluation and support once more appropriate.  5. Neuropsych: This patient is not capable of making decisions on her own behalf. -out of [place]ail bed, no sitter 6. Skin/Wound Care: pt is mobile 7. Fluids/Electrolytes/Nutrition:megace stoppped .    8. Agitation: confused but less agitated.   -seroquel  -klonopin and librium stopped due to lethargy  -depakote 1000 mg bid -sleeping well  -appreciate psych assessment and input--- 9. ?GERD/odynophagia/dysphagia--D2 diet per SLP- intake improved--no aspiration.   -off tube feeds  -tube placed 2/15-   LOS (Days) 54 A FACE TO FACE EVALUATION WAS PERFORMED  Rogelia BogaKWIATKOWSKI,PETER FRANK 05/02/2014 9:09 AM

## 2014-05-02 NOTE — Progress Notes (Signed)
Physical Therapy Session Note  Patient Details  Name: Justin Pugh MRN: [id number] Date of Birth: 1955/05/24  Today's Date: 05/02/2014 PT Individual Time: 1400-1430 PT Individual Time Calculation (min): 30 min   Short Term Goals: Week 2:  PT Short Term Goal 1 (Week 2): Patient will perform functional ambulation > 150 ft in community environment with supervision.  PT Short Term Goal 2 (Week 2): Patient will demonstrate ability to safely discharge sitter in room with distant supervision from RN station.  PT Short Term Goal 3 (Week 2): Patient will sustain attention to therapeutic activity x 5 min with supervision.  PT Short Term Goal 4 (Week 2): Patient will demonstrate intellectual awareness in controlled environment with max cues.   Skilled Therapeutic Interventions/Progress Updates:    Neuromuscular Reeducation: Session focused on cognitive remediation activities including: problem solving, dual tasking, short term memory, and sequencing (following one and two step commands). PT asked pt for assistance in [facility] into other areas around the unit in order to empty out the BI gym and make room for new equipment to be placed in that room. Pt is very agreeable to helping and polite throughout to this therapist throughout session. Pt demonstrates ability to maintain balance while rolling multiple dynamaps (2) at a time from [facility] to day room while telling a story that evidenced pt believed he is in jail. With PT instructions, pt is able to search for all of the games in the [facility] and then find an appropriate place in the main gym for them to be stored (near the other games), including rearranging other items, showing good spatial awareness. Pt's short term memory for BI gym location is initially poor, but with repeated trips back and forth from main gym, pt ultimately walks down the correct hallways and uses context clues to find the BI gym after multiple trips. After pt places the  hoola hoop in one spot in the main gym, and PT notes hoola hoops are stored in another location (on a different trip to the gym), pt demonstrates ability to remember where he stored the hoola hoop, obtain it, remove hot pad covers that are in the way, hang the hoola hoop with the others, and then hangs the hot pad cover not by draping it over the hook as it was (and as the others are positioned), but by tying the end of it to the hook.   Pt is progressing in participation, problem solving, and social appropriateness, but continues to have intellectual awareness deficits: at times believing he is in jail and later in the session believing he is at an inn. Continue per PT POC.   Therapy Documentation Precautions:  Precautions Precautions: Fall Precaution Comments: Elopement risk, agitation, PEG Restrictions Weight Bearing Restrictions: No Pain: Pain Assessment Pain Assessment: No/denies pain  See FIM for current functional status  Therapy/Group: Individual Therapy  Farhan Jean M 05/02/2014, 3:37 PM

## 2014-05-02 NOTE — Progress Notes (Signed)
Occupational Therapy Session Note  Patient Details  Name: Justin Pugh MRN: [id number] Date of Birth: 1955-12-14  Today's Date: 05/02/2014 OT Individual Time: 1430-1500 OT Individual Time Calculation (min): 30 min    Short Term Goals: Week 5:  OT Short Term Goal 1 (Week 5): Pt will demonstrate sustained attention to structured task (not ADL) for 10 min with mod cues  OT Short Term Goal 2 (Week 5): Pt will recall 3 activities completed in previous therapy session with mod cues OT Short Term Goal 3 (Week 5): Pt will demonstrate improve safety and remain in room with external cues and no sitter between therapy sessions 3/7 days   Skilled Therapeutic Interventions/Progress Updates:  Pt transitioning easily from PT session and with no c/o pain this session. Pt oriented to person and place this session with use of wall calendar to identify date. Pt also utilizing external aid to identify [facility] day and first day of spring. Pt requiring total A to orient to situation based on confabulation with speech and later thinking that he is in a jail and things are being taken from him. Session with focus on attention, path finding, activity tolerance, and sequencing. Pt assisting therapist with relocating items from one gym to another. Pt problem solving how to safety move a certain items to gym with min verbal cues. Once items relocated pt deciding where to place items with min verbal guidance cues. Pt required supervision for ambulation when carrying multiple items in arms as well as pushing cart. Pt also went into ADL apartment and folded large comforter with therapist assisting at other side of fabric. Pt demonstrates good path finding by being able to navigate hallways from gym to gym and back to work without any issue. Pt returns to room with mother arriving. Sheet barrier placed on doorway entrance upon exiting the room.    Therapy Documentation Precautions:  Precautions Precautions:  Fall Precaution Comments: Elopement risk, agitation, PEG Restrictions Weight Bearing Restrictions: No Pain: Pain Assessment Pain Assessment: No/denies pain  See FIM for current functional status  Therapy/Group: Individual Therapy  Lowella Gripittman, Vaughan Garfinkle L 05/02/2014, 4:08 PM

## 2014-05-03 ENCOUNTER — Inpatient Hospital Stay (HOSPITAL_COMMUNITY): Payer: Self-pay | Admitting: Occupational Therapy

## 2014-05-03 ENCOUNTER — Inpatient Hospital Stay (HOSPITAL_COMMUNITY): Payer: Medicaid Other | Admitting: Physical Therapy

## 2014-05-03 MED ORDER — PROPRANOLOL HCL 10 MG PO TABS
10.0000 mg | ORAL_TABLET | Freq: Three times a day (TID) | ORAL | Status: DC
  Administered 2014-05-03 – 2014-05-15 (×34): 10 mg
  Filled 2014-05-03 (×42): qty 1

## 2014-05-03 MED ORDER — CLONIDINE HCL 0.1 MG/24HR TD PTWK
0.1000 mg | MEDICATED_PATCH | TRANSDERMAL | Status: DC
  Administered 2014-05-03 – 2014-05-17 (×3): 0.1 mg via TRANSDERMAL
  Filled 2014-05-03 (×3): qty 1

## 2014-05-03 NOTE — Progress Notes (Signed)
Physical Therapy Session Note  Patient Details  Name: Justin Pugh MRN: [id number] Date of Birth: February 03, 1956  Today's Date: 05/03/2014 PT Individual Time: 1400-1430 PT Individual Time Calculation (min): 30 min   Short Term Goals: Week 1:  PT Short Term Goal 1 (Week 1): Patient will perform functional ambulation < 150 ft in home and community environments with supervision.  PT Short Term Goal 1 - Progress (Week 1): Partly met (in home environment) PT Short Term Goal 2 (Week 1): Patient will negotiate flight of stairs with 1 rail and mod I.  PT Short Term Goal 2 - Progress (Week 1): Met PT Short Term Goal 3 (Week 1): Patient will sustain attention to therapeutic activity x 3 min with min cues.  PT Short Term Goal 3 - Progress (Week 1): Met PT Short Term Goal 4 (Week 1): Patient will demonstrate intellectual awareness in controlled environment with mod cues.  PT Short Term Goal 4 - Progress (Week 1): Not met (Requires total A for intellectual awareness)  Skilled Therapeutic Interventions/Progress Updates:  Pt was seen bedside in the pm. Pt very polite and willing to participate with therapy. Treatment focused on problem solving, direction following, and orientation. Pt able to locate rooms throughout floor with increased time. Pt able to ambulate throughout unit, mod I requiring increased time. Following treatment, pt returned to room and left with sheet and stop sign in place.   Therapy Documentation Precautions:  Precautions Precautions: Fall Precaution Comments: Elopement risk, agitation, PEG Restrictions Weight Bearing Restrictions: No General:   Pain: No c/o pain.    Locomotion : Ambulation Ambulation/Gait Assistance: 6: Modified independent (Device/Increase time)   See FIM for current functional status  Therapy/Group: Individual Therapy  Dub Amis 05/03/2014, 2:52 PM

## 2014-05-03 NOTE — Progress Notes (Signed)
Occupational Therapy Session Note  Patient Details  Name: Justin Pugh MRN: [id number] Date of Birth: January 29, 1956  Today's Date: 05/03/2014 OT Individual Time: [id number] OT Individual Time Calculation (min): 30 min    Skilled Therapeutic Interventions/Progress Updates:    Pt pleasant in conversation to begin session.  Not oriented to place, time, or situation however he was able to use external aide to determine date with min questioning cueing.  [facility]  He continually during session demonstrated some verbal confusion and confabulation when discussing someone stealing his "stuff" and talking on and on about random ideas/topics with no clear rationale.  He ambulated down to the therapy gym with therapist and worked on cleaning therapy mats.  He pleasantly agreed to help including applying pillow cases to 2 pillows.  Pt reporting that he likes to help and will usually get coffee or hot chocolate for his work.  He was able to then ambulate to the pt/family room to fix a cup of hot chocolate without verbal cueing for direction.  When walking past the wanderguard he was able to tell the therapist the numbers to push to stop it from alarming.  Returned to room at end of session.  Pt still with decreased awareness of time, place, or situation, and does not remember when he is told.    Therapy Documentation Precautions:  Precautions Precautions: Fall Precaution Comments: Elopement risk, agitation, PEG Restrictions Weight Bearing Restrictions: No  Pain: Pain Assessment Pain Assessment: No/denies pain ADL: See FIM for current functional status  Therapy/Group: Individual Therapy  Freda Jaquith OTR/L 05/03/2014, 3:36 PM

## 2014-05-03 NOTE — Progress Notes (Signed)
Patient's blood pressure is in the 80's/40's, HR 50's. Patient is asymptomatic.  Notified MD about low BP. Orders received to decrease propanolol to 10mg  and clonidine to 0.1mg . Patient is calm sitting in the room asking RN for any help that he can contribute.

## 2014-05-03 NOTE — Progress Notes (Signed)
Patient ID: Justin Pugh, male   DOB: 1955/10/12, 59 y.o.   MRN: [id number]   Patient ID: Justin Pugh, male   DOB: 1955/10/12, 59 y.o.   MRN: [id number]    [place] PHYSICAL MEDICINE & REHABILITATION     PROGRESS NOTE   05/03/14.   Subjective/Complaints:  59 year old patient admitted for CIR with   functional deficits secondary to TBI  No complaints, but confused.  Up and ambulatory in the room ROS--still somewhat limited due to cognition    Intake/Output Summary (Last 24 hours) at 05/03/14 0827 Last data filed at 05/02/14 1700  Gross per 24 hour  Intake    960 ml  Output      0 ml  Net    960 ml    Patient Vitals for the past 24 hrs:  BP Temp Temp src Pulse Resp SpO2 Weight  05/03/14 0642 (!) 87/44 mmHg 97.9 F (36.6 C) Oral (!) 54 16 98 % 189 lb 13.1 oz (86.1 kg)  05/02/14 2125 (!) 102/45 mmHg - - (!) 57 - - -  05/02/14 1401 (!) 90/55 mmHg 97.8 F (36.6 C) Oral 70 17 100 % -      Objective: Vital Signs: Blood pressure 87/44, pulse 54, temperature 97.9 F (36.6 C), temperature source Oral, resp. rate 16, height 6' (1.829 m), weight 189 lb 13.1 oz (86.1 kg), SpO2 98 %. No results found. No results for input(s): WBC, HGB, HCT, PLT in the last 72 hours. No results for input(s): NA, K, CL, GLUCOSE, BUN, CREATININE, CALCIUM in the last 72 hours.  Invalid input(s): CO CBG (last 3)  No results for input(s): GLUCAP in the last 72 hours.  Wt Readings from Last 3 Encounters:  05/03/14 189 lb 13.1 oz (86.1 kg)  03/09/14 185 lb 9.6 oz (84.188 kg)  01/23/14 210 lb (95.255 kg)    Physical Exam:  Constitutional: He appears well-developed and well-nourished. He is sleeping. He is easily aroused.  HENT:  Head: Normocephalic.  Neck: supple Cardiovascular: Normal rate and regular rhythm.  Respiratory: Effort normal. No respiratory distress.  GI: Soft. Bowel sounds are normal. He exhibits no distension. There is mild abdominal tenderness, peg site a little red,  no drainage or tenderness Musculoskeletal: He exhibits no edema.  Neurological: He is alert today.     ambulates in the room without difficultyconfabulates Skin: Skin is warm and dry.  Psychiatric:  Confused but more appropriate. Showing improved insight and awareness  Assessment/Plan: 1. Functional deficits secondary to TBI   2. DVT Prophylaxis/Anticoagulation: Pharmaceutical: Lovenox 3. Pain Management: Will continue oxycodone prn. Will likely need premedication as unable to express needs. Will monitor for now.  4. Mood: Unable to gauge at this time due to cognitive deficits. LCSW to follow along for evaluation and support once more appropriate.  5. Neuropsych: This patient is not capable of making decisions on her own behalf. -out of vail bed, no sitter 6. Skin/Wound Care: pt is mobile 7. Fluids/Electrolytes/Nutrition:megace stoppped .    8. Agitation: confused but less agitated.   -seroquel  -klonopin and librium stopped due to lethargy  -depakote 1000 mg bid -sleeping well  -appreciate psych assessment and input--- 9. ?GERD/odynophagia/dysphagia--D2 diet per SLP- intake improved--no aspiration.   -off tube feeds  -tube placed 2/15-   LOS (Days) 55 A FACE TO FACE EVALUATION WAS PERFORMED  Justin Pugh,Justin Pugh 05/03/2014 8:27 AM

## 2014-05-04 ENCOUNTER — Inpatient Hospital Stay (HOSPITAL_COMMUNITY): Payer: Medicaid Other

## 2014-05-04 ENCOUNTER — Inpatient Hospital Stay (HOSPITAL_COMMUNITY): Payer: Medicaid Other | Admitting: *Deleted

## 2014-05-04 ENCOUNTER — Inpatient Hospital Stay (HOSPITAL_COMMUNITY): Payer: Medicaid Other | Admitting: Speech Pathology
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
## 2014-05-04 NOTE — Progress Notes (Signed)
[facility] PHYSICAL MEDICINE & REHABILITATION     PROGRESS NOTE    Subjective/Complaints: No new issues ROS--still somewhat limited due to cognition   Objective: Vital Signs: Blood pressure 95/51, pulse 56, temperature 97.6 F (36.4 C), temperature source Oral, resp. rate 17, height 6' (1.829 m), weight 81.194 kg (179 lb), SpO2 100 %. No results found. No results for input(s): WBC, HGB, HCT, PLT in the last 72 hours. No results for input(s): NA, K, CL, GLUCOSE, BUN, CREATININE, CALCIUM in the last 72 hours.  Invalid input(s): CO CBG (last 3)  No results for input(s): GLUCAP in the last 72 hours.  Wt Readings from Last 3 Encounters:  05/04/14 81.194 kg (179 lb)  03/09/14 84.188 kg (185 lb 9.6 oz)  01/23/14 95.255 kg (210 lb)    Physical Exam:  Constitutional: He appears well-developed and well-nourished. He is sleeping. He is easily aroused.  HENT:  Head: Normocephalic.  Neck: supple Cardiovascular: Normal rate and regular rhythm.  Respiratory: Effort normal. No respiratory distress.  GI: Soft. Bowel sounds are normal. He exhibits no distension. There is mild abdominal tenderness, peg site a little red, no drainage or tenderness Musculoskeletal: He exhibits no edema.  Neurological: He is alert today.        Moves all extremities without difficulty. equally moves all 4's. Senses pain in all 4. Oriented to name only. confabulates Skin: Skin is warm and dry.  Psychiatric:  Confused but more appropriate. Showing improved insight and awareness  Assessment/Plan: 1. 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 Physiatrist is providing close team supervision and 24 hour management of active medical problems listed below. Physiatrist and rehab team continue to assess barriers to discharge/monitor patient progress toward functional and medical goals. Appreciate trauma note,  contrast seen in stomach and duodenum although RN states there is some leakage around stoma FIM: FIM - Bathing Bathing Steps Patient Completed: Chest, Abdomen, Front perineal area, Right Arm, Left Arm, Buttocks, Right upper leg, Left upper leg, Left lower leg (including foot), Right lower leg (including foot) Bathing: 6: More than reasonable amount of time  FIM - Upper Body Dressing/Undressing Upper body dressing/undressing steps patient completed: Thread/unthread right sleeve of pullover shirt/dresss, Thread/unthread left sleeve of pullover shirt/dress, Put head through opening of pull over shirt/dress, Pull shirt over trunk Upper body dressing/undressing: 5: Supervision: Safety issues/verbal cues FIM - Lower Body Dressing/Undressing Lower body dressing/undressing steps patient completed: Thread/unthread right underwear leg, Thread/unthread left underwear leg, Pull underwear up/down, Thread/unthread right pants leg, Thread/unthread left pants leg, Pull pants up/down, Don/Doff right sock, Don/Doff left sock Lower body dressing/undressing: 5: Supervision: Safety issues/verbal cues  FIM - Toileting Toileting steps completed by patient: Adjust clothing prior to toileting, Performs perineal hygiene, Adjust clothing after toileting Toileting Assistive Devices: Grab bar or rail for support Toileting: 7: Independent: No helper, no device  FIM - Diplomatic Services operational officerToilet Transfers Toilet Transfers Assistive Devices: Grab bars Toilet Transfers: 6-More than reasonable amt of time, 6-From toilet/BSC  FIM - BankerBed/Chair Transfer Bed/Chair Transfer Assistive Devices: Arm rests Bed/Chair Transfer: 6: More than reasonable amt of time  FIM - Locomotion: Wheelchair Locomotion: Wheelchair: 0: Activity did not occur FIM - Locomotion: Ambulation Locomotion: Ambulation Assistive Devices: Other (comment) (none) Ambulation/Gait Assistance: 6: Modified independent (Device/Increase time) Locomotion: Ambulation: 6: Travels 150 ft or  more independently/takes more than reasonable amount of time  Comprehension Comprehension Mode: Auditory Comprehension: 4-Understands basic 75 - 89% of the  time/requires cueing 10 - 24% of the time  Expression Expression Mode: Verbal Expression: 4-Expresses basic 75 - 89% of the time/requires cueing 10 - 24% of the time. Needs helper to occlude trach/needs to repeat words.  Social Interaction Social Interaction Mode: Not assessed Social Interaction: 3-Interacts appropriately 50 - 74% of the time - May be physically or verbally inappropriate.  Problem Solving Problem Solving: 2-Solves basic 25 - 49% of the time - needs direction more than half the time to initiate, plan or complete simple activities  Memory Memory: 2-Recognizes or recalls 25 - 49% of the time/requires cueing 51 - 75% of the time  Medical Problem List and Plan: 1. Functional deficits secondary to TBI   2. DVT Prophylaxis/Anticoagulation: Pharmaceutical: Lovenox 3. Pain Management: Will continue oxycodone prn. Will likely need premedication as unable to express needs. Will monitor for now.  4. Mood: Unable to gauge at this time due to cognitive deficits. LCSW to follow along for evaluation and support once more appropriate.  5. Neuropsych: This patient is not capable of making decisions on her own behalf. -out of vail bed, no sitter 6. Skin/Wound Care: pt is mobile 7. Fluids/Electrolytes/Nutrition:megace stoppped .    8. Agitation: confused but less agitated.   -seroquel  -klonopin and librium stopped due to lethargy  -depakote 1000 mg bid -sleeping well  -appreciate psych assessment and input--- 9. ?GERD/odynophagia/dysphagia--D2 diet per SLP- intake improved--no aspiration.   -off tube feeds  -tube placed 2/15--can remove potentially this week (will need to check with surgery first)    LOS (Days) 56 A FACE TO FACE EVALUATION WAS PERFORMED  SWARTZ,ZACHARY  T 05/04/2014 8:22 AM

## 2014-05-04 NOTE — Progress Notes (Signed)
Physical Therapy Session Note  Patient Details  Name: Justin CohoJeffrey L Pugh MRN: [id number] Date of Birth: February 19, 1956  Today's Date: 05/04/2014 PT Individual Time: 0800-0840 PT Individual Time Calculation (min): 40 min   Short Term Goals: Week 2:  PT Short Term Goal 1 (Week 2): Patient will perform functional ambulation > 150 ft in community environment with supervision.  PT Short Term Goal 2 (Week 2): Patient will demonstrate ability to safely discharge sitter in room with distant supervision from RN station.  PT Short Term Goal 3 (Week 2): Patient will sustain attention to therapeutic activity x 5 min with supervision.  PT Short Term Goal 4 (Week 2): Patient will demonstrate intellectual awareness in controlled environment with max cues.   Skilled Therapeutic Interventions/Progress Updates:   Patient received resting in bed, pleasant to this therapist but reporting frustration and perseverative throughout session on being woken up by vacuuming early this morning. Session focused on sustained attention, recall, simple problem solving, and task completion in context of functional task of taking wheelchair inventory in empty patient rooms. Patient recalled moving activity from end of last week and task of rearranging dynamaps in BI gym from weekend without cues. Patient located paper to record information independently and required min cues to utilize sign on door to determine which rooms were empty. Patient with increased frustration with reading tags on wheelchairs noted by cursing but easily redirected by switching tasks to recording information for therapist. Patient received breakfast tray and carried into day room with one cue to consume tray of Dys. 2 textures with thin liquids with no overt s/s of aspiration and supervision. Patient demonstrated divided attention between self-feeding and conversation x 10 minutes.At end of meal, patient carried tray to cart with socially appropriate interaction with  dietary staff. Patient recalled that only one hallway of inventory had been taken before breakfast and resumed task on other side of hallway without cues after returning tray. Patient attended to wheelchair inventory task before and after breakfast with overall min cues and was in close proximity to both emergency exit doors for 3-5 min with no signs or discussion of elopement attempts. Patient prepared cup of hot water to take back to room with hot chocolate mix, good safety awareness of filling cup too full and need to ambulate slowly to prevent spilling hot water. Patient left in room with barrier sheet in place.   Therapy Documentation Precautions:  Precautions Precautions: Fall Precaution Comments: Elopement risk, agitation, PEG Restrictions Weight Bearing Restrictions: No Pain: Pain Assessment Pain Assessment: No/denies pain Locomotion : Ambulation Ambulation/Gait Assistance: 6: Modified independent (Device/Increase time)   See FIM for current functional status  Therapy/Group: Individual Therapy  [name], Cassandr Cederberg A 05/04/2014, 8:55 AM

## 2014-05-04 NOTE — Progress Notes (Signed)
Occupational Therapy Session Note  Patient Details  Name: Justin Pugh MRN: [id number] Date of Birth: 1955/10/25  Today's Date: 05/04/2014 OT Individual Time: 7829-56210700-0745 OT Individual Time Calculation (min): 45 min    Short Term Goals: Week 5:  OT Short Term Goal 1 (Week 5): Pt will demonstrate sustained attention to structured task (not ADL) for 10 min with mod cues  OT Short Term Goal 2 (Week 5): Pt will recall 3 activities completed in previous therapy session with mod cues OT Short Term Goal 3 (Week 5): Pt will demonstrate improve safety and remain in room with external cues and no sitter between therapy sessions 3/7 days   Skilled Therapeutic Interventions/Progress Updates:    Pt seen for 1:1 OT session with focus on sustained attention, overall cognitive remediation, functional mobility, and safety awareness. Pt received supine in bed awake with increased frustration from floor cleaning being completed in hallway. Pt oriented to place, person, and time with external aids and mod cues. Pt required total A for orientation to situation and intellectual awareness, with pt denying any deficits stating "you all keep making this stuff up." Pt ambulated to bathroom and completed toileting then required mod cues for sequencing of hand washing. Ambulated to family room to prepare hot chocolate, however no hot chocolate present. Pt required min cues for problem solving to ask at nurses station for more hot chocolate. Pt socially appropriate when asking for hot chocolate then returned to family room. Pt required mod-max cues for organizing tea bags and hot chocolate into correct containers. Pt returned to room and engaged in therapeutic conversation as pt declining to gym, apartment, etc. Pt perseverative on floor cleaning this AM and began discussing elopement. Re-educated on therapy unit and safety and pt able to be re-directed with mod cues. Pt required max cues for orientation to holiday this month  and total A for day of the week. Pt with increased frustration throughout session however not agitated. Pt left sitting in room with all needs in reach. Therapist notified nursing staff of pt's increased frustration and speaking of elopement.   Therapy Documentation Precautions:  Precautions Precautions: Fall Precaution Comments: Elopement risk, agitation, PEG Restrictions Weight Bearing Restrictions: No General:   Vital Signs: Therapy Vitals Temp: 97.6 F (36.4 C) Temp Source: Oral Pulse Rate: (!) 56 Resp: 17 BP: (!) 95/51 mmHg Patient Position (if appropriate): Lying Oxygen Therapy SpO2: 100 % O2 Device: Not Delivered Pain: No report of pain  See FIM for current functional status  Therapy/Group: Individual Therapy  Daneil Danerkinson, Ellamae Lybeck N 05/04/2014, 7:48 AM

## 2014-05-04 NOTE — Progress Notes (Signed)
Speech Language Pathology Daily Session Note  Patient Details  Name: Justin Pugh MRN: [id number] Date of Birth: 10/26/1955  Today's Date: 05/04/2014 SLP Individual Time: 1030-1100 SLP Individual Time Calculation (min): 30 min  Short Term Goals: Short Term Goals: Week 8: SLP Short Term Goal 1 (Week 8): Patient will orient to time, place and situation with Max A multimodal cues.  SLP Short Term Goal 2 (Week 8): Patient will demonstrate sustained attention to functional task for 5 minutes with Max A multimodal cues.  SLP Short Term Goal 3 (Week 8): Patient will demonstrate efficient mastication with trials of Dys. 3 textures with minimal overt s/s of aspiration with Min A multimodal cues. SLP Short Term Goal 4 (Week 8): Patient will consume current diet with minimal overt s/s of aspiration with Mod I for use of swallowing strategies.   Skilled Therapeutic Interventions: Skilled treatment session focused on cognitive and dysphagia goals. Upon arrival, patient was asleep while supine in bed but was easily awakened to voice. SLP facilitated session by providing Min A question cues for recall of events from previous therapy sessions and for utilization of external memory aid to orient to time. Patient independently reported that he was in the hospital because, "the guy this morning told me I had a scratch or something wrong with my brain and I had no choice but to believe him." Patient also intermittently recalled some events from early in his hospitalization by reporting, "I wanted to leave when I first got here and actually escaped a few times."  Patient requested a snack and consumed Dys. 1 textures with thin liquids without overt s/s of aspiration.  Patient continues to demonstrate language of confusion but also continues to demonstrate increased awareness of current situation. Patient left sitting EOB with RN in room. Continue with current plan of care.    FIM:  Comprehension Comprehension  Mode: Auditory Comprehension: 4-Understands basic 75 - 89% of the time/requires cueing 10 - 24% of the time Expression Expression Mode: Verbal Expression: 4-Expresses basic 75 - 89% of the time/requires cueing 10 - 24% of the time. Needs helper to occlude trach/needs to repeat words. Social Interaction Social Interaction: 3-Interacts appropriately 50 - 74% of the time - May be physically or verbally inappropriate. Problem Solving Problem Solving: 2-Solves basic 25 - 49% of the time - needs direction more than half the time to initiate, plan or complete simple activities Memory Memory: 2-Recognizes or recalls 25 - 49% of the time/requires cueing 51 - 75% of the time FIM - Eating Eating Activity: 5: Supervision/cues  Pain Pain Assessment Pain Assessment: No/denies pain  Therapy/Group: Individual Therapy  Ka Flammer 05/04/2014, 11:20 AM

## 2014-05-05 ENCOUNTER — Inpatient Hospital Stay (HOSPITAL_COMMUNITY): Payer: Medicaid Other

## 2014-05-05 ENCOUNTER — Inpatient Hospital Stay (HOSPITAL_COMMUNITY): Payer: Medicaid Other | Admitting: *Deleted

## 2014-05-05 ENCOUNTER — Inpatient Hospital Stay (HOSPITAL_COMMUNITY): Payer: Medicaid Other | Admitting: Speech Pathology

## 2014-05-05 NOTE — Progress Notes (Signed)
Occupational Therapy Session Note  Patient Details  Name: Justin CohoJeffrey L Derryberry MRN: [id number] Date of Birth: August 19, 1955  Today's Date: 05/05/2014 OT Individual Time: [id number] OT Individual Time Calculation (min): 45 min    Short Term Goals: Week 5:  OT Short Term Goal 1 (Week 5): Pt will demonstrate sustained attention to structured task (not ADL) for 10 min with mod cues  OT Short Term Goal 2 (Week 5): Pt will recall 3 activities completed in previous therapy session with mod cues OT Short Term Goal 3 (Week 5): Pt will demonstrate improve safety and remain in room with external cues and no sitter between therapy sessions 3/7 days   Skilled Therapeutic Interventions/Progress Updates:    Pt seen for 1:1 OT session with focus on orientation, awareness, cognitive remediation, and activity tolerance. Pt received supine in bed oriented to person, place, and time with mod cues and use of external aids. Pt required total A for intellectual awareness and orientation to situation. Pt declined bathing and dressing this AM however did initiate toileting. Pt required cues for hand washing following toilet task. Pt ambulated to family room to prepare hot chocolate with mod cues for sustained attention secondary to pt hyperverbal and confabulation. Ambulated to gym and engaged in functional activity of locating letters A-F in alphabetical order, then identifying food that began with each letter. Pt required min cues of alternating attention to task and max cues for identifying foods. Pt with moderate frustration to task, however, able to be redirected and complete task. Pt returned to room with min cues for path finding then completed breakfast with supervision cues.  Therapy Documentation Precautions:  Precautions Precautions: Fall Precaution Comments: Elopement risk, agitation, PEG Restrictions Weight Bearing Restrictions: No General:   Vital Signs:  Pain: Pain Assessment Pain Assessment: No/denies  pain  See FIM for current functional status  Therapy/Group: Individual Therapy  Joice Nazario, Vara GuardianKayla N 05/05/2014, 12:12 PM

## 2014-05-05 NOTE — Progress Notes (Signed)
[facility] PHYSICAL MEDICINE & REHABILITATION     PROGRESS NOTE    Subjective/Complaints: Sitting quietly in his room ROS-- no pain,aches  Objective: Vital Signs: Blood pressure 97/58, pulse 73, temperature 98.5 F (36.9 C), temperature source Oral, resp. rate 18, height 6' (1.829 m), weight 80.74 kg (178 lb), SpO2 98 %. No results found. No results for input(s): WBC, HGB, HCT, PLT in the last 72 hours. No results for input(s): NA, K, CL, GLUCOSE, BUN, CREATININE, CALCIUM in the last 72 hours.  Invalid input(s): CO CBG (last 3)  No results for input(s): GLUCAP in the last 72 hours.  Wt Readings from Last 3 Encounters:  05/05/14 80.74 kg (178 lb)  03/09/14 84.188 kg (185 lb 9.6 oz)  01/23/14 95.255 kg (210 lb)    Physical Exam:  Constitutional: He appears well-developed and well-nourished. He is sleeping. He is easily aroused.  HENT:  Head: Normocephalic.  Neck: supple Cardiovascular: Normal rate and regular rhythm.  Respiratory: Effort normal. No respiratory distress.  GI: Soft. Bowel sounds are normal. He exhibits no distension. There is mild abdominal tenderness, peg site a little red, no drainage or tenderness Musculoskeletal: He exhibits no edema.  Neurological: He is alert today.        Moves all extremities without difficulty. equally moves all 4's. Senses pain in all 4. Oriented to name only. confabulates Skin: Skin is warm and dry.  Psychiatric:  Confused but more appropriate. Showing improved insight and awareness  Assessment/Plan: 1. 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 Physiatrist is providing close team supervision and 24 hour management of active medical problems listed below. Physiatrist and rehab team continue to assess barriers to discharge/monitor patient progress toward functional and medical goals. Appreciate trauma note, contrast seen in  stomach and duodenum although RN states there is some leakage around stoma FIM: FIM - Bathing Bathing Steps Patient Completed: Chest, Abdomen, Front perineal area, Right Arm, Left Arm, Buttocks, Right upper leg, Left upper leg, Left lower leg (including foot), Right lower leg (including foot) Bathing: 6: More than reasonable amount of time  FIM - Upper Body Dressing/Undressing Upper body dressing/undressing steps patient completed: Thread/unthread right sleeve of pullover shirt/dresss, Thread/unthread left sleeve of pullover shirt/dress, Put head through opening of pull over shirt/dress, Pull shirt over trunk Upper body dressing/undressing: 5: Supervision: Safety issues/verbal cues FIM - Lower Body Dressing/Undressing Lower body dressing/undressing steps patient completed: Thread/unthread right underwear leg, Thread/unthread left underwear leg, Pull underwear up/down, Thread/unthread right pants leg, Thread/unthread left pants leg, Pull pants up/down, Don/Doff right sock, Don/Doff left sock Lower body dressing/undressing: 5: Supervision: Safety issues/verbal cues  FIM - Toileting Toileting steps completed by patient: Adjust clothing prior to toileting, Performs perineal hygiene, Adjust clothing after toileting Toileting Assistive Devices: Grab bar or rail for support Toileting: 7: Independent: No helper, no device  FIM - Diplomatic Services operational officerToilet Transfers Toilet Transfers Assistive Devices: Grab bars Toilet Transfers: 6-More than reasonable amt of time, 6-From toilet/BSC  FIM - BankerBed/Chair Transfer Bed/Chair Transfer Assistive Devices: Arm rests Bed/Chair Transfer: 6: More than reasonable amt of time  FIM - Locomotion: Wheelchair Locomotion: Wheelchair: 0: Activity did not occur FIM - Locomotion: Ambulation Locomotion: Ambulation Assistive Devices: Other (comment) (none) Ambulation/Gait Assistance: 6: Modified independent (Device/Increase time) Locomotion: Ambulation: 6: Travels 150 ft or more  independently/takes more than reasonable amount of time  Comprehension Comprehension Mode: Auditory Comprehension: 4-Understands basic 75 - 89% of the time/requires cueing  10 - 24% of the time  Expression Expression Mode: Verbal Expression: 4-Expresses basic 75 - 89% of the time/requires cueing 10 - 24% of the time. Needs helper to occlude trach/needs to repeat words.  Social Interaction Social Interaction Mode: Not assessed Social Interaction: 3-Interacts appropriately 50 - 74% of the time - May be physically or verbally inappropriate.  Problem Solving Problem Solving: 2-Solves basic 25 - 49% of the time - needs direction more than half the time to initiate, plan or complete simple activities  Memory Memory: 2-Recognizes or recalls 25 - 49% of the time/requires cueing 51 - 75% of the time  Medical Problem List and Plan: 1. Functional deficits secondary to TBI   2. DVT Prophylaxis/Anticoagulation: Pharmaceutical: Lovenox 3. Pain Management: Will continue oxycodone prn. Will likely need premedication as unable to express needs. Will monitor for now.  4. Mood: Unable to gauge at this time due to cognitive deficits. LCSW to follow along for evaluation and support once more appropriate.  5. Neuropsych: This patient is not capable of making decisions on her own behalf. -out of [place] bed, no sitter 6. Skin/Wound Care: pt is mobile 7. Fluids/Electrolytes/Nutrition:megace stoppped .    8. Agitation: confused but less agitated.   -seroquel  -klonopin and librium stopped due to lethargy  -depakote 1000 mg bid -sleeping well  -appreciate psych assessment and input--- 9. ?GERD/odynophagia/dysphagia--D2 diet per SLP- intake improved--no aspiration.   -off tube feeds  -tube placed 2/15--can remove potentially this week (will need to check with surgery first)    LOS (Days) 57 A FACE TO FACE EVALUATION WAS PERFORMED  Monico Sudduth  T 05/05/2014 8:12 AM

## 2014-05-05 NOTE — Progress Notes (Signed)
Speech Language Pathology Daily Session Note  Patient Details  Name: Justin CohoJeffrey L Deakin MRN: [id number] Date of Birth: March 09, 1955  Today's Date: 05/05/2014 SLP Individual Time: 1000-1045 SLP Individual Time Calculation (min): 45 min  Short Term Goals: Short Term Goals: Week 8: SLP Short Term Goal 1 (Week 8): Patient will orient to time, place and situation with Max A multimodal cues.  SLP Short Term Goal 2 (Week 8): Patient will demonstrate sustained attention to functional task for 5 minutes with Max A multimodal cues.  SLP Short Term Goal 3 (Week 8): Patient will demonstrate efficient mastication with trials of Dys. 3 textures with minimal overt s/s of aspiration with Min A multimodal cues. SLP Short Term Goal 4 (Week 8): Patient will consume current diet with minimal overt s/s of aspiration with Mod I for use of swallowing strategies.  Skilled Therapeutic Interventions: Skilled treatment session focused on cognitive goals.  Upon arrival, patient was asleep while supine in bed but was agreeable to participate in treatment session. SLP facilitated session by administering portions of the [facility] Cognitive Assessment (MoCA). Patient required extra time and Mod A multimodal cues to sustain attention to task for ~2 minutes during tasks due to verbosity. Patient completed all tasks with 100% accuracy and recalled 2/5 words after a 5 minute delay. Patient was independently oriented to situation and demonstrated improved intellectual awareness of cognitive deficits. Patient left in room with sheet barrier in place. Continue with current plan of care.    FIM:  Comprehension Comprehension Mode: Auditory Comprehension: 4-Understands basic 75 - 89% of the time/requires cueing 10 - 24% of the time Expression Expression Mode: Verbal Expression: 4-Expresses basic 75 - 89% of the time/requires cueing 10 - 24% of the time. Needs helper to occlude trach/needs to repeat words. Social Interaction Social  Interaction: 3-Interacts appropriately 50 - 74% of the time - May be physically or verbally inappropriate. Problem Solving Problem Solving: 2-Solves basic 25 - 49% of the time - needs direction more than half the time to initiate, plan or complete simple activities Memory Memory: 2-Recognizes or recalls 25 - 49% of the time/requires cueing 51 - 75% of the time  Pain Pain Assessment Pain Assessment: No/denies pain  Therapy/Group: Individual Therapy  Syra Sirmons 05/05/2014, 12:14 PM

## 2014-05-05 NOTE — Progress Notes (Signed)
Physical Therapy Session Note  Patient Details  Name: Justin Pugh MRN: [id number] Date of Birth: [birth year]  Today's Date: 05/05/2014 PT Individual Time: [id number] PT Individual Time Calculation (min): 42 min   Short Term Goals: Week 2:  PT Short Term Goal 1 (Week 2): Patient will perform functional ambulation > 150 ft in community environment with supervision.  PT Short Term Goal 2 (Week 2): Patient will demonstrate ability to safely discharge sitter in room with distant supervision from RN station.  PT Short Term Goal 3 (Week 2): Patient will sustain attention to therapeutic activity x 5 min with supervision.  PT Short Term Goal 4 (Week 2): Patient will demonstrate intellectual awareness in controlled environment with max cues.   Skilled Therapeutic Interventions/Progress Updates:   Session focused on sustained attention, problem solving, recall, topographical orientation, awareness, and orientation with therapeutic activity of scavenger hunt to locate items/person and complete 3 tasks within given timeframe. Patient with increased verbal frustration with task, requiring mod-max multimodal cues for redirection and task completion with increased time. Patient required max cues for socially appropriate behavior to knock on office door of admin staff instead of entering unannounced and to ask for person as patient perseverated on looking in the computer to find her picture in order to locate staff member. Patient was able to recall staff member's office and location of simulated car from activity last week with min cues. Patient oriented to date, day of week, and location and demonstrated intellectual awareness of memory deficits. Patient demonstrated no awareness of given time constraints of task. When asked if patient would like to negotiate stairs for exercise, patient pleasantly and calmly reported, "No, then I might go outside." Upon returning to room, patient noted that breakfast tray still  present and returned tray to meal cart with one cue. Patient prepare cup of hot chocolate with supervision and left in room with barrier sheet in place.  Therapy Documentation Precautions:  Precautions Precautions: Fall Precaution Comments: Elopement risk, agitation, PEG Restrictions Weight Bearing Restrictions: No Pain: Pain Assessment Pain Assessment: No/denies pain Locomotion : Ambulation Ambulation/Gait Assistance: 6: Modified independent (Device/Increase time)   See FIM for current functional status  Therapy/Group: Individual Therapy  [name], Jorrell Kuster A 05/05/2014, 8:52 AM

## 2014-05-06 ENCOUNTER — Inpatient Hospital Stay (HOSPITAL_COMMUNITY): Payer: Medicaid Other | Admitting: Physical Therapy

## 2014-05-06 ENCOUNTER — Inpatient Hospital Stay (HOSPITAL_COMMUNITY): Payer: Medicaid Other

## 2014-05-06 ENCOUNTER — Inpatient Hospital Stay (HOSPITAL_COMMUNITY): Payer: Self-pay | Admitting: Speech Pathology

## 2014-05-06 NOTE — Progress Notes (Signed)
[place] PHYSICAL MEDICINE & REHABILITATION     PROGRESS NOTE    Subjective/Complaints: Sitting   in his room ROS-- no pain,aches  Objective: Vital Signs: Blood pressure 145/82, pulse 58, temperature 98 F (36.7 C), temperature source Oral, resp. rate 17, height 6' (1.829 m), weight 80.74 kg (178 lb), SpO2 98 %. No results found. No results for input(s): WBC, HGB, HCT, PLT in the last 72 hours. No results for input(s): NA, K, CL, GLUCOSE, BUN, CREATININE, CALCIUM in the last 72 hours.  Invalid input(s): CO CBG (last 3)  No results for input(s): GLUCAP in the last 72 hours.  Wt Readings from Last 3 Encounters:  05/05/14 80.74 kg (178 lb)  03/09/14 84.188 kg (185 lb 9.6 oz)  01/23/14 95.255 kg (210 lb)    Physical Exam:  Constitutional: He appears well-developed and well-nourished. He is sleeping. He is easily aroused.  HENT:  Head: Normocephalic.  Neck: supple Cardiovascular: Normal rate and regular rhythm.  Respiratory: Effort normal. No respiratory distress.  GI: Soft. Bowel sounds are normal. He exhibits no distension. There is mild abdominal tenderness, peg site a little red, no drainage or tenderness Musculoskeletal: He exhibits no edema.  Neurological: He is alert today.        Moves all extremities without difficulty. equally moves all 4's. Senses pain in all 4. Oriented to name only. confabulates Skin: Skin is warm and dry.  Psychiatric:  Confused but more appropriate. Showing improved insight and awareness  Assessment/Plan: 1. 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 Physiatrist is providing close team supervision and 24 hour management of active medical problems listed below. Physiatrist and rehab team continue to assess barriers to discharge/monitor patient progress toward functional and medical goals. Appreciate trauma note, contrast seen in stomach  and duodenum although RN states there is some leakage around stoma FIM: FIM - Bathing Bathing Steps Patient Completed: Chest, Abdomen, Front perineal area, Right Arm, Left Arm, Buttocks, Right upper leg, Left upper leg, Left lower leg (including foot), Right lower leg (including foot) Bathing: 6: More than reasonable amount of time  FIM - Upper Body Dressing/Undressing Upper body dressing/undressing steps patient completed: Thread/unthread right sleeve of pullover shirt/dresss, Thread/unthread left sleeve of pullover shirt/dress, Put head through opening of pull over shirt/dress, Pull shirt over trunk Upper body dressing/undressing: 5: Supervision: Safety issues/verbal cues FIM - Lower Body Dressing/Undressing Lower body dressing/undressing steps patient completed: Thread/unthread right underwear leg, Thread/unthread left underwear leg, Pull underwear up/down, Thread/unthread right pants leg, Thread/unthread left pants leg, Pull pants up/down, Don/Doff right sock, Don/Doff left sock Lower body dressing/undressing: 5: Supervision: Safety issues/verbal cues  FIM - Toileting Toileting steps completed by patient: Adjust clothing prior to toileting, Performs perineal hygiene, Adjust clothing after toileting Toileting Assistive Devices: Grab bar or rail for support Toileting: 7: Independent: No helper, no device  FIM - Diplomatic Services operational officerToilet Transfers Toilet Transfers Assistive Devices: Grab bars Toilet Transfers: 6-More than reasonable amt of time, 6-From toilet/BSC  FIM - BankerBed/Chair Transfer Bed/Chair Transfer Assistive Devices: Arm rests Bed/Chair Transfer: 6: More than reasonable amt of time  FIM - Locomotion: Wheelchair Locomotion: Wheelchair: 0: Activity did not occur FIM - Locomotion: Ambulation Locomotion: Ambulation Assistive Devices: Other (comment) (none) Ambulation/Gait Assistance: 6: Modified independent (Device/Increase time) Locomotion: Ambulation: 6: Travels 150 ft or more independently/takes  more than reasonable amount of time  Comprehension Comprehension Mode: Auditory Comprehension: 4-Understands basic 75 - 89% of the time/requires  cueing 10 - 24% of the time  Expression Expression Mode: Verbal Expression: 4-Expresses basic 75 - 89% of the time/requires cueing 10 - 24% of the time. Needs helper to occlude trach/needs to repeat words.  Social Interaction Social Interaction Mode: Not assessed Social Interaction: 4-Interacts appropriately 75 - 89% of the time - Needs redirection for appropriate language or to initiate interaction.  Problem Solving Problem Solving: 2-Solves basic 25 - 49% of the time - needs direction more than half the time to initiate, plan or complete simple activities  Memory Memory: 2-Recognizes or recalls 25 - 49% of the time/requires cueing 51 - 75% of the time  Medical Problem List and Plan: 1. Functional deficits secondary to TBI   2. DVT Prophylaxis/Anticoagulation: Pharmaceutical: Lovenox 3. Pain Management: Will continue oxycodone prn. Will likely need premedication as unable to express needs. Will monitor for now.  4. Mood: Unable to gauge at this time due to cognitive deficits. LCSW to follow along for evaluation and support once more appropriate.  5. Neuropsych: This patient is not capable of making decisions on her own behalf. -out of [place] bed, no sitter 6. Skin/Wound Care: pt is mobile 7. Fluids/Electrolytes/Nutrition:megace stoppped .    8. Agitation: confused but less agitated.   -seroquel  -klonopin and librium stopped due to lethargy  -depakote 1000 mg bid -sleeping well  -appreciate psych assessment and input--- 9. ?GERD/odynophagia/dysphagia--D2 diet per SLP- intake improved--no aspiration.   -off tube feeds  -tube placed 2/15--discussed with surgery Monday. They probably will want to wait until at least next week to remove.    LOS (Days) 58 A FACE TO FACE EVALUATION WAS  PERFORMED  Justin Pugh T 05/06/2014 7:58 AM

## 2014-05-06 NOTE — Progress Notes (Signed)
Occupational Therapy Session Note  Patient Details  Name: Justin Pugh MRN: [id number] Date of Birth: 1955-10-25  Today's Date: 05/06/2014 OT Individual Time: [id number] OT Individual Time Calculation (min): 45 min    Short Term Goals: Week 5:  OT Short Term Goal 1 (Week 5): Pt will demonstrate sustained attention to structured task (not ADL) for 10 min with mod cues  OT Short Term Goal 2 (Week 5): Pt will recall 3 activities completed in previous therapy session with mod cues OT Short Term Goal 3 (Week 5): Pt will demonstrate improve safety and remain in room with external cues and no sitter between therapy sessions 3/7 days   Skilled Therapeutic Interventions/Progress Updates:    Pt seen for 1:1 OT session with focus on orientation, awareness, cognitive remediation, and functional mobility. Pt received asleep in bed, requiring max cues of encouragement for participation secondary to pt wanting to sleep. Pt oriented to person, place, and time with min cues and use of external aid. Pt demonstrating some improvement on intellectual awareness stating "I scratched my head" however continues to deny and cognitive deficits. Discussed [employer] Day and pt recalled needing to wear green that day "so no one can pinch me." Pt perseverative on breakfast and required max cues for problem solving to look for breakfast tray. Pt recalled metal cart with trays and required min cues for locating name on tray. Pt carried tray back to room and ate breakfast at supervision level. Pt requesting to return to bed, however wanted tray removed from room. Required mod cues for pt to initiate taking tray back to cart. Pt returned tray then ambulated back to room and left with all needs in reach.   Therapy Documentation Precautions:  Precautions Precautions: Fall Precaution Comments: Elopement risk, agitation, PEG Restrictions Weight Bearing Restrictions: No General:   Vital Signs: Therapy Vitals Temp: 98 F  (36.7 C) Temp Source: Oral Pulse Rate: (!) 58 Resp: 17 BP: (!) 145/82 mmHg Patient Position (if appropriate): Lying Oxygen Therapy SpO2: 98 % O2 Device: Not Delivered Pain: No report of pain  See FIM for current functional status  Therapy/Group: Individual Therapy  Justin Pugh, Justin Pugh 05/06/2014, 8:51 AM

## 2014-05-06 NOTE — Progress Notes (Signed)
Speech Language Pathology Daily Session Note  Patient Details  Name: Justin Pugh MRN: [id number] Date of Birth: 1955-07-29  Today's Date: 05/06/2014 SLP Individual Time: 1000-1030 SLP Individual Time Calculation (min): 30 min  Short Term Goals: Week 6: SLP Short Term Goal 1 (Week 6): Patient will consume trials of thin liquids with minimal overt s/s of aspiration with Min A multimodal cues for use of swallowing strategies.  SLP Short Term Goal 1 - Progress (Week 6): Met SLP Short Term Goal 2 (Week 6): Patient will consume trials of Dys. 1 textures with minimal overt s/s of aspiration with Min A multimodal cues.  SLP Short Term Goal 2 - Progress (Week 6): Met Week 7: SLP Short Term Goal 1 (Week 7): Patient will orient to time, place and situation with Max A multimodal cues.  SLP Short Term Goal 2 (Week 7): Patient will demonstrate sustained attention to functional task for 2 minutes with Max A multimodal cues.  SLP Short Term Goal 3 (Week 7): Patient will demonstrate efficient mastication with trials of Dys. 2  textures with minimal overt s/s of aspiration with Min A multimodal cues. SLP Short Term Goal 4 (Week 7): Patient will consume trials of thin liquids with minimal overt s/s of aspiration with supervision multimodal cues for use of swallowing strategies.   Skilled Therapeutic Interventions:  Pt was seen for skilled ST targeting cognitive goals.  Upon arrival, pt was seated upright in recliner, awake, alert, and pleasantly interactive.  SLP facilitated the session by administering various portions of the MoCA for ongoing diagnostic assessment of cognitive-linguistic function.  Pt was able to complete serial subtraction math calculations for 100% accuracy with min-mod assist for planning, organization, and error awareness as well as written visual aids to compensate for working memory deficits.  Pt required mod-max cuing for redirection during a generative naming task due to perseverative  responses, poor topic maintenance, and decreased thought organization and mental flexibility.  Continue per current plan of care.    FIM:  Comprehension Comprehension Mode: Auditory Comprehension: 4-Understands basic 75 - 89% of the time/requires cueing 10 - 24% of the time Expression Expression Mode: Verbal Expression: 4-Expresses basic 75 - 89% of the time/requires cueing 10 - 24% of the time. Needs helper to occlude trach/needs to repeat words. Social Interaction Social Interaction: 3-Interacts appropriately 50 - 74% of the time - May be physically or verbally inappropriate. Problem Solving Problem Solving: 2-Solves basic 25 - 49% of the time - needs direction more than half the time to initiate, plan or complete simple activities Memory Memory: 2-Recognizes or recalls 25 - 49% of the time/requires cueing 51 - 75% of the time  Pain Pain Assessment Pain Assessment: No/denies pain  Therapy/Group: Individual Therapy  Justin Pugh, Selinda Orion 05/06/2014, 4:32 PM
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
## 2014-05-06 NOTE — Progress Notes (Signed)
Physical Therapy Weekly Progress Note  Patient Details  Name: Justin Pugh MRN: [id number] Date of Birth: November 27, 1955  Today's Date: 05/06/2014 PT Individual Time: 0900-0944 PT Individual Time Calculation (min): 44 min   Skilled Therapeutic Interventions/Progress Updates:   Patient received asleep in bed, easily aroused. Session focused on awareness, sustained attention, orientation, and therapeutic conversation with focus on current events facilitated by daily newspaper and memory, sequencing, organization, and problem solving with modified resume activity. Patient prepared drinks in family room with supervision and ambulated to day room to sit at table for remainder of session. Patient required min cues to sustain attention to newspaper x 10 min in minimally distracting environment with decreased comprehension of front page article (stated [name] was dead) but improved comprehension of different state primaries results. Patient required max cues to complete modified resume activity with therapist recording previous jobs and approximate number of years followed by arranging jobs in chronological order. Patient remained calm, pleasant, and cooperative throughout session and left sitting in room with barrier sheet in place.   Therapy Documentation Precautions:  Precautions Precautions: Fall Precaution Comments: Elopement risk, agitation, PEG Restrictions Weight Bearing Restrictions: No Pain: Pain Assessment Pain Assessment: No/denies pain Locomotion : Ambulation Ambulation/Gait Assistance: 6: Modified independent (Device/Increase time)   See FIM for current functional status  Therapy/Group: Individual Therapy  [name], Keylee Shrestha A 05/06/2014, 9:48 AM

## 2014-05-07 ENCOUNTER — Inpatient Hospital Stay (HOSPITAL_COMMUNITY): Payer: Self-pay | Admitting: Occupational Therapy

## 2014-05-07 ENCOUNTER — Inpatient Hospital Stay (HOSPITAL_COMMUNITY): Payer: Medicaid Other

## 2014-05-07 NOTE — Progress Notes (Signed)
Speech Language Pathology Weekly Progress and Session Note  Patient Details  Name: Justin Pugh MRN: [id number] Date of Birth: 1955-02-28  Beginning of progress report period: April 30, 2014 End of progress report period: May 07, 2014  Today's Date: 05/07/2014 SLP Individual Time: 2297-9892 SLP Individual Time Calculation (min): 45 min  Short Term Goals: Week 8: SLP Short Term Goal 1 (Week 8): Patient will orient to time, place and situation with Max A multimodal cues. (Progressing towards goal) SLP Short Term Goal 2 (Week 8): Patient will demonstrate sustained attention to functional task for 5 minutes with Max A multimodal cues.  (Met) SLP Short Term Goal 3 (Week 8): Patient will demonstrate efficient mastication with trials of Dys. 3 textures with minimal overt s/s of aspiration with Min A multimodal cues. (Progressing towards goal) SLP Short Term Goal 4 (Week 8): Patient will consume current diet with minimal overt s/s of aspiration with Mod I for use of swallowing strategies. (Met)    New Short Term Goals: Week 9: SLP Short Term Goal 1 (Week 9): Patient will orient to time, place and situation with Max A multimodal cues for use of external/environmental aids.  SLP Short Term Goal 2 (Week 9): Patient will sustain attention to a functional task for 7-10 minutes with Max A multimodal cues for redirection.   SLP Short Term Goal 3 (Week 9): Patient will masticate trials of Dys. 3 textures with Min A multimodal cues for sustained attention to bolus and clearance of residuals from oral cavity across 3 consecutive sessions.  SLP Short Term Goal 4 (Week 9): Patient will plan, organize and execute problem solving during basic to semi-complex functional tasks with Mod assist verbal cues for use of compensatory strategies.     Weekly Progress Updates:  Pt made functional gains this reporting period and has met 3 out of 4 short term goals due to improved sustained attention.  Pt is  completing basic cognitive tasks with mod-max assist verbal cues for organization, error awareness, working memory, and redirection when in moderately distracting environments.  Pt is currently mod I for use of swallowing precautions when consuming dys 2 textures and thin liquids.  Trials of dys 3 textures were not attempted during this reporting period due to SLP treatment focus on cognition and carryover of use of swallowing precautions with current diet; therefore, goals for trials of advanced consistencies will be carried over into next reporting period.  Pt education is ongoing; no family present at this time for training and pt is awaiting placement at assisted living facility with locked unit.  Pt would continue to benefit from skilled ST while inpatient in order to maximize functional independence and reduce burden of care prior to discharge.  Continue to recommend that pt have 24/7 supervision, assistance for medication and financial management, and SLP follow up at next level of care to continue addressing goals for cognition and dysphagia.     Intensity: Minumum of 1-2 x/day, 30 to 90 minutes Frequency: 3 to 5 out of 7 days Duration/Length of Stay: TBD due to D/C to ALF with a locked unit  Treatment/Interventions: Dysphagia/aspiration precaution training;Environmental controls;Functional tasks;Patient/family education;Therapeutic Activities;Cueing hierarchy;Cognitive remediation/compensation;Internal/external aids   Daily Session  Skilled Therapeutic Interventions: Pt was seen for skilled ST targeting cognitive goals.  Upon arrival, pt was seated upright in recliner, awake, alert, and pleasantly interactive.  Pt initially required min verbal cues for redirection prior to initiation of structured therapeutic tasks due to tangential and confabulatory verbal output.  SLP tasked pt with a functional task of making copies and reorganizing materials in the SLP treatment room to target pt's sustained  attention and basic problem solving.  Pt required mod verbal cues to selectively attend to task for ~5 minutes in a moderately distracting environment while making copies.  Pt then sorted copies into 4 groups with mod verbal and visual cues for sustained attention and organization, which SLP faded to supervision with increased task familiarity.  SLP increased task complexity with interruptions to further challenge attention and problem solving with pt still able to complete the remainder of the task with supervision.  Pt recalled route to and from the copy room with supervision despite additional interruptions provided by SLP to challenge working memory.  Pt returned to room with sheet barrier replaced.  Pt is demonstrating functional progress towards meeting long term goals.  Short term goals updated on this date to reflect current progress and plan of care.       FIM:  Comprehension Comprehension Mode: Auditory Comprehension: 4-Understands basic 75 - 89% of the time/requires cueing 10 - 24% of the time Expression Expression Mode: Verbal Expression: 4-Expresses basic 75 - 89% of the time/requires cueing 10 - 24% of the time. Needs helper to occlude trach/needs to repeat words. Social Interaction Social Interaction: 3-Interacts appropriately 50 - 74% of the time - May be physically or verbally inappropriate. Problem Solving Problem Solving: 3-Solves basic 50 - 74% of the time/requires cueing 25 - 49% of the time Memory Memory: 2-Recognizes or recalls 25 - 49% of the time/requires cueing 51 - 75% of the time  Pain Pain Assessment Pain Assessment: No/denies pain  Therapy/Group: Individual Therapy  , Selinda Orion 05/07/2014, 4:18 PM

## 2014-05-07 NOTE — Progress Notes (Signed)
[place] PHYSICAL MEDICINE & REHABILITATION     PROGRESS NOTE    Subjective/Complaints: No new issues ROS-- no pain,aches  Objective: Vital Signs: Blood pressure 104/61, pulse 58, temperature 97.6 F (36.4 C), temperature source Oral, resp. rate 18, height 6' (1.829 m), weight 87.091 kg (192 lb), SpO2 100 %. No results found. No results for input(s): WBC, HGB, HCT, PLT in the last 72 hours. No results for input(s): NA, K, CL, GLUCOSE, BUN, CREATININE, CALCIUM in the last 72 hours.  Invalid input(s): CO CBG (last 3)  No results for input(s): GLUCAP in the last 72 hours.  Wt Readings from Last 3 Encounters:  05/06/14 87.091 kg (192 lb)  03/09/14 84.188 kg (185 lb 9.6 oz)  01/23/14 95.255 kg (210 lb)    Physical Exam:  Constitutional: He appears well-developed and well-nourished. He is sleeping. He is easily aroused.  HENT:  Head: Normocephalic.  Neck: supple Cardiovascular: Normal rate and regular rhythm.  Respiratory: Effort normal. No respiratory distress.  GI: Soft. Bowel sounds are normal. He exhibits no distension. There is mild abdominal tenderness, peg site a little red, no drainage or tenderness Musculoskeletal: He exhibits no edema.  Neurological: He is alert today.        Moves all extremities without difficulty.  confabulates Skin: Skin is warm and dry.  Psychiatric:  Confused but more appropriate. Showing improved insight and awareness  Assessment/Plan: 1. 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 Physiatrist is providing close team supervision and 24 hour management of active medical problems listed below. Physiatrist and rehab team continue to assess barriers to discharge/monitor patient progress toward functional and medical goals. Appreciate trauma note, contrast seen in stomach and duodenum although RN states there is some leakage around  stoma FIM: FIM - Bathing Bathing Steps Patient Completed: Chest, Abdomen, Front perineal area, Right Arm, Left Arm, Buttocks, Right upper leg, Left upper leg, Left lower leg (including foot), Right lower leg (including foot) Bathing: 6: More than reasonable amount of time  FIM - Upper Body Dressing/Undressing Upper body dressing/undressing steps patient completed: Thread/unthread right sleeve of pullover shirt/dresss, Thread/unthread left sleeve of pullover shirt/dress, Put head through opening of pull over shirt/dress, Pull shirt over trunk Upper body dressing/undressing: 5: Supervision: Safety issues/verbal cues FIM - Lower Body Dressing/Undressing Lower body dressing/undressing steps patient completed: Thread/unthread right underwear leg, Thread/unthread left underwear leg, Pull underwear up/down, Thread/unthread right pants leg, Thread/unthread left pants leg, Pull pants up/down, Don/Doff right sock, Don/Doff left sock Lower body dressing/undressing: 5: Supervision: Safety issues/verbal cues  FIM - Toileting Toileting steps completed by patient: Adjust clothing prior to toileting, Performs perineal hygiene, Adjust clothing after toileting Toileting Assistive Devices: Grab bar or rail for support Toileting: 7: Independent: No helper, no device  FIM - Diplomatic Services operational officer Devices: Grab bars Toilet Transfers: 6-More than reasonable amt of time, 6-From toilet/BSC  FIM - Banker Devices: Arm rests Bed/Chair Transfer: 6: More than reasonable amt of time  FIM - Locomotion: Wheelchair Locomotion: Wheelchair: 0: Activity did not occur FIM - Locomotion: Ambulation Locomotion: Ambulation Assistive Devices: Other (comment) (none) Ambulation/Gait Assistance: 6: Modified independent (Device/Increase time) Locomotion: Ambulation: 6: Travels 150 ft or more independently/takes more than reasonable amount of  time  Comprehension Comprehension Mode: Auditory Comprehension: 4-Understands basic 75 - 89% of the time/requires cueing 10 - 24% of the time  Expression Expression Mode: Verbal Expression: 4-Expresses basic  75 - 89% of the time/requires cueing 10 - 24% of the time. Needs helper to occlude trach/needs to repeat words.  Social Interaction Social Interaction Mode: Not assessed Social Interaction: 3-Interacts appropriately 50 - 74% of the time - May be physically or verbally inappropriate.  Problem Solving Problem Solving: 2-Solves basic 25 - 49% of the time - needs direction more than half the time to initiate, plan or complete simple activities  Memory Memory: 2-Recognizes or recalls 25 - 49% of the time/requires cueing 51 - 75% of the time  Medical Problem List and Plan: 1. Functional deficits secondary to TBI   2. DVT Prophylaxis/Anticoagulation: Pharmaceutical: Lovenox 3. Pain Management: Will continue oxycodone prn. Will likely need premedication as unable to express needs. Will monitor for now.  4. Mood: Unable to gauge at this time due to cognitive deficits. LCSW to follow along for evaluation and support once more appropriate.  5. Neuropsych: This patient is not capable of making decisions on her own behalf. -out of vail bed, no sitter 6. Skin/Wound Care: pt is mobile 7. Fluids/Electrolytes/Nutrition:megace stoppped .    8. Agitation: confused but less agitated.   -seroquel  -klonopin and librium stopped due to lethargy  -depakote 1000 mg bid -sleeping well  -appreciate psych assessment and input--- 9. ?GERD/odynophagia/dysphagia--D2 diet per SLP- intake improved--no aspiration.   -off tube feeds  -tube placed 2/15--discussed with surgery Monday. They probably will want to wait until at least next week to remove.    LOS (Days) 59 A FACE TO FACE EVALUATION WAS PERFORMED  Bernie Fobes T 05/07/2014 8:44 AM
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
## 2014-05-07 NOTE — Progress Notes (Signed)
Occupational Therapy Weekly Progress Note  Patient Details  Name: Justin Pugh MRN: [id number] Date of Birth: 1955/04/22  Beginning of progress report period: April 29, 2014 End of progress report period: May 07, 2014  Today's Date: 05/07/2014 OT Individual Time: 5625-6389 OT Individual Time Calculation (min): 45 min    Patient has met 2 of 3 short term goals.  Patient has made some progress cognitively during this reporting period. Patient is oriented to person, place, and time with min cues and use of external aid, however requires total A for orientation to situation. Patient inconsistently demonstrates some intellectual awareness with max cues. Patient demonstrates sustained attention to self-care tasks up to 10 min with min cues and requires max multimodal cues for sustained attention to structured tasks for 1-2 minutes. Patient required max cues for basic problem solving and sequencing during functional activities. Patient continues to demonstrate no signs of agitation during therapy sessions and has remain Mod I in room with sheet as external cue to remain in room. Patient is currently demonstrating behaviors consistent with [place] Level V.  Patient continues to demonstrate the following deficits: decreased safety awareness, decreased balance strategies, decreased attention, decreased intellectual awareness, decreased emergent awareness, absent anticipatory awareness, decreased memory, decreased problem solving, decreased sequencing, decreased organization, decreased activity tolerance and therefore will continue to benefit from skilled OT intervention to enhance overall performance with cognitive remediation, attetion, BADLs, activity tolerance, and safety.  Patient progressing toward long term goals..  Continue plan of care.  OT Short Term Goals Week 5:  OT Short Term Goal 1 (Week 5): Pt will demonstrate sustained attention to structured task (not ADL) for 10 min with mod cues  OT  Short Term Goal 1 - Progress (Week 5): Not met OT Short Term Goal 2 (Week 5): Pt will recall 3 activities completed in previous therapy session with mod cues OT Short Term Goal 2 - Progress (Week 5): Met OT Short Term Goal 3 (Week 5): Pt will demonstrate improve safety and remain in room with external cues and no sitter between therapy sessions 3/7 days  OT Short Term Goal 3 - Progress (Week 5): Met Week 6:  OT Short Term Goal 1 (Week 6): Patient will sustain attention to functional task (other than ADL) for 5 min with max multimodal cues OT Short Term Goal 2 (Week 6): Patient will retrieve all bathing and dressing needs with mod cues for organization OT Short Term Goal 3 (Week 6): Patient will identify one cognitive deficit with max cues  Skilled Therapeutic Interventions/Progress Updates:    Pt seen for ADL retraining with focus on sustained attention, problem solving, organization, functional mobility, and memory. Pt received supine in bed agreeable to showering this AM. Pt required increased time and max cues to retrieve all bathing and dressing needs. Completed bathing in standing at mod I level and pt initiating clean-up. Returned to room and pt perseverating on not have clean clothing. Pt required mod cues to problem solve to wash clothing. Completed task at supervision level with increased time and no cues for sequencing. Pt prepared hot chocolate then returned to room. Pt oriented to person, place, and time with min cues. Pt required multimodal cues to identify holiday and total A for orientation to situation. At end of session pt left in room eating breakfast.   Therapy Documentation Precautions:  Precautions Precautions: Fall Precaution Comments: Elopement risk, agitation, PEG Restrictions Weight Bearing Restrictions: No General:   Vital Signs: Therapy Vitals Temp: 97.6 F (36.4  C) Temp Source: Oral Pulse Rate: (!) 58 BP: 104/61 mmHg Patient Position (if appropriate):  Sitting Oxygen Therapy SpO2: 100 % O2 Device: Not Delivered Pain:  No report of pain  See FIM for current functional status  Therapy/Group: Individual Therapy  Duayne Cal 05/07/2014, 6:35 AM

## 2014-05-08 ENCOUNTER — Inpatient Hospital Stay (HOSPITAL_COMMUNITY): Payer: Self-pay | Admitting: Speech Pathology

## 2014-05-08 ENCOUNTER — Inpatient Hospital Stay (HOSPITAL_COMMUNITY): Payer: Medicaid Other | Admitting: *Deleted

## 2014-05-08 ENCOUNTER — Inpatient Hospital Stay (HOSPITAL_COMMUNITY): Payer: Self-pay

## 2014-05-08 NOTE — Progress Notes (Signed)
Physical Therapy Session Note  Patient Details  Name: Justin Pugh MRN: [id number] Date of Birth: [birth year]  Today's Date: 05/08/2014 PT Individual Time: 0800-0845 PT Individual Time Calculation (min): 45 min   Short Term Goals: Week 3:  PT Short Term Goal 1 (Week 3): Patient will perform functional ambulation > 150 ft in community environment with supervision.  PT Short Term Goal 1 - Progress (Week 3): Met PT Short Term Goal 2 (Week 3): Patient will sustain attention to therapeutic activity x 10 min with supervision.  PT Short Term Goal 2 - Progress (Week 3): Met PT Short Term Goal 3 (Week 3): Patient will demonstrate intellectual awareness in controlled environment with max cues.  PT Short Term Goal 3 - Progress (Week 3): Progressing toward goal  Skilled Therapeutic Interventions/Progress Updates:    Patient received standing at sink, brushing teeth. Session focused on selective attention, short term memory, problem solving, and organization with multiple errands task and community reintegration. Patient given multiple errands task with 3 two step tasks focused on short term recall; task also included time restraints and rules. Task: 1) Find 3 yellow clothespins 2) Find scale and weigh yourself 3) Find woman named Audra and tell her your weight. Patient requires min cues for memory of tasks/identifiers and mod cues for problem solving (specifically, how to locate person, etc.  Patient appropriate for off the unit activity, performed functional ambulation on/off unit and elevators, up/down flight of stairs, around hospital lobby, browsing purse/jewelry sale, and outside on uneven surfaces/inclines/declines, etc. Patient engaged in appropriate therapeutic conversation about costs of items and applicable money management and performed ambulation outside appropriately without attempts of elopement and without agitation/increased frustration tolerance when redirected back inside hospital. Patient  mod I for ambulation outside, but requires supervision for safety due to elopement in the past. Patient returned to room and left with safety sheet in place.  Therapy Documentation Precautions:  Precautions Precautions: None Precaution Comments: Elopement risk, PEG Restrictions Weight Bearing Restrictions: No Pain: Pain Assessment Pain Assessment: No/denies pain Pain Score: 0-No pain Locomotion : Ambulation Ambulation/Gait Assistance: 6: Modified independent (Device/Increase time)   See FIM for current functional status  Therapy/Group: Individual Therapy  [name]. Celese Banner, PT, DPT 05/08/2014, 8:48 AM

## 2014-05-08 NOTE — Progress Notes (Signed)
Speech Language Pathology Daily Session Note  Patient Details  Name: Justin Pugh MRN: [id number] Date of Birth: August 13, 1955  Today's Date: 05/08/2014 SLP Individual Time: 1356-1446 SLP Individual Time Calculation (min): 50 min  Short Term Goals: Week 9: SLP Short Term Goal 1 (Week 9): Patient will orient to time, place and situation with Max A multimodal cues for use of external/environmental aids.  SLP Short Term Goal 2 (Week 9): Patient will sustain attention to a functional task for 7-10 minutes with Max A multimodal cues for redirection.   SLP Short Term Goal 3 (Week 9): Patient will masticate trials of Dys. 3 textures with Min A multimodal cues for sustained attention to bolus and clearance of residuals from oral cavity across 3 consecutive sessions.  SLP Short Term Goal 4 (Week 9): Patient will plan, organize and execute problem solving during basic to semi-complex functional tasks with Mod assist verbal cues for use of compensatory strategies.   Skilled Therapeutic Interventions:  Pt was seen for skilled ST targeting goals for dysphagia and cognition.  Upon arrival, pt was seated upright in recliner, awake, alert, and pleasantly interactive.  Pt continues to present with verbosity and required repeated max cues for redirection to SLP's questions for orientation.   Following the abovementioned cues, pt was able to briefly orient to situation.  SLP facilitated the session with trials of dys 3 textures to continue working towards diet advancement.  Pt required encouragement to consume trials as he preferred to save/stockpile food; however, pt was agreeable to eating half of a graham cracker and saving the rest  for a snack later.  Pt demonstrated effective mastication and clearance of residual solids from the oral cavity post swallow with small amounts of dys 3 textures. No overt s/s of aspiration were noted with solids or thin liquids.  Pt may benefit from a trial meal tray of advanced  textures to assess readiness for texture progression.  Continue per current plan of care.     FIM:  Comprehension Comprehension Mode: Auditory Comprehension: 4-Understands basic 75 - 89% of the time/requires cueing 10 - 24% of the time Expression Expression Mode: Verbal Expression: 4-Expresses basic 75 - 89% of the time/requires cueing 10 - 24% of the time. Needs helper to occlude trach/needs to repeat words. Social Interaction Social Interaction: 4-Interacts appropriately 75 - 89% of the time - Needs redirection for appropriate language or to initiate interaction. Problem Solving Problem Solving: 3-Solves basic 50 - 74% of the time/requires cueing 25 - 49% of the time Memory Memory: 2-Recognizes or recalls 25 - 49% of the time/requires cueing 51 - 75% of the time FIM - Eating Eating Activity: 5: Supervision/cues  Pain Pain Assessment Pain Assessment: No/denies pain  Therapy/Group: Individual Therapy  Cherylee Rawlinson, Melanee SpryNicole L 05/08/2014, 3:46 PM

## 2014-05-08 NOTE — Progress Notes (Signed)
Occupational Therapy Session Note  Patient Details  Name: Justin CohoJeffrey L Vandunk MRN: [id number] Date of Birth: [birth year]  Today's Date: 05/08/2014 OT Individual Time: 1000-1100 OT Individual Time Calculation (min): 60 min    Short Term Goals: Week 6:  OT Short Term Goal 1 (Week 6): Patient will sustain attention to functional task (other than ADL) for 5 min with max multimodal cues OT Short Term Goal 2 (Week 6): Patient will retrieve all bathing and dressing needs with mod cues for organization OT Short Term Goal 3 (Week 6): Patient will identify one cognitive deficit with max cues  Skilled Therapeutic Interventions/Progress Updates:    Pt seen for 1:1 OT session with focus on sustained and alternating attention, functional mobility, problem solving, memory, and activity tolerance. Pt received sitting in recliner chair. Utilized schedule with mod cues to determine currently therapy session and length of therapy. Pt accurately recalled 2 activities completed in previous therapy session. Pt demonstrated improved intellectual awareness stating "I was told I hurt my brain" however unable to verbalize any deficits as a result of the injury. Engaged in letter game with pt locating letters A-H in alphabetical order with max cues. Pt required max cues to identify food that began with letter and mod cues for alternating attention to tasks. Provided written list of therapists name and pt located mail box to retrieve mail with increased time and min cues. Pt initiating use of list throughout activity as memory aid. Pt returned all mail to therapists desk then ambulated to ADL apartment with min cues for path finding. Prepared instant pudding with max cue for following directions on package. Pt returned to room and left with all needs in reach.    Therapy Documentation Precautions:  Precautions Precautions: None Precaution Comments: Elopement risk, PEG Restrictions Weight Bearing Restrictions: No General:    Vital Signs:  Pain: Pain Assessment Pain Assessment: No/denies pain Pain Score: 0-No pain  See FIM for current functional status  Therapy/Group: Individual Therapy  Daneil Danerkinson, Margarete Horace N 05/08/2014, 11:30 AM

## 2014-05-08 NOTE — Progress Notes (Signed)
Physical Therapy Weekly Progress Note  Patient Details  Name: Justin Pugh MRN: [id number] Date of Birth: May 27, 1955  Beginning of progress report period: April 29, 2014 End of progress report period: May 08, 2014  Patient has met 3 of 4 short term goals. Patient is currently demonstrating behaviors consistent with [place] Level V. Patient has made progress this period due to improved sustained attention, task completion and participation in therapeutic tasks, and short term recall. Patient is mod I in controlled and home environments for mobility and requires supervision for community mobility due to previous elopement attempts and cognitive impairments. Patient requires min cues for redirection with basic structured tasks in minimally distracting environment and mod-max cues for redirection, sequencing, and organization with increased cognitive challenge and moderately distracting environment. Patient has demonstrated no safety concerns while mod I in room with barrier sheet in place. Continue to recommend 24/7 supervision at next level of care due to cognition and safety concerns.   Patient continues to demonstrate the following deficits: decreased safety awareness, decreased sustained, selective, divided and attention, decreased intellectual awareness, absent emergent and anticipatory awareness, decreased memory, decreased problem solving, decreased sequencing, decreased organization, decreased activity tolerance and therefore will continue to benefit from skilled PT intervention to enhance overall performance with cognitive remediation, activity tolerance, balance, ability to compensate for deficits, attention, awareness and safety.  Patient progressing toward long term goals.  Continue plan of care.    Week 2:  PT Short Term Goal 1 (Week 2): Patient will perform functional ambulation > 150 ft in community environment with supervision.  PT Short Term Goal 1 - Progress (Week 2): Met PT Short  Term Goal 2 (Week 2): Patient will demonstrate ability to safely discharge sitter in room with distant supervision from RN station.  PT Short Term Goal 2 - Progress (Week 2): Met PT Short Term Goal 3 (Week 2): Patient will sustain attention to therapeutic activity x 5 min with supervision.  PT Short Term Goal 3 - Progress (Week 2): Met PT Short Term Goal 4 (Week 2): Patient will demonstrate intellectual awareness in controlled environment with max cues.  PT Short Term Goal 4 - Progress (Week 2): Not met Week 3:  PT Short Term Goal 1 (Week 3): Patient will be oriented to situation with max multimodal cues.  PT Short Term Goal 1 - Progress (Week 3): Met PT Short Term Goal 2 (Week 3): Patient will sustain attention to therapeutic activity x 15 min with supervision. PT Short Term Goal 2 - Progress (Week 3): Met PT Short Term Goal 3 (Week 3): Patient will demonstrate intellectual awareness in controlled environment with max cues.  PT Short Term Goal 3 - Progress (Week 3): Progressing toward goal  Laretta Alstrom 05/08/2014, 4:36 PM

## 2014-05-08 NOTE — Progress Notes (Signed)
[place] [facility]IC[facility] & REHABILITATION     PROGRESS NOTE    Subjective/Complaints: Sitting quietly in his room. ROS-- no pain,aches  Objective: Vital Signs: Blood pressure 95/56, pulse 47, temperature 97.9 F (36.6 C), temperature source Oral, resp. rate 18, height 6' (1.829 m), weight 87.091 kg (192 lb), SpO2 100 %. No results found. No results for input(s): WBC, HGB, HCT, PLT in the last 72 hours. No results for input(s): NA, K, CL, GLUCOSE, BUN, CREATININE, CALCIUM in the last 72 hours.  Invalid input(s): CO CBG (last 3)  No results for input(s): GLUCAP in the last 72 hours.  Wt Readings from Last 3 Encounters:  05/06/14 87.091 kg (192 lb)  03/09/14 84.188 kg (185 lb 9.6 oz)  01/23/14 95.255 kg (210 lb)    Physical Exam:  Constitutional: He appears well-developed and well-nourished. He is sleeping. He is easily aroused.  HENT:  Head: Normocephalic.  Neck: supple Cardiovascular: Normal rate and regular rhythm.  Respiratory: Effort normal. No respiratory distress.  GI: Soft. Bowel sounds are normal. He exhibits no distension. There is mild abdominal tenderness, peg site a little red, no drainage or tenderness Musculoskeletal: He exhibits no edema.  Neurological: He is alert today.        Moves all extremities without difficulty.  confabulates Skin: Skin is warm and dry.  Psychiatric:  Confused but more appropriate. Showing improved insight and awareness  Assessment/Plan: 1. 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 Physiatrist is providing close team supervision and 24 hour management of active medical problems listed below. Physiatrist and rehab team continue to assess barriers to discharge/monitor patient progress toward functional and medical goals. Appreciate trauma note, contrast seen in stomach and duodenum although RN states there is some leakage  around stoma FIM: FIM - Bathing Bathing Steps Patient Completed: Chest, Abdomen, Front perineal area, Right Arm, Left Arm, Buttocks, Right upper leg, Left upper leg, Left lower leg (including foot), Right lower leg (including foot) Bathing: 6: More than reasonable amount of time  FIM - Upper Body Dressing/Undressing Upper body dressing/undressing steps patient completed: Thread/unthread right sleeve of pullover shirt/dresss, Thread/unthread left sleeve of pullover shirt/dress, Put head through opening of pull over shirt/dress, Pull shirt over trunk Upper body dressing/undressing: 5: Supervision: Safety issues/verbal cues FIM - Lower Body Dressing/Undressing Lower body dressing/undressing steps patient completed: Thread/unthread right underwear leg, Thread/unthread left underwear leg, Pull underwear up/down, Thread/unthread right pants leg, Thread/unthread left pants leg, Pull pants up/down, Don/Doff right sock, Don/Doff left sock Lower body dressing/undressing: 5: Supervision: Safety issues/verbal cues  FIM - Toileting Toileting steps completed by patient: Adjust clothing prior to toileting, Performs perineal hygiene, Adjust clothing after toileting Toileting Assistive Devices: Grab bar or rail for support Toileting: 7: Independent: No helper, no device  FIM - Diplomatic Services operational officerToilet Transfers Toilet Transfers Assistive Devices: Grab bars Toilet Transfers: 6-More than reasonable amt of time, 6-From toilet/BSC  FIM - BankerBed/Chair Transfer Bed/Chair Transfer Assistive Devices: Arm rests Bed/Chair Transfer: 6: More than reasonable amt of time  FIM - Locomotion: Wheelchair Locomotion: Wheelchair: 0: Activity did not occur FIM - Locomotion: Ambulation Locomotion: Ambulation Assistive Devices: Other (comment) (none) Ambulation/Gait Assistance: 6: Modified independent (Device/Increase time) Locomotion: Ambulation: 6: Travels 150 ft or more independently/takes more than reasonable amount of  time  Comprehension Comprehension Mode: Auditory Comprehension: 4-Understands basic 75 - 89% of the time/requires cueing 10 - 24% of the time  Expression Expression Mode: Verbal Expression:  4-Expresses basic 75 - 89% of the time/requires cueing 10 - 24% of the time. Needs helper to occlude trach/needs to repeat words.  Social Interaction Social Interaction Mode: Not assessed Social Interaction: 3-Interacts appropriately 50 - 74% of the time - May be physically or verbally inappropriate.  Problem Solving Problem Solving: 3-Solves basic 50 - 74% of the time/requires cueing 25 - 49% of the time  Memory Memory: 2-Recognizes or recalls 25 - 49% of the time/requires cueing 51 - 75% of the time  Medical Problem List and Plan: 1. Functional deficits secondary to TBI   2. DVT Prophylaxis/Anticoagulation: Pharmaceutical: Lovenox 3. Pain Management: Will continue oxycodone prn. Will likely need premedication as unable to express needs. Will monitor for now.  4. Mood: Unable to gauge at this time due to cognitive deficits. LCSW to follow along for evaluation and support once more appropriate.  5. Neuropsych: This patient is not capable of making decisions on her own behalf. -out of vail bed, no sitter 6. Skin/Wound Care: pt is mobile 7. Fluids/Electrolytes/Nutrition:megace stoppped .    8. Agitation: confused but less agitated.   -seroquel  -klonopin and librium stopped due to lethargy  -depakote 1000 mg bid -sleeping well  -appreciate psych assessment and input--- 9. ?GERD/odynophagia/dysphagia--D2 diet per SLP- intake improved--no aspiration.   -off tube feeds  -tube placed 2/15--discussed with surgery Monday. They probably will want to wait until at least next week to remove.    LOS (Days) 60 A FACE TO FACE EVALUATION WAS PERFORMED  Jaicob Dia T 05/08/2014 8:18 AM

## 2014-05-09 ENCOUNTER — Inpatient Hospital Stay (HOSPITAL_COMMUNITY): Payer: Medicaid Other | Admitting: Physical Therapy

## 2014-05-09 ENCOUNTER — Inpatient Hospital Stay (HOSPITAL_COMMUNITY): Payer: Self-pay | Admitting: Occupational Therapy

## 2014-05-09 NOTE — Progress Notes (Signed)
Occupational Therapy Session Note  Patient Details  Name: Justin Pugh MRN: [id number] Date of Birth: 04-02-1955  Today's Date: 05/09/2014 OT Individual Time: 1330-1400 OT Individual Time Calculation (min): 30 min    Skilled Therapeutic Interventions/Progress Updates:    1:1 pt participated in functional kitchen task navigating  of making a grocery list with focus on functional problem solving, working memory, selective attention in a quiet environment, intellecual awareness.  Pt with decr mental flexibility - understanding purpose of ADL kitchen.  Pt thinks it is his regular OT's living space since he "showers at her place." Pt continues to be hyperverbal however with mod cuing able to be redirected to complete task. Returned to room with door sheet in place. Pt was oriented to self, place, time and partial situation.  Therapy Documentation Precautions:  Precautions Precautions: None Precaution Comments: Elopement risk, PEG Restrictions Weight Bearing Restrictions: No Pain: Pain Assessment Pain Assessment: No/denies pain  See FIM for current functional status  Therapy/Group: Individual Therapy  Roney MansSmith, Hikeem Andersson [facility] 05/09/2014, 3:06 PM

## 2014-05-09 NOTE — Progress Notes (Signed)
Physical Therapy Session Note  Patient Details  Name: Justin Pugh MRN: [id number] Date of Birth: [birth year]  Today's Date: 05/09/2014 PT Individual Time: 1300-1330 PT Individual Time Calculation (min): 30 min   Short Term Goals: Week 3:  PT Short Term Goal 1 (Week 3): Patient will be oriented to situation with max multimodal cues.  PT Short Term Goal 1 - Progress (Week 3): Met PT Short Term Goal 2 (Week 3): Patient will sustain attention to therapeutic activity x 15 min with supervision. PT Short Term Goal 2 - Progress (Week 3): Met PT Short Term Goal 3 (Week 3): Patient will demonstrate intellectual awareness in controlled environment with max cues.  PT Short Term Goal 3 - Progress (Week 3): Progressing toward goal  Skilled Therapeutic Interventions/Progress Updates:    Neuromuscular Reeducation: Focus of this session was dual tasking and problem solving. 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 PT instructs pt in mental math related to budgeting/finance management of a small business and pt demonstrates ability to add and subtract gross, large numbers accurately, but is only able to give ballpark figures for multiplication mental math and is unable to give an answer to division mental math. When instructed, pt uses screwdriver to remove small bball hoop from wall, as well as calendar, and replace calendar on wall in gym. Pt req cues to look at calendar and note that 2 of the dates are missing. Once he notices, pt initiates problem solving to obtain a piece of paper and scissors, cut it to the correct size, and delegates task of writing correct number (for date) on the paper. Pt is easily distracted from activities and req frequent redirection to mental math task when distracted by other activities. No agitation or hostility noted  during session. Pt passed off to OT at end of session. Continue per PT POC.   Therapy Documentation Precautions:  Precautions Precautions: None Precaution Comments: Elopement risk, PEG Restrictions Weight Bearing Restrictions: No Pain: Pain Assessment Pain Assessment: No/denies pain   See FIM for current functional status  Therapy/Group: Individual Therapy  Elysabeth Aust M 05/09/2014, 3:30 PM

## 2014-05-09 NOTE — Progress Notes (Signed)
[place] PHYSICAL MEDICINE & REHABILITATION     PROGRESS NOTE    Subjective/Complaints: Sitting quietly in his room. ROS-- no pain,aches  Objective: Vital Signs: Blood pressure 105/83, pulse 57, temperature 98.3 F (36.8 C), temperature source Oral, resp. rate 18, height 6' (1.829 m), weight 87.091 kg (192 lb), SpO2 100 %. No results found. No results for input(s): WBC, HGB, HCT, PLT in the last 72 hours. No results for input(s): NA, K, CL, GLUCOSE, BUN, CREATININE, CALCIUM in the last 72 hours.  Invalid input(s): CO CBG (last 3)  No results for input(s): GLUCAP in the last 72 hours.  Wt Readings from Last 3 Encounters:  05/06/14 87.091 kg (192 lb)  03/09/14 84.188 kg (185 lb 9.6 oz)  01/23/14 95.255 kg (210 lb)    Physical Exam:  Constitutional: He appears well-developed and well-nourished. He is sleeping. He is easily aroused.  HENT:  Head: Normocephalic.  Neck: supple Cardiovascular: Normal rate and regular rhythm.  Respiratory: Effort normal. No respiratory distress.  GI: Soft. Bowel sounds are normal. He exhibits no distension. There is mild abdominal tenderness, peg site a little red, no drainage or tenderness Musculoskeletal: He exhibits no edema.  Neurological: He is alert today.        Moves all extremities without difficulty.  confabulates Skin: Skin is warm and dry.  Psychiatric:  Confused but more appropriate. Showing improved insight and awareness  Assessment/Plan: 1. 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 Physiatrist is providing close team supervision and 24 hour management of active medical problems listed below. Physiatrist and rehab team continue to assess barriers to discharge/monitor patient progress toward functional and medical goals. Appreciate trauma note, contrast seen in stomach and duodenum although RN states there is some leakage  around stoma FIM: FIM - Bathing Bathing Steps Patient Completed: Chest, Abdomen, Front perineal area, Right Arm, Left Arm, Buttocks, Right upper leg, Left upper leg, Left lower leg (including foot), Right lower leg (including foot) Bathing: 6: More than reasonable amount of time  FIM - Upper Body Dressing/Undressing Upper body dressing/undressing steps patient completed: Thread/unthread right sleeve of pullover shirt/dresss, Thread/unthread left sleeve of pullover shirt/dress, Put head through opening of pull over shirt/dress, Pull shirt over trunk Upper body dressing/undressing: 5: Supervision: Safety issues/verbal cues FIM - Lower Body Dressing/Undressing Lower body dressing/undressing steps patient completed: Thread/unthread right underwear leg, Thread/unthread left underwear leg, Pull underwear up/down, Thread/unthread right pants leg, Thread/unthread left pants leg, Pull pants up/down, Don/Doff right sock, Don/Doff left sock Lower body dressing/undressing: 5: Supervision: Safety issues/verbal cues  FIM - Toileting Toileting steps completed by patient: Adjust clothing prior to toileting, Performs perineal hygiene, Adjust clothing after toileting Toileting Assistive Devices: Grab bar or rail for support Toileting: 7: Independent: No helper, no device  FIM - Diplomatic Services operational officer Devices: Grab bars Toilet Transfers: 6-More than reasonable amt of time, 6-From toilet/BSC  FIM - Banker Devices: Arm rests Bed/Chair Transfer: 6: Bed > Chair or W/C: No assist, 6: Chair or W/C > Bed: No assist  FIM - Locomotion: Wheelchair Locomotion: Wheelchair: 0: Activity did not occur FIM - Locomotion: Ambulation Locomotion: Ambulation Assistive Devices: Other (comment) (none) Ambulation/Gait Assistance: 6: Modified independent (Device/Increase time) Locomotion: Ambulation: 6: Travels 150 ft or more independently/takes more than reasonable  amount of time  Comprehension Comprehension Mode: Auditory Comprehension: 4-Understands basic 75 - 89% of the time/requires cueing 10 - 24%  of the time  Expression Expression Mode: Verbal Expression: 4-Expresses basic 75 - 89% of the time/requires cueing 10 - 24% of the time. Needs helper to occlude trach/needs to repeat words.  Social Interaction Social Interaction Mode: Not assessed Social Interaction: 4-Interacts appropriately 75 - 89% of the time - Needs redirection for appropriate language or to initiate interaction.  Problem Solving Problem Solving: 3-Solves basic 50 - 74% of the time/requires cueing 25 - 49% of the time  Memory Memory: 2-Recognizes or recalls 25 - 49% of the time/requires cueing 51 - 75% of the time  Medical Problem List and Plan: 1. Functional deficits secondary to TBI   2. DVT Prophylaxis/Anticoagulation: Pharmaceutical: Lovenox 3. Pain Management: Will continue oxycodone prn. Will likely need premedication as unable to express needs. Will monitor for now.  4. Mood: Unable to gauge at this time due to cognitive deficits. LCSW to follow along for evaluation and support once more appropriate.  5. Neuropsych: This patient is not capable of making decisions on her own behalf. -[place] of [place] bed, no sitter 6. Skin/Wound Care: pt is mobile 7. Fluids/Electrolytes/Nutrition:megace stoppped .    8. Agitation: confused but less agitated.   -seroquel  -klonopin and librium stopped due to lethargy  -depakote 1000 mg bid -sleeping well  -appreciate psych assessment and input--- 9. ?GERD/odynophagia/dysphagia--D2 diet per SLP- intake improved--no aspiration.   -off tube feeds  -tube placed 2/15--discussed with surgery Monday. They probably will want to wait until at least next week to remove.    LOS (Days) 61 A FACE TO FACE EVALUATION WAS PERFORMED  SWARTZ,ZACHARY T 05/09/2014 9:09 AM

## 2014-05-10 ENCOUNTER — Inpatient Hospital Stay (HOSPITAL_COMMUNITY): Payer: Medicaid Other | Admitting: *Deleted

## 2014-05-10 ENCOUNTER — Inpatient Hospital Stay (HOSPITAL_COMMUNITY): Payer: Medicaid Other | Admitting: Physical Therapy

## 2014-05-10 NOTE — Progress Notes (Signed)
[facility] PHYSICAL MEDICINE & REHABILITATION     PROGRESS NOTE    Subjective/Complaints: Slept well ROS--    Objective: Vital Signs: Blood pressure 114/68, pulse 60, temperature 97.9 F (36.6 C), temperature source Oral, resp. rate 18, height 6' (1.829 m), weight 87.091 kg (192 lb), SpO2 94 %. No results found. No results for input(s): WBC, HGB, HCT, PLT in the last 72 hours. No results for input(s): NA, K, CL, GLUCOSE, BUN, CREATININE, CALCIUM in the last 72 hours.  Invalid input(s): CO CBG (last 3)  No results for input(s): GLUCAP in the last 72 hours.  Wt Readings from Last 3 Encounters:  05/06/14 87.091 kg (192 lb)  03/09/14 84.188 kg (185 lb 9.6 oz)  01/23/14 95.255 kg (210 lb)    Physical Exam:  Constitutional: He appears well-developed and well-nourished. He is sleeping. He is easily aroused.  HENT:  Head: Normocephalic.  Neck: supple Cardiovascular: Normal rate and regular rhythm.  Respiratory: Effort normal. No respiratory distress.  GI: Soft. Bowel sounds are normal. He exhibits no distension. There is mild abdominal tenderness, peg site a little red, no drainage or tenderness Musculoskeletal: He exhibits no edema.  Neurological: He is alert today.        Moves all extremities without difficulty.  confabulates Skin: Skin is warm and dry.  Psychiatric:  Confused but more appropriate. Showing improved insight and awareness  Assessment/Plan: 1. 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 Physiatrist is providing close team supervision and 24 hour management of active medical problems listed below. Physiatrist and rehab team continue to assess barriers to discharge/monitor patient progress toward functional and medical goals. Appreciate trauma note, contrast seen in stomach and duodenum although RN states there is some leakage around stoma FIM: FIM -  Bathing Bathing Steps Patient Completed: Chest, Abdomen, Front perineal area, Right Arm, Left Arm, Buttocks, Right upper leg, Left upper leg, Left lower leg (including foot), Right lower leg (including foot) Bathing: 6: More than reasonable amount of time  FIM - Upper Body Dressing/Undressing Upper body dressing/undressing steps patient completed: Thread/unthread right sleeve of pullover shirt/dresss, Thread/unthread left sleeve of pullover shirt/dress, Put head through opening of pull over shirt/dress, Pull shirt over trunk Upper body dressing/undressing: 5: Supervision: Safety issues/verbal cues FIM - Lower Body Dressing/Undressing Lower body dressing/undressing steps patient completed: Thread/unthread right underwear leg, Thread/unthread left underwear leg, Pull underwear up/down, Thread/unthread right pants leg, Thread/unthread left pants leg, Pull pants up/down, Don/Doff right sock, Don/Doff left sock Lower body dressing/undressing: 5: Supervision: Safety issues/verbal cues  FIM - Toileting Toileting steps completed by patient: Adjust clothing prior to toileting, Performs perineal hygiene, Adjust clothing after toileting Toileting Assistive Devices: Grab bar or rail for support Toileting: 7: Independent: No helper, no device  FIM - Diplomatic Services operational officer Devices: Grab bars Toilet Transfers: 6-More than reasonable amt of time, 6-From toilet/BSC  FIM - Banker Devices: Arm rests Bed/Chair Transfer: 7: Bed > Chair or W/C: No assist, 6: Chair or W/C > Bed: No assist  FIM - Locomotion: Wheelchair Locomotion: Wheelchair: 0: Activity did not occur FIM - Locomotion: Ambulation Locomotion: Ambulation Assistive Devices: Other (comment) (none) Ambulation/Gait Assistance: 6: Modified independent (Device/Increase time) Locomotion: Ambulation: 6: Travels 150 ft or more with assistive device/no helper  Comprehension Comprehension  Mode: Auditory Comprehension: 4-Understands basic 75 - 89% of the time/requires cueing 10 - 24% of the time  Expression Expression  Mode: Verbal Expression: 4-Expresses basic 75 - 89% of the time/requires cueing 10 - 24% of the time. Needs helper to occlude trach/needs to repeat words.  Social Interaction Social Interaction Mode: Not assessed Social Interaction: 4-Interacts appropriately 75 - 89% of the time - Needs redirection for appropriate language or to initiate interaction.  Problem Solving Problem Solving: 3-Solves basic 50 - 74% of the time/requires cueing 25 - 49% of the time  Memory Memory: 2-Recognizes or recalls 25 - 49% of the time/requires cueing 51 - 75% of the time  Medical Problem List and Plan: 1. Functional deficits secondary to TBI   2. DVT Prophylaxis/Anticoagulation: Pharmaceutical: Lovenox 3. Pain Management: Will continue oxycodone prn. Will likely need premedication as unable to express needs. Will monitor for now.  4. Mood: Unable to gauge at this time due to cognitive deficits. LCSW to follow along for evaluation and support once more appropriate.  5. Neuropsych: This patient is not capable of making decisions on her own behalf. -[place] of [place] bed, no sitter 6. Skin/Wound Care: pt is mobile 7. Fluids/Electrolytes/Nutrition:megace stoppped .    8. Agitation: confused but less agitated.   -seroquel  -klonopin and librium stopped due to lethargy  -depakote 1000 mg bid -sleeping well  -appreciate psych assessment and input--- 9. ?GERD/odynophagia/dysphagia--D2 diet per SLP- intake improved--no aspiration.   -off tube feeds  -tube placed 2/15--discussed with surgery Monday. They probably will want to wait until at least next week to remove.    LOS (Days) 62 A FACE TO FACE EVALUATION WAS PERFORMED  Shayle Donahoo T 05/10/2014 8:26 AM

## 2014-05-10 NOTE — Progress Notes (Signed)
Physical Therapy Session Note  Patient Details  Name: Justin Pugh MRN: [id number] Date of Birth: 30-Oct-1955  Today's Date: 05/10/2014 PT Individual Time: 1345-1415 PT Individual Time Calculation (min): 30 min   Short Term Goals: Week 1:  PT Short Term Goal 1 (Week 1): Patient will perform functional ambulation < 150 ft in home and community environments with supervision.  PT Short Term Goal 1 - Progress (Week 1): Partly met (in home environment) PT Short Term Goal 2 (Week 1): Patient will negotiate flight of stairs with 1 rail and mod I.  PT Short Term Goal 2 - Progress (Week 1): Met PT Short Term Goal 3 (Week 1): Patient will sustain attention to therapeutic activity x 3 min with min cues.  PT Short Term Goal 3 - Progress (Week 1): Met PT Short Term Goal 4 (Week 1): Patient will demonstrate intellectual awareness in controlled environment with mod cues.  PT Short Term Goal 4 - Progress (Week 1): Not met (Requires total A for intellectual awareness)  Skilled Therapeutic Interventions/Progress Updates:  Pt was seen bedside in the pm. Pt polite and willing to participate. Pt ambulated throughout unit mod I with increased time required. Treatment focused on dual tasking, problem solving and orientation. Pt easily distracted requiring frequent reorientation. Pt returned to room following treatment.   Therapy Documentation Precautions:  Precautions Precautions: None Precaution Comments: Elopement risk, PEG Restrictions Weight Bearing Restrictions: No General:   Pain: No c/o pain.    Locomotion : Ambulation Ambulation/Gait Assistance: 6: Modified independent (Device/Increase time)   See FIM for current functional status  Therapy/Group: Individual Therapy  Dub Amis 05/10/2014, 2:59 PM

## 2014-05-10 NOTE — Progress Notes (Signed)
Occupational Therapy Session Note  Patient Details  Name: Justin Pugh MRN: [id number] Date of Birth: 1955-05-03  Today's Date: 05/10/2014 OT Individual Time:  -   1415-1500  (45 min)      Short Term Goals: Week 1:  OT Short Term Goal 1 (Week 1): Pt will initiate 1 self-care task with max cues OT Short Term Goal 1 - Progress (Week 1): Met OT Short Term Goal 2 (Week 1): Pt will demonstrate sustained attention to functional task for 2 min with max cues OT Short Term Goal 2 - Progress (Week 1): Not met OT Short Term Goal 3 (Week 1): Pt will conisstently be orientated x2 with max cues OT Short Term Goal 3 - Progress (Week 1): Met OT Short Term Goal 4 (Week 1): Pt will remain in room with enclosure bed open for 15 min without cues for redirection  OT Short Term Goal 4 - Progress (Week 1): Progressing toward goal Week 2:  OT Short Term Goal 1 (Week 2): Pt will wash 10/10 body parts at supervision level with max multimodal cues OT Short Term Goal 1 - Progress (Week 2): Met OT Short Term Goal 2 (Week 2): Pt will be oriented x2 with max cues  OT Short Term Goal 2 - Progress (Week 2): Not met OT Short Term Goal 3 (Week 2): Pt will demonstrate sustained attention to functional task for 1 min with max cues  OT Short Term Goal 3 - Progress (Week 2): Other (comment) (pt had met goal however due to agitation over past few days, pt has not met goal) OT Short Term Goal 4 (Week 2): Pt will identify 1 physical or cognitive deficit with max cues OT Short Term Goal 4 - Progress (Week 2): Not met Week 3:  OT Short Term Goal 1 (Week 3): Pt will identify 1 physical or cognitive deficit with max cues OT Short Term Goal 1 - Progress (Week 3): Not met OT Short Term Goal 2 (Week 3): Pt will follow behavioral management plan for out of room schedule with therapy 100% of time with max cues OT Short Term Goal 2 - Progress (Week 3): Partly met OT Short Term Goal 3 (Week 3): Pt will be oriented x2 with max cues  OT  Short Term Goal 3 - Progress (Week 3): Met OT Short Term Goal 4 (Week 3): Pt will initiate 1 self-care task with min cues OT Short Term Goal 4 - Progress (Week 3): Met  Skilled Therapeutic Interventions/Progress Updates:   Pt. Completed PT session and immediately ready for OT.  Explained purpose of treatment  Addressed working memory, attention to tasks, dual task engagement, path finding, problem solving.   Pt. Assisted with room set up for Stroke support Group meeting.  Gave pt instructions for 3 tasks to do and he followed directions with no problems.   Asked pt to find the ADL apartment and assist with getting food for meeting. He needed minimal cues to find room, but once there he remembered the area.   Needed minimal assist with problem solving removing stuck cookies off pan.  He offered some suggestions to keep it from happening again.   Pt. Completed all task with no agitation or hostility noted during session.  He expressed interested in coming to to the stroke support group.  Communicated with nursing and agreed to have him come.    Pt. Obtained coffee with no assistance.  He said he would like to get to the meeting early  so he arrived 5 minutes early.  He introduced himself appropriately and verbalized what happened to him.  At end of meeting to anticipated the clean up and started assisting with that part with no instructional cues.  Ambulated with pt to room and nursing took over care.    Therapy Documentation Precautions:  Precautions Precautions: None Precaution Comments: Elopement risk, PEG Restrictions Weight Bearing Restrictions: No  Pain:  none  See FIM for current functional status  Therapy/Group: Individual Therapy  Lisa Roca 05/10/2014, 4:41 PM

## 2014-05-11 ENCOUNTER — Inpatient Hospital Stay (HOSPITAL_COMMUNITY): Payer: Self-pay | Admitting: Speech Pathology

## 2014-05-11 ENCOUNTER — Inpatient Hospital Stay (HOSPITAL_COMMUNITY): Payer: Medicaid Other

## 2014-05-11 ENCOUNTER — Inpatient Hospital Stay (HOSPITAL_COMMUNITY): Payer: Medicaid Other | Admitting: Physical Therapy

## 2014-05-11 NOTE — Progress Notes (Signed)
[place] PHYSICAL MEDICINE & REHABILITATION     PROGRESS NOTE    Subjective/Complaints: No new issues ROS--    Objective: Vital Signs: Blood pressure 116/68, pulse 70, temperature 98 F (36.7 C), temperature source Oral, resp. rate 18, height 6' (1.829 m), weight 87.091 kg (192 lb), SpO2 100 %. No results found. No results for input(s): WBC, HGB, HCT, PLT in the last 72 hours. No results for input(s): NA, K, CL, GLUCOSE, BUN, CREATININE, CALCIUM in the last 72 hours.  Invalid input(s): CO CBG (last 3)  No results for input(s): GLUCAP in the last 72 hours.  Wt Readings from Last 3 Encounters:  05/06/14 87.091 kg (192 lb)  03/09/14 84.188 kg (185 lb 9.6 oz)  01/23/14 95.255 kg (210 lb)    Physical Exam:  Constitutional: He appears well-developed and well-nourished. He is sleeping. He is easily aroused.  HENT:  Head: Normocephalic.  Neck: supple Cardiovascular: Normal rate and regular rhythm.  Respiratory: Effort normal. No respiratory distress.  GI: Soft. Bowel sounds are normal. He exhibits no distension. There is mild abdominal tenderness, peg site a little red, no drainage or tenderness Musculoskeletal: He exhibits no edema.  Neurological: He is alert today.        Moves all extremities without difficulty.  confabulates Skin: Skin is warm and dry.  Psychiatric:  Confused but more appropriate. Showing improved insight and awareness  Assessment/Plan: 1. 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 Physiatrist is providing close team supervision and 24 hour management of active medical problems listed below. Physiatrist and rehab team continue to assess barriers to discharge/monitor patient progress toward functional and medical goals. Appreciate trauma note, contrast seen in stomach and duodenum although RN states there is some leakage around stoma FIM: FIM -  Bathing Bathing Steps Patient Completed: Chest, Abdomen, Front perineal area, Right Arm, Left Arm, Buttocks, Right upper leg, Left upper leg, Left lower leg (including foot), Right lower leg (including foot) Bathing: 6: More than reasonable amount of time  FIM - Upper Body Dressing/Undressing Upper body dressing/undressing steps patient completed: Thread/unthread right sleeve of pullover shirt/dresss, Thread/unthread left sleeve of pullover shirt/dress, Put head through opening of pull over shirt/dress, Pull shirt over trunk Upper body dressing/undressing: 6: More than reasonable amount of time FIM - Lower Body Dressing/Undressing Lower body dressing/undressing steps patient completed: Thread/unthread right underwear leg, Thread/unthread left underwear leg, Pull underwear up/down, Thread/unthread right pants leg, Thread/unthread left pants leg, Pull pants up/down, Don/Doff right sock, Don/Doff left sock, Fasten/unfasten right shoe, Don/Doff left shoe, Don/Doff right shoe, Fasten/unfasten left shoe Lower body dressing/undressing: 5: Supervision: Safety issues/verbal cues  FIM - Toileting Toileting steps completed by patient: Adjust clothing prior to toileting, Performs perineal hygiene, Adjust clothing after toileting Toileting Assistive Devices: Grab bar or rail for support Toileting: 7: Independent: No helper, no device  FIM - Diplomatic Services operational officerToilet Transfers Toilet Transfers Assistive Devices: Grab bars Toilet Transfers: 6-More than reasonable amt of time, 6-From toilet/BSC  FIM - BankerBed/Chair Transfer Bed/Chair Transfer Assistive Devices: Arm rests Bed/Chair Transfer: 7: Chair or W/C > Bed: No assist, 7: Bed > Chair or W/C: No assist  FIM - Locomotion: Wheelchair Locomotion: Wheelchair: 0: Activity did not occur FIM - Locomotion: Ambulation Locomotion: Ambulation Assistive Devices: Other (comment) (none) Ambulation/Gait Assistance: 6: Modified independent (Device/Increase time) Locomotion: Ambulation:  6: Travels 150 ft or more independently/takes more than reasonable amount of time  Comprehension Comprehension Mode: Auditory Comprehension:  4-Understands basic 75 - 89% of the time/requires cueing 10 - 24% of the time  Expression Expression Mode: Verbal Expression: 4-Expresses basic 75 - 89% of the time/requires cueing 10 - 24% of the time. Needs helper to occlude trach/needs to repeat words.  Social Interaction Social Interaction Mode: Not assessed Social Interaction: 4-Interacts appropriately 75 - 89% of the time - Needs redirection for appropriate language or to initiate interaction.  Problem Solving Problem Solving: 3-Solves basic 50 - 74% of the time/requires cueing 25 - 49% of the time  Memory Memory: 2-Recognizes or recalls 25 - 49% of the time/requires cueing 51 - 75% of the time  Medical Problem List and Plan: 1. Functional deficits secondary to TBI   2. DVT Prophylaxis/Anticoagulation: Pharmaceutical: Lovenox 3. Pain Management: Will continue oxycodone prn. Will likely need premedication as unable to express needs. Will monitor for now.  4. Mood: Unable to gauge at this time due to cognitive deficits. LCSW to follow along for evaluation and support once more appropriate.  5. Neuropsych: This patient is not capable of making decisions on her own behalf. -out of [place] bed, no sitter 6. Skin/Wound Care: pt is mobile 7. Fluids/Electrolytes/Nutrition:megace stoppped .    8. Agitation: confused but less agitated.   -seroquel  -klonopin and librium stopped due to lethargy  -depakote 1000 mg bid -sleeping well  -appreciate psych assessment and input--- 9. ?GERD/odynophagia/dysphagia--D2 diet per SLP- intake improved--no aspiration.   -off tube feeds  -tube placed 2/15--remove this week    LOS (Days) 63 A FACE TO FACE EVALUATION WAS PERFORMED  SWARTZ,ZACHARY T 05/11/2014 7:31 AM

## 2014-05-11 NOTE — Progress Notes (Signed)
Speech Language Pathology Daily Session Note  Patient Details  Name: Justin CohoJeffrey L Pugh MRN: [id number] Date of Birth: [birth year]  Today's Date: 05/11/2014 SLP Individual Time: [id number] SLP Individual Time Calculation (min): 45 min  Short Term Goals: Week 9: SLP Short Term Goal 1 (Week 9): Patient will orient to time, place and situation with Max A multimodal cues for use of external/environmental aids.  SLP Short Term Goal 2 (Week 9): Patient will sustain attention to a functional task for 7-10 minutes with Max A multimodal cues for redirection.   SLP Short Term Goal 3 (Week 9): Patient will masticate trials of Dys. 3 textures with Min A multimodal cues for sustained attention to bolus and clearance of residuals from oral cavity across 3 consecutive sessions.  SLP Short Term Goal 4 (Week 9): Patient will plan, organize and execute problem solving during basic to semi-complex functional tasks with Mod assist verbal cues for use of compensatory strategies.   Skilled Therapeutic Interventions:  Pt was seen for skilled ST targeting cognitive goals.  Upon arrival, pt was reclined in bed, awake, alert, and pleasantly interactive.  Pt initiated sitting up at the edge of the bed to more effectively and appropriately participate in therapeutic tasks.  SLP facilitated the session with a basic card game, to which pt sustained his attention for approximately 3 minutes before requiring mod verbal cues for redirection and correction of verbosity.  Pt also benefited from mod verbal cues for planning, organization, and error awareness during the abovementioned activity due to decreased working memory.  Continue per current plan of care.    FIM:  Comprehension Comprehension Mode: Auditory Comprehension: 4-Understands basic 75 - 89% of the time/requires cueing 10 - 24% of the time Expression Expression Mode: Verbal Expression: 4-Expresses basic 75 - 89% of the time/requires cueing 10 - 24% of the time.  Needs helper to occlude trach/needs to repeat words. Social Interaction Social Interaction: 4-Interacts appropriately 75 - 89% of the time - Needs redirection for appropriate language or to initiate interaction. Problem Solving Problem Solving: 3-Solves basic 50 - 74% of the time/requires cueing 25 - 49% of the time Memory Memory: 2-Recognizes or recalls 25 - 49% of the time/requires cueing 51 - 75% of the time  Pain Pain Assessment Pain Assessment: No/denies pain  Therapy/Group: Individual Therapy  Doc Mandala, Melanee SpryNicole L 05/11/2014, 12:16 PM

## 2014-05-11 NOTE — Progress Notes (Signed)
Occupational Therapy Session Note  Patient Details  Name: Justin Pugh MRN: [id number] Date of Birth: 02/22/1955  Today's Date: 05/11/2014 OT Individual Time: 0700-0745 OT Individual Time Calculation (min): 45 min    Short Term Goals: Week 6:  OT Short Term Goal 1 (Week 6): Patient will sustain attention to functional task (other than ADL) for 5 min with max multimodal cues OT Short Term Goal 2 (Week 6): Patient will retrieve all bathing and dressing needs with mod cues for organization OT Short Term Goal 3 (Week 6): Patient will identify one cognitive deficit with max cues  Skilled Therapeutic Interventions/Progress Updates:    Pt seen for 1:1 OT session with focus on orientation, awareness, organization, attention, sequencing, and activity tolerance. Pt received supine in bed oriented to person, place, and time with use of external aid. Pt demonstrating improved intellectual awareness, stating "they tell me I fracture my brain and I'm not thinking right." Pt gathered all bathing and dressing items with min cues then ambulated to ADL apartment with min cues for path finding. Pt required total A to re-orient to purpose of apartment as pt thinks it's another therapist's home. Pt completed bathing and dressing at supervision-mod I level and initiated cleaning shower after use. Pt ambulated back to room then retrieve hot drink without cues. Engaged in therapeutic conversation with focus on current events and holidays. Pt identified 2 candidates for presidential election as well as date of the first day of spring. Discussed easter holiday coming up in a couple of weeks with pt correctly identifying the reason for celebrating easter. At end of session pt left sitting in room with all needs.  Therapy Documentation Precautions:  Precautions Precautions: None Precaution Comments: Elopement risk, PEG Restrictions Weight Bearing Restrictions: No General:   Vital Signs:   Pain: No report of  pain  See FIM for current functional status  Therapy/Group: Individual Therapy  Daneil Danerkinson, Erich Kochan N 05/11/2014, 7:21 AM
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
## 2014-05-12 ENCOUNTER — Inpatient Hospital Stay (HOSPITAL_COMMUNITY): Payer: Medicaid Other

## 2014-05-12 ENCOUNTER — Inpatient Hospital Stay (HOSPITAL_COMMUNITY): Payer: Medicaid Other | Admitting: Speech Pathology

## 2014-05-12 ENCOUNTER — Inpatient Hospital Stay (HOSPITAL_COMMUNITY): Payer: Medicaid Other | Admitting: Physical Therapy

## 2014-05-12 NOTE — Progress Notes (Signed)
[facility] PHYSICAL MEDICINE & REHABILITATION     PROGRESS NOTE    Subjective/Complaints: Spoke with surgery regarding PEG---they are willing to remove Thursday  ROS--    Objective: Vital Signs: Blood pressure 104/53, pulse 50, temperature 98.4 F (36.9 C), temperature source Oral, resp. rate 17, height 6' (1.829 m), weight 87.091 kg (192 lb), SpO2 100 %. No results found. No results for input(s): WBC, HGB, HCT, PLT in the last 72 hours. No results for input(s): NA, K, CL, GLUCOSE, BUN, CREATININE, CALCIUM in the last 72 hours.  Invalid input(s): CO CBG (last 3)  No results for input(s): GLUCAP in the last 72 hours.  Wt Readings from Last 3 Encounters:  05/06/14 87.091 kg (192 lb)  03/09/14 84.188 kg (185 lb 9.6 oz)  01/23/14 95.255 kg (210 lb)    Physical Exam:  Constitutional: He appears well-developed and well-nourished. He is sleeping. He is easily aroused.  HENT:  Head: Normocephalic.  Neck: supple Cardiovascular: Normal rate and regular rhythm.  Respiratory: Effort normal. No respiratory distress.  GI: Soft. Bowel sounds are normal. He exhibits no distension. There is mild abdominal tenderness, peg site a little red, no drainage or tenderness Musculoskeletal: He exhibits no edema.  Neurological: He is alert today.        Moves all extremities without difficulty.    Skin: Skin is warm and dry.  Psychiatric:  Confused but more appropriate. Showing improved insight and awareness  Assessment/Plan: 1. 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 Physiatrist is providing close team supervision and 24 hour management of active medical problems listed below. Physiatrist and rehab team continue to assess barriers to discharge/monitor patient progress toward functional and medical goals. Appreciate trauma note, contrast seen in stomach and duodenum although RN states  there is some leakage around stoma FIM: FIM - Bathing Bathing Steps Patient Completed: Chest, Abdomen, Front perineal area, Right Arm, Left Arm, Buttocks, Right upper leg, Left upper leg, Left lower leg (including foot), Right lower leg (including foot) Bathing: 6: More than reasonable amount of time  FIM - Upper Body Dressing/Undressing Upper body dressing/undressing steps patient completed: Thread/unthread right sleeve of pullover shirt/dresss, Thread/unthread left sleeve of pullover shirt/dress, Put head through opening of pull over shirt/dress, Pull shirt over trunk Upper body dressing/undressing: 6: More than reasonable amount of time FIM - Lower Body Dressing/Undressing Lower body dressing/undressing steps patient completed: Thread/unthread right underwear leg, Thread/unthread left underwear leg, Pull underwear up/down, Thread/unthread right pants leg, Thread/unthread left pants leg, Pull pants up/down, Don/Doff right sock, Don/Doff left sock, Fasten/unfasten right shoe, Don/Doff left shoe, Don/Doff right shoe, Fasten/unfasten left shoe Lower body dressing/undressing: 5: Supervision: Safety issues/verbal cues  FIM - Toileting Toileting steps completed by patient: Adjust clothing prior to toileting, Performs perineal hygiene, Adjust clothing after toileting Toileting Assistive Devices: Grab bar or rail for support Toileting: 7: Independent: No helper, no device  FIM - Diplomatic Services operational officerToilet Transfers Toilet Transfers Assistive Devices: Grab bars Toilet Transfers: 6-More than reasonable amt of time, 6-From toilet/BSC  FIM - BankerBed/Chair Transfer Bed/Chair Transfer Assistive Devices: Arm rests Bed/Chair Transfer: 6: More than reasonable amt of time  FIM - Locomotion: Wheelchair Locomotion: Wheelchair: 0: Activity did not occur FIM - Locomotion: Ambulation Locomotion: Ambulation Assistive Devices: Other (comment) (none) Ambulation/Gait Assistance: 6: Modified independent (Device/Increase  time) Locomotion: Ambulation: 6: Travels 150 ft or more independently/takes more than reasonable amount of time  Comprehension Comprehension Mode: Auditory Comprehension:  4-Understands basic 75 - 89% of the time/requires cueing 10 - 24% of the time  Expression Expression Mode: Verbal Expression: 4-Expresses basic 75 - 89% of the time/requires cueing 10 - 24% of the time. Needs helper to occlude trach/needs to repeat words.  Social Interaction Social Interaction Mode: Not assessed Social Interaction: 4-Interacts appropriately 75 - 89% of the time - Needs redirection for appropriate language or to initiate interaction.  Problem Solving Problem Solving: 3-Solves basic 50 - 74% of the time/requires cueing 25 - 49% of the time  Memory Memory: 2-Recognizes or recalls 25 - 49% of the time/requires cueing 51 - 75% of the time  Medical Problem List and Plan: 1. Functional deficits secondary to TBI   2. DVT Prophylaxis/Anticoagulation: Pharmaceutical: Lovenox 3. Pain Management: Will continue oxycodone prn. Will likely need premedication as unable to express needs. Will monitor for now.  4. Mood: Unable to gauge at this time due to cognitive deficits. LCSW to follow along for evaluation and support once more appropriate.  5. Neuropsych: This patient is not capable of making decisions on her own behalf. -out of vail bed, no sitter 6. Skin/Wound Care: pt is mobile 7. Fluids/Electrolytes/Nutrition:megace stoppped .    8. Agitation: confused but less agitated.   -seroquel  -klonopin and librium stopped due to lethargy  -depakote 1000 mg bid -sleeping well  -appreciate psych assessment and input--- 9. ?GERD/odynophagia/dysphagia--D2 diet per SLP- intake improved--no aspiration.   -off tube feeds  -tube placed 2/15---SURGERY WILL REMOVE PEG ON THURSDAY    LOS (Days) 64 A FACE TO FACE EVALUATION WAS PERFORMED  Kayzen Kendzierski  T 05/12/2014 8:05 AM

## 2014-05-12 NOTE — Progress Notes (Signed)
Physical Therapy Session Note  Patient Details  Name: Justin Pugh MRN: [id number] Date of Birth: 1955/04/08  Today's Date: 05/12/2014 PT Individual Time: 0800-0845 PT Individual Time Calculation (min): 45 min   Short Term Goals: Week 3:  PT Short Term Goal 1 (Week 3): Patient will be oriented to situation with max multimodal cues.  PT Short Term Goal 2 (Week 3): Patient will sustain attention to therapeutic activity x 15 min with supervision. PT Short Term Goal 3 (Week 3): Patient will demonstrate intellectual awareness in controlled environment with max cues.   Skilled Therapeutic Interventions/Progress Updates:   Session focused on sustained and selective attention, organization, sequencing, problem solving, and short term memory. Patient given task to locate recreational therapist [name] in order to assist her with multiple errands task. Patient required mod cues for problem solving how to locate person and min cues for memory of tasks. Patient completed all given tasks (pushing cart, rearranging items in storage unit, placing items on cart, checking paints, sorting paints by color, returning items to storage unit) with min cues for selective attention x 30 min and mod cues to complete simple sorting task. Patient required min cues for topographical orientation throughout session. Patient left mod I in room.   Therapy Documentation Precautions:  Precautions Precautions: None Precaution Comments: Elopement risk, PEG Restrictions Weight Bearing Restrictions: No Pain: Pain Assessment Pain Assessment: No/denies pain Locomotion : Ambulation Ambulation/Gait Assistance: 6: Modified independent (Device/Increase time)   See FIM for current functional status  Therapy/Group: Individual Therapy  [name], Samul Mcinroy A 05/12/2014, 8:50 AM

## 2014-05-12 NOTE — Progress Notes (Signed)
Speech Language Pathology Daily Session Note  Patient Details  Name: Katrine CohoJeffrey L Henricksen MRN: [id number] Date of Birth: 1955-11-16  Today's Date: 05/12/2014 SLP Individual Time: 1325-1410 SLP Individual Time Calculation (min): 45 min  Short Term Goals: Week 9: SLP Short Term Goal 1 (Week 9): Patient will orient to time, place and situation with Max A multimodal cues for use of external/environmental aids.  SLP Short Term Goal 2 (Week 9): Patient will sustain attention to a functional task for 7-10 minutes with Max A multimodal cues for redirection.   SLP Short Term Goal 3 (Week 9): Patient will masticate trials of Dys. 3 textures with Min A multimodal cues for sustained attention to bolus and clearance of residuals from oral cavity across 3 consecutive sessions.  SLP Short Term Goal 4 (Week 9): Patient will plan, organize and execute problem solving during basic to semi-complex functional tasks with Mod assist verbal cues for use of compensatory strategies.    Skilled Therapeutic Interventions: Skilled treatment session focused on cognitive goals. Upon arrival, patient was awake while sitting upright in chair and agreeable to participate in treatment session.  SLP facilitated session by providing Max A multimodal cues for redirection to functional task for ~60 seconds due to hperverbosity. Patient demonstrated increased confabulation with decreased topic maintenance and turn taking with conversation partner. Patient was oriented to place and situation with Mod A multimodal cues but also continues to associate hospitalization with imprisonment due to "fines."  Of note, patient's "sheet barrier" has been removed and patient has appropriately stayed in his room and asked for assistance from staff. Patient left in room with all needs within reach. Continue with current plan of care.    FIM:  Comprehension Comprehension Mode: Auditory Comprehension: 4-Understands basic 75 - 89% of the time/requires  cueing 10 - 24% of the time Expression Expression Mode: Verbal Expression: 4-Expresses basic 75 - 89% of the time/requires cueing 10 - 24% of the time. Needs helper to occlude trach/needs to repeat words. Social Interaction Social Interaction: 2-Interacts appropriately 25 - 49% of time - Needs frequent redirection. Problem Solving Problem Solving: 3-Solves basic 50 - 74% of the time/requires cueing 25 - 49% of the time Memory Memory: 2-Recognizes or recalls 25 - 49% of the time/requires cueing 51 - 75% of the time  Pain Pain Assessment Pain Assessment: No/denies pain  Therapy/Group: Individual Therapy  Angila Wombles 05/12/2014, 4:07 PM

## 2014-05-12 NOTE — Progress Notes (Addendum)
Occupational Therapy Session Note  Patient Details  Name: Katrine CohoJeffrey L Borel MRN: [id number] Date of Birth: 1955-07-11  Today's Date: 05/12/2014 OT Individual Time: 0700-0745 OT Individual Time Calculation (min): 45 min    Short Term Goals: Week 6:  OT Short Term Goal 1 (Week 6): Patient will sustain attention to functional task (other than ADL) for 5 min with max multimodal cues OT Short Term Goal 2 (Week 6): Patient will retrieve all bathing and dressing needs with mod cues for organization OT Short Term Goal 3 (Week 6): Patient will identify one cognitive deficit with max cues  Skilled Therapeutic Interventions/Progress Updates:    Pt seen for 1:1 OT session with focus on attention, working memory, problem solving, intellectual awareness, and orientation. Pt received supine in bed reporting not sleeping well. Pt donned shoes then retrieved hot drink at supervision level. Pt socially appropriate with other staff member in room, offering to assist her with restocking items in family room. Provided pt 4 items to locate on unit without use of visual aid for reminder. Pt required max multimodal cues for working memory to locate items . Pt returned to room and verbalizing how cold he was last night. Pt required max-total cues for problem solving to retrieve more blankets for bedding. Once pt retrieve blankets, he returned to room to make bed. Utilized schedule with mod cues to identify next therapy session. Pt required total A for recall of focus of OT session, then proceeded to deny memory deficits. Pt also required total A for intellectual awareness this AM, as he consistently reported "I had a bad lifestyle." Pt continues to be hyperverbal and confabulate throughout session, requiring mod-max cues for redirection. Pt left sitting in room with all needs.   Therapy Documentation Precautions:  Precautions Precautions: None Precaution Comments: Elopement risk, PEG Restrictions Weight Bearing  Restrictions: No General:   Vital Signs: Therapy Vitals Temp: 98.4 F (36.9 C) Temp Source: Oral Pulse Rate: (!) 50 Resp: 17 BP: (!) 104/53 mmHg Patient Position (if appropriate): Lying Oxygen Therapy SpO2: 100 % O2 Device: Not Delivered Pain: No report of pain  See FIM for current functional status  Therapy/Group: Individual Therapy  Daneil Danerkinson, Dayton Sherr N 05/12/2014, 7:46 AM
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
## 2014-05-13 ENCOUNTER — Inpatient Hospital Stay (HOSPITAL_COMMUNITY): Payer: Self-pay | Admitting: Speech Pathology

## 2014-05-13 ENCOUNTER — Inpatient Hospital Stay (HOSPITAL_COMMUNITY): Payer: Medicaid Other | Admitting: Physical Therapy

## 2014-05-13 ENCOUNTER — Inpatient Hospital Stay (HOSPITAL_COMMUNITY): Payer: Self-pay

## 2014-05-13 NOTE — Progress Notes (Signed)
[facility] PHYSICAL MEDICINE & REHABILITATION     PROGRESS NOTE    Subjective/Complaints:  no new issues  ROS--    Objective: Vital Signs: Blood pressure 112/58, pulse 50, temperature 97.9 F (36.6 C), temperature source Oral, resp. rate 17, height 6' (1.829 m), weight 87.091 kg (192 lb), SpO2 100 %. No results found. No results for input(s): WBC, HGB, HCT, PLT in the last 72 hours. No results for input(s): NA, K, CL, GLUCOSE, BUN, CREATININE, CALCIUM in the last 72 hours.  Invalid input(s): CO CBG (last 3)  No results for input(s): GLUCAP in the last 72 hours.  Wt Readings from Last 3 Encounters:  05/06/14 87.091 kg (192 lb)  03/09/14 84.188 kg (185 lb 9.6 oz)  01/23/14 95.255 kg (210 lb)    Physical Exam:  Constitutional: He appears well-developed and well-nourished. He is sleeping. He is easily aroused.  HENT:  Head: Normocephalic.  Neck: supple Cardiovascular: Normal rate and regular rhythm.  Respiratory: Effort normal. No respiratory distress.  GI: Soft. Bowel sounds are normal. He exhibits no distension. There is mild abdominal tenderness, peg site a little red, no drainage or tenderness Musculoskeletal: He exhibits no edema.  Neurological: He is alert today.        Moves all extremities without difficulty.    Skin: Skin is warm and dry.  Psychiatric:  Confused but more appropriate. Showing improved insight and awareness  Assessment/Plan: 1. 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 Physiatrist is providing close team supervision and 24 hour management of active medical problems listed below. Physiatrist and rehab team continue to assess barriers to discharge/monitor patient progress toward functional and medical goals. Appreciate trauma note, contrast seen in stomach and duodenum although RN states there is some leakage around stoma FIM: FIM -  Bathing Bathing Steps Patient Completed: Chest, Abdomen, Front perineal area, Right Arm, Left Arm, Buttocks, Right upper leg, Left upper leg, Left lower leg (including foot), Right lower leg (including foot) Bathing: 6: More than reasonable amount of time  FIM - Upper Body Dressing/Undressing Upper body dressing/undressing steps patient completed: Thread/unthread right sleeve of pullover shirt/dresss, Thread/unthread left sleeve of pullover shirt/dress, Put head through opening of pull over shirt/dress, Pull shirt over trunk Upper body dressing/undressing: 6: More than reasonable amount of time FIM - Lower Body Dressing/Undressing Lower body dressing/undressing steps patient completed: Thread/unthread right underwear leg, Thread/unthread left underwear leg, Pull underwear up/down, Thread/unthread right pants leg, Thread/unthread left pants leg, Pull pants up/down, Don/Doff right sock, Don/Doff left sock, Fasten/unfasten right shoe, Don/Doff left shoe, Don/Doff right shoe, Fasten/unfasten left shoe Lower body dressing/undressing: 5: Supervision: Safety issues/verbal cues  FIM - Toileting Toileting steps completed by patient: Adjust clothing prior to toileting, Performs perineal hygiene, Adjust clothing after toileting Toileting Assistive Devices: Grab bar or rail for support Toileting: 7: Independent: No helper, no device  FIM - Diplomatic Services operational officer Devices: Grab bars Toilet Transfers: 6-More than reasonable amt of time, 6-From toilet/BSC  FIM - Banker Devices: Arm rests Bed/Chair Transfer: 7: Independent: No helper  FIM - Locomotion: Wheelchair Locomotion: Wheelchair: 0: Activity did not occur FIM - Locomotion: Ambulation Locomotion: Ambulation Assistive Devices: Other (comment) (none) Ambulation/Gait Assistance: 6: Modified independent (Device/Increase time) Locomotion: Ambulation: 6: Travels 150 ft or more  independently/takes more than reasonable amount of time  Comprehension Comprehension Mode: Auditory Comprehension: 4-Understands basic 75 - 89% of the time/requires cueing  10 - 24% of the time  Expression Expression Mode: Verbal Expression: 4-Expresses basic 75 - 89% of the time/requires cueing 10 - 24% of the time. Needs helper to occlude trach/needs to repeat words.  Social Interaction Social Interaction Mode: Not assessed Social Interaction: 3-Interacts appropriately 50 - 74% of the time - May be physically or verbally inappropriate.  Problem Solving Problem Solving: 3-Solves basic 50 - 74% of the time/requires cueing 25 - 49% of the time  Memory Memory: 2-Recognizes or recalls 25 - 49% of the time/requires cueing 51 - 75% of the time  Medical Problem List and Plan: 1. Functional deficits secondary to TBI   2. DVT Prophylaxis/Anticoagulation: Pharmaceutical: Lovenox 3. Pain Management: Will continue oxycodone prn. Will likely need premedication as unable to express needs. Will monitor for now.  4. Mood: Unable to gauge at this time due to cognitive deficits. LCSW to follow along for evaluation and support once more appropriate.  5. Neuropsych: This patient is not capable of making decisions on her own behalf. -out of vail bed, no sitter 6. Skin/Wound Care: pt is mobile 7. Fluids/Electrolytes/Nutrition:megace stoppped .    8. Agitation: confused but less agitated.   -seroquel  -klonopin and librium stopped due to lethargy  -depakote 1000 mg bid -sleeping well  -appreciate psych assessment and input--- 9. ?GERD/odynophagia/dysphagia--D2 diet per SLP- intake improved--no aspiration.   -off tube feeds  -tube placed 2/15---SURGERY WILL REMOVE PEG ON THURSDAY    LOS (Days) 65 A FACE TO FACE EVALUATION WAS PERFORMED  SWARTZ,ZACHARY T 05/13/2014 8:27 AM

## 2014-05-13 NOTE — Progress Notes (Signed)
Occupational Therapy Session Note  Patient Details  Name: Justin Pugh MRN: [id number] Date of Birth: 1955/06/23  Today's Date: 05/13/2014 OT Individual Time: [id number] OT Individual Time Calculation (min): 45 min    Short Term Goals: Week 6:  OT Short Term Goal 1 (Week 6): Patient will sustain attention to functional task (other than ADL) for 5 min with max multimodal cues OT Short Term Goal 2 (Week 6): Patient will retrieve all bathing and dressing needs with mod cues for organization OT Short Term Goal 3 (Week 6): Patient will identify one cognitive deficit with max cues  Skilled Therapeutic Interventions/Progress Updates:    Pt seen for 1:1 OT session with focus on intellectual awareness, attention, path finding, problem solving, and activity tolerance. Pt received ambulating around room. Pt utilized schedule with mod cues to identify time of therapy scheduled at this time. Pt able to recall 2 activities completed with this therapist yesterday with min cues. Completed grooming task of shaving with pt requiring max cues for sustained attention secondary to pt being hyperverbal. Ambulated off unit to gift shop, however closed. Pt calculated number of minutes until gift shop opened with 100% accuracy. Pt located elevators and returned to unit with min cues for path finding. Pt required more than reasonable amount of time with mod cues to locate 5 mailboxes of staff members with individual names provided verbally only. Pt sorted through papers without assisted and placed on correct desks of staff members. Pt retrieved hot drink then returned to room. Pt oriented to place, person, and situation with mod cues, however continues to perseverate on paying "fines" and his "court date." Pt left in room without sheet barrier.   Therapy Documentation Precautions:  Precautions Precautions: None Precaution Comments: Elopement risk, PEG Restrictions Weight Bearing Restrictions: No General:   Vital  Signs: Therapy Vitals Temp: 97.9 F (36.6 C) Temp Source: Oral Pulse Rate: (!) 50 Resp: 17 BP: (!) 112/58 mmHg Patient Position (if appropriate): Lying Oxygen Therapy SpO2: 100 % O2 Device: Not Delivered Pain: Pain Assessment Pain Assessment: No/denies pain  See FIM for current functional status  Therapy/Group: Individual Therapy  Daneil Danerkinson, Zaydan Papesh N 05/13/2014, 9:25 AM

## 2014-05-13 NOTE — Progress Notes (Signed)
Speech Language Pathology Daily Session Note  Patient Details  Name: Katrine CohoJeffrey L Tanton MRN: [id number] Date of Birth: 03-24-1955  Today's Date: 05/13/2014 SLP Individual Time: 1300-1345 SLP Individual Time Calculation (min): 45 min  Short Term Goals: Week 9: SLP Short Term Goal 1 (Week 9): Patient will orient to time, place and situation with Max A multimodal cues for use of external/environmental aids.  SLP Short Term Goal 2 (Week 9): Patient will sustain attention to a functional task for 7-10 minutes with Max A multimodal cues for redirection.   SLP Short Term Goal 3 (Week 9): Patient will masticate trials of Dys. 3 textures with Min A multimodal cues for sustained attention to bolus and clearance of residuals from oral cavity across 3 consecutive sessions.  SLP Short Term Goal 4 (Week 9): Patient will plan, organize and execute problem solving during basic to semi-complex functional tasks with Mod assist verbal cues for use of compensatory strategies.    Skilled Therapeutic Interventions: Skilled treatment session focused on cognitive and dysphagia goals. Upon arrival, patient was awake while sitting upright in chair and agreeable to participate in treatment session.  SLP facilitated session by providing Max A multimodal cues for redirection to functional task for ~60 seconds due to hperverbosity. Patient demonstrated decreased confabulation and increased topic maintenance and turn taking with conversation partner compared to yesterday's session. Patient was oriented to time, place and situation with Min A multimodal cues and recalled events from previous therapy sessions with Min-Mod A question and verbal cues. Patient consumed trials of thin liquids via straw without overt s/s of aspiration, therefore, recommend patient consume thin liquids via straw. Patient also consumed trials of Dys. 3 textures and demonstrated efficient mastication with adequate oral clearance without overt s/s of  aspiration but required Max A multimodal cues to decrease verbosity to attend to bolus. Recommend patient continue Dys. 2 textures at this time.  Patient left in room with all needs within reach. Continue with current plan of care.   FIM:  Comprehension Comprehension Mode: Auditory Comprehension: 4-Understands basic 75 - 89% of the time/requires cueing 10 - 24% of the time Expression Expression Mode: Verbal Expression: 3-Expresses basic 50 - 74% of the time/requires cueing 25 - 50% of the time. Needs to repeat parts of sentences. Social Interaction Social Interaction: 4-Interacts appropriately 75 - 89% of the time - Needs redirection for appropriate language or to initiate interaction. Problem Solving Problem Solving: 3-Solves basic 50 - 74% of the time/requires cueing 25 - 49% of the time Memory Memory: 2-Recognizes or recalls 25 - 49% of the time/requires cueing 51 - 75% of the time FIM - Eating Eating Activity: 5: Supervision/cues (with trials of Dys. 3 textures )  Pain Pain Assessment Pain Assessment: No/denies pain  Therapy/Group: Individual Therapy  Devyon Keator 05/13/2014, 3:02 PM

## 2014-05-13 NOTE — Progress Notes (Signed)
Physical Therapy Session Note  Patient Details  Name: Justin CohoJeffrey L Pugh MRN: [id number] Date of Birth: 17-Feb-1956  Today's Date: 05/13/2014 PT Individual Time: 1000-1045 PT Individual Time Calculation (min): 45 min   Short Term Goals: Week 3:  PT Short Term Goal 1 (Week 3): Patient will be oriented to situation with max multimodal cues.  PT Short Term Goal 2 (Week 3): Patient will sustain attention to therapeutic activity x 15 min with supervision. PT Short Term Goal 3 (Week 3): Patient will demonstrate intellectual awareness in controlled environment with max cues.   Skilled Therapeutic Interventions/Progress Updates:   Session focused on selective attention, short term memory, problem solving, topographical orientation, and community reintegration. Patient appropriate for off unit activity and reported he was unable to purchase card earlier with OT due to gift shop being closed. Patient recalled that gift shop opened at 9am and utilized clock to correctly determine shop now open. In addition to card, patient instructed to purchase gum from gift shop and cookie from [employer]. Patient recalled path on rehab unit to elevators from OT session and appropriately asked staff employee for directions once on first floor without cuing. Patient recalled 2/3 items to purchase. Patient was able to locate card and gum and estimated total cost for basic money management tasks, including tax. Patient asked cashier if tax was "7 cents on the dollar," she verbalized yes and patient demonstrated awareness of "new president promising to get rid of taxes." At [employer], patient determined that enough money was left over to purchase cookies and counted change with 100% accuracy. Patient required mod cues for selective attention due to patient hyperverbal. Pt returned to rehab unit with min cues for path finding. Patient retrieved hot chocolate and left mod I in room.   Therapy Documentation Precautions:   Precautions Precautions: None Precaution Comments: Elopement risk, PEG Restrictions Weight Bearing Restrictions: No Pain: Pain Assessment Pain Assessment: No/denies pain Locomotion : Ambulation Ambulation/Gait Assistance: 6: Modified independent (Device/Increase time)   See FIM for current functional status  Therapy/Group: Individual Therapy  [name], Irini Leet A 05/13/2014, 1:43 PM

## 2014-05-14 ENCOUNTER — Inpatient Hospital Stay (HOSPITAL_COMMUNITY): Payer: Medicaid Other

## 2014-05-14 ENCOUNTER — Inpatient Hospital Stay (HOSPITAL_COMMUNITY): Payer: Medicaid Other | Admitting: Speech Pathology

## 2014-05-14 ENCOUNTER — Inpatient Hospital Stay (HOSPITAL_COMMUNITY): Payer: Medicaid Other | Admitting: Physical Therapy

## 2014-05-14 LAB — COMPREHENSIVE METABOLIC PANEL
ALBUMIN: 3.5 g/dL (ref 3.5–5.2)
ALK PHOS: 50 U/L (ref 39–117)
ALT: 6 U/L (ref 0–53)
AST: 13 U/L (ref 0–37)
Anion gap: 9 (ref 5–15)
BUN: 6 mg/dL (ref 6–23)
CALCIUM: 9.8 mg/dL (ref 8.4–10.5)
CHLORIDE: 100 mmol/L (ref 96–112)
CO2: 29 mmol/L (ref 19–32)
Creatinine, Ser: 0.79 mg/dL (ref 0.50–1.35)
GFR calc Af Amer: >90 mL/min (ref >90)
GFR calc non Af Amer: >90 mL/min (ref >90)
Glucose, Bld: 96 mg/dL (ref 70–99)
POTASSIUM: 4.3 mmol/L (ref 3.5–5.1)
Sodium: 138 mmol/L (ref 135–145)
Total Bilirubin: 0.7 mg/dL (ref 0.3–1.2)
Total Protein: 6.7 g/dL (ref 6.0–8.3)

## 2014-05-14 LAB — CBC
HEMATOCRIT: 41.8 % (ref 39.0–52.0)
Hemoglobin: 13.6 g/dL (ref 13.0–17.0)
MCH: 30.9 pg (ref 26.0–34.0)
MCHC: 32.5 g/dL (ref 30.0–36.0)
MCV: 95 fL (ref 78.0–100.0)
PLATELETS: 171 10*3/uL (ref 150–400)
RBC: 4.4 MIL/uL (ref 4.22–5.81)
RDW: 14.6 % (ref 11.5–15.5)
WBC: 6.9 10*3/uL (ref 4.0–10.5)

## 2014-05-14 LAB — VALPROIC ACID LEVEL: Valproic Acid Lvl: 73.2 ug/mL (ref 50.0–100.0)

## 2014-05-14 NOTE — Progress Notes (Signed)
Social Work Patient ID: Justin Pugh, male   DOB: 03-01-1955, 59 y.o.   MRN: [id number]  Able to meet with pt's mother on Tuesday afternoon following team conference.  Mother aware that peg tube set for removal today and that I am pursuing ALF placement for him.  She is well aware that facility could be several hours away.  She is very grateful for all that CIR has done with pt and agreeable with d/c planning taking place.  Will keep team posted on bed search progress.  Hortencia Martire, LCSW

## 2014-05-14 NOTE — Progress Notes (Signed)
Physical Therapy Weekly Progress Note  Patient Details  Name: Justin Pugh MRN: [id number] Date of Birth: 08-Sep-1955  Beginning of progress report period: May 08, 2014 End of progress report period: May 14, 2014  Patient has met 1 of 3 short term goals.  Patient has made progress this reporting period due to improved sustained attention, problem solving, working memory of physical tasks, and orientation. Patient has been pleasant, cooperative, and fully participatory in therapy with overall socially appropriate behavior. Patient continues to require max cues for orientation and intellectual awareness and mod cues for attention, problem solving, and recall. Patient demonstrates behaviors consistent with Rancho Level 5. Anticipate removal of PEG tomorrow.     Patient continues to demonstrate the following deficits: decreased safety awareness, decreased attention, decreased intellectual awareness, absent emergent/anticipatory awareness, decreased memory, decreased problem solving, decreased sequencing, decreased organization, decreased activity tolerance and therefore will continue to benefit from skilled PT intervention to enhance overall performance with activity tolerance, balance, postural control, ability to compensate for deficits, attention, awareness and knowledge of precautions.  Patient progressing toward long term goals.  Continue plan of care.   PT Short Term Goals Week 3:  PT Short Term Goal 1 (Week 3): Patient will be oriented to situation with max multimodal cues.  PT Short Term Goal 1 - Progress (Week 3): Met PT Short Term Goal 2 (Week 3): Patient will sustain attention to therapeutic activity x 15 min with supervision. PT Short Term Goal 2 - Progress (Week 3): Progressing toward goal PT Short Term Goal 3 (Week 3): Patient will demonstrate intellectual awareness in controlled environment with max cues.  PT Short Term Goal 3 - Progress (Week 3): Progressing toward goal Week 4:   PT Short Term Goal 1 (Week 4): = LTGs    Laretta Alstrom 05/14/2014, 4:00 PM
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
## 2014-05-14 NOTE — Progress Notes (Signed)
Occupational Therapy Session Note  Patient Details  Name: Justin CohoJeffrey L Benzel MRN: [id number] Date of Birth: 12/06/1955  Today's Date: 05/14/2014 OT Individual Time: 0700-0745 OT Individual Time Calculation (min): 45 min    Short Term Goals: Week 6:  OT Short Term Goal 1 (Week 6): Patient will sustain attention to functional task (other than ADL) for 5 min with max multimodal cues OT Short Term Goal 2 (Week 6): Patient will retrieve all bathing and dressing needs with mod cues for organization OT Short Term Goal 3 (Week 6): Patient will identify one cognitive deficit with max cues  Skilled Therapeutic Interventions/Progress Updates:    Pt seen for 1:1 OT session with focus on selective and alternating attention, memory, problem solving, orientation, and awareness. Pt received supine in bed. Pt with improved intellectual awareness this AM and less confabulation, not mentioning jail, court date, or stealing money. Engaged in letter game with pt locating letters A-H in alphabetical order with min cues. Pt demonstrated improved problem solving and working memory during activity, requiring mod cues to identify food that began with letter and min cues for alternating attention to task. Pt ambulated to therapy gym with min cues for path finding and assisted with cleaning up "messy" gym. Pt organized 3 stacks of number plates with increased time, placing in numerical order 1-9. Pt returned to room and left with all needs in reach. Pt required max cues for maintaining NPO status due to scheduled peg tube removal this AM. Pt required total A for recall of reason for NPO with no carryover noted.   Therapy Documentation Precautions:  Precautions Precautions: None Precaution Comments: Elopement risk, PEG Restrictions Weight Bearing Restrictions: No General:   Vital Signs: Therapy Vitals Temp: 97.6 F (36.4 C) Temp Source: Oral Pulse Rate: 68 Resp: 17 BP: (!) 108/57 mmHg Patient Position (if  appropriate): Lying Oxygen Therapy SpO2: 99 % O2 Device: Not Delivered Pain: No report of pain  See FIM for current functional status  Therapy/Group: Individual Therapy  Daneil Danerkinson, Jacey Eckerson N 05/14/2014, 7:54 AM

## 2014-05-14 NOTE — Progress Notes (Addendum)
Speech Language Pathology Weekly Progress Note  Patient Details  Name: Justin Pugh MRN: [id number] Date of Birth: 12/08/1955  Beginning of progress report period: May 07, 2014 End of progress report period: May 13, 2013   Short Term Goals: Week 9: SLP Short Term Goal 1 (Week 9): Patient will orient to time, place and situation with Max A multimodal cues for use of external/environmental aids. (MET) SLP Short Term Goal 2 (Week 9): Patient will sustain attention to a functional task for 7-10 minutes with Max A multimodal cues for redirection. (MET) SLP Short Term Goal 3 (Week 9): Patient will masticate trials of Dys. 3 textures with Min A multimodal cues for sustained attention to bolus and clearance of residuals from oral cavity across 3 consecutive sessions. (NOT MET) SLP Short Term Goal 4 (Week 9): Patient will plan, organize and execute problem solving during basic to semi-complex functional tasks with Mod assist verbal cues for use of compensatory strategies.(MET)    New Short Term Goals: Week 10: SLP Short Term Goal 1 (Week 10): Patient will orient to time, place and situation with Mod A multimodal cues for use of external/environmental aids.  SLP Short Term Goal 2 (Week 10): Patient will sustain attention to a functional task for 15 minutes with Mod A multimodal cues for redirection. SLP Short Term Goal 3 (Week 10): Patient will masticate trials of Dys. 3 textures with Min A multimodal cues for sustained attention to bolus and clearance of residuals from oral cavity across 3 consecutive sessions.  SLP Short Term Goal 4 (Week 10): Patient will plan, organize and execute problem solving during basic to semi-complex functional tasks with Min assist verbal cues for use of compensatory strategies.  Weekly Progress Updates: Patient has made functional gains and has met 3 of 4 STG's this reporting period due to increased orientation, sustained attention and functional problem  solving. Currently, patient is consuming Dys. 2 textures with thin liquids without overt s/s of aspiration and utilizes small bites and a slow rate of self-feeding with Mod I.  Patient is also consuming trials of Dys. 3 textures but continues to require cues for safety with mastication due to hyperverbosity.  Patient also continues to demonstrate increased ability to participate in treatment sessions in a safe and cooperative manner and demonstrates behaviors consistent with a Rancho Level V. Overall, patient requires Max A multimodal cues for orientation and intellectual awareness of cognitive deficits and Mod A multimodal cues for sustained attention, functional problem solving and recall of functional information. Patient would benefit from continued skilled SLP intervention to maximize cognitive and swallowing function in order to maximize his functional independence and reduce caregiver burden.   Intensity: Minumum of 1-2 x/day, 30 to 90 minutes Frequency: 3 to 5 out of 7 days Duration/Length of Stay: TBD due to D/C to ALF with a locked unit  Treatment/Interventions: Dysphagia/aspiration precaution training;Environmental controls;Functional tasks;Patient/family education;Therapeutic Activities;Cueing hierarchy;Cognitive remediation/compensation;Internal/external aids    ,  05/14/2014, 3:28 PM         

## 2014-05-14 NOTE — Progress Notes (Signed)
[facility] PHYSICAL MEDICINE & REHABILITATION     PROGRESS NOTE    Subjective/Complaints: Ready for feeding tube to come out. ROS--    Objective: Vital Signs: Blood pressure 108/57, pulse 68, temperature 97.6 F (36.4 C), temperature source Oral, resp. rate 17, height 6' (1.829 m), weight 87.091 kg (192 lb), SpO2 99 %. No results found. No results for input(s): WBC, HGB, HCT, PLT in the last 72 hours. No results for input(s): NA, K, CL, GLUCOSE, BUN, CREATININE, CALCIUM in the last 72 hours.  Invalid input(s): CO CBG (last 3)  No results for input(s): GLUCAP in the last 72 hours.  Wt Readings from Last 3 Encounters:  05/06/14 87.091 kg (192 lb)  03/09/14 84.188 kg (185 lb 9.6 oz)  01/23/14 95.255 kg (210 lb)    Physical Exam:  Constitutional: He appears well-developed and well-nourished. He is sleeping. He is easily aroused.  HENT:  Head: Normocephalic.  Neck: supple Cardiovascular: Normal rate and regular rhythm.  Respiratory: Effort normal. No respiratory distress.  GI: Soft. Bowel sounds are normal. He exhibits no distension. There is mild abdominal tenderness, peg site a little red, no drainage or tenderness Musculoskeletal: He exhibits no edema.  Neurological: He is alert today.        Moves all extremities without difficulty.    Skin: Skin is warm and dry.  Psychiatric:  Confused but more appropriate. Showing improved insight and awareness  Assessment/Plan: 1. 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 Physiatrist is providing close team supervision and 24 hour management of active medical problems listed below. Physiatrist and rehab team continue to assess barriers to discharge/monitor patient progress toward functional and medical goals. Appreciate trauma note, contrast seen in stomach and duodenum although RN states there is some leakage around  stoma FIM: FIM - Bathing Bathing Steps Patient Completed: Chest, Abdomen, Front perineal area, Right Arm, Left Arm, Buttocks, Right upper leg, Left upper leg, Left lower leg (including foot), Right lower leg (including foot) Bathing: 6: More than reasonable amount of time  FIM - Upper Body Dressing/Undressing Upper body dressing/undressing steps patient completed: Thread/unthread right sleeve of pullover shirt/dresss, Thread/unthread left sleeve of pullover shirt/dress, Put head through opening of pull over shirt/dress, Pull shirt over trunk Upper body dressing/undressing: 6: More than reasonable amount of time FIM - Lower Body Dressing/Undressing Lower body dressing/undressing steps patient completed: Thread/unthread right underwear leg, Thread/unthread left underwear leg, Pull underwear up/down, Thread/unthread right pants leg, Thread/unthread left pants leg, Pull pants up/down, Don/Doff right sock, Don/Doff left sock, Fasten/unfasten right shoe, Don/Doff left shoe, Don/Doff right shoe, Fasten/unfasten left shoe Lower body dressing/undressing: 5: Supervision: Safety issues/verbal cues  FIM - Toileting Toileting steps completed by patient: Adjust clothing prior to toileting, Performs perineal hygiene, Adjust clothing after toileting Toileting Assistive Devices: Grab bar or rail for support Toileting: 7: Independent: No helper, no device  FIM - Diplomatic Services operational officerToilet Transfers Toilet Transfers Assistive Devices: Grab bars Toilet Transfers: 6-More than reasonable amt of time, 6-From toilet/BSC  FIM - BankerBed/Chair Transfer Bed/Chair Transfer Assistive Devices: Arm rests Bed/Chair Transfer: 7: Independent: No helper  FIM - Locomotion: Wheelchair Locomotion: Wheelchair: 0: Activity did not occur FIM - Locomotion: Ambulation Locomotion: Ambulation Assistive Devices: Other (comment) (none) Ambulation/Gait Assistance: 6: Modified independent (Device/Increase time) Locomotion: Ambulation: 6: Travels 150 ft or  more independently/takes more than reasonable amount of time  Comprehension Comprehension Mode: Auditory Comprehension: 4-Understands basic 75 - 89% of the  time/requires cueing 10 - 24% of the time  Expression Expression Mode: Verbal Expression: 3-Expresses basic 50 - 74% of the time/requires cueing 25 - 50% of the time. Needs to repeat parts of sentences.  Social Interaction Social Interaction Mode: Not assessed Social Interaction: 4-Interacts appropriately 75 - 89% of the time - Needs redirection for appropriate language or to initiate interaction.  Problem Solving Problem Solving: 3-Solves basic 50 - 74% of the time/requires cueing 25 - 49% of the time  Memory Memory: 2-Recognizes or recalls 25 - 49% of the time/requires cueing 51 - 75% of the time  Medical Problem List and Plan: 1. Functional deficits secondary to TBI   2. DVT Prophylaxis/Anticoagulation: Pharmaceutical: Lovenox 3. Pain Management: Will continue oxycodone prn. Will likely need premedication as unable to express needs. Will monitor for now.  4. Mood: Unable to gauge at this time due to cognitive deficits. LCSW to follow along for evaluation and support once more appropriate.  5. Neuropsych: This patient is not capable of making decisions on her own behalf. -out of vail bed, no sitter 6. Skin/Wound Care: pt is mobile 7. Fluids/Electrolytes/Nutrition:megace stoppped .    8. Agitation: confused but less agitated.   -seroquel  -klonopin and librium stopped due to lethargy  -depakote 1000 mg bid -sleeping well  -appreciate psych assessment and input--- 9. ?GERD/odynophagia/dysphagia--D2 diet per SLP- intake improved--no aspiration.   -off tube feeds  -surgery will remove PEG today  LOS (Days) 66 A FACE TO FACE EVALUATION WAS PERFORMED  SWARTZ,ZACHARY T 05/14/2014 8:30 AM

## 2014-05-14 NOTE — Progress Notes (Signed)
Speech Language Pathology Daily Session Note  Patient Details  Name: Justin CohoJeffrey L Dacquisto MRN: [id number] Date of Birth: 1956/02/07  Today's Date: 05/14/2014 SLP Individual Time: 1100-1200 SLP Individual Time Calculation (min): 60 min  Short Term Goals: Week 9: SLP Short Term Goal 1 (Week 9): Patient will orient to time, place and situation with Max A multimodal cues for use of external/environmental aids.  SLP Short Term Goal 2 (Week 9): Patient will sustain attention to a functional task for 7-10 minutes with Max A multimodal cues for redirection.   SLP Short Term Goal 3 (Week 9): Patient will masticate trials of Dys. 3 textures with Min A multimodal cues for sustained attention to bolus and clearance of residuals from oral cavity across 3 consecutive sessions.  SLP Short Term Goal 4 (Week 9): Patient will plan, organize and execute problem solving during basic to semi-complex functional tasks with Mod assist verbal cues for use of compensatory strategies.  Skilled Therapeutic Interventions: Skilled treatment session focused on cognitive goals. Upon arrival, patient was awake and sitting upright in chair, agreeable to participate in therapy session and demonstrating frustration and perseveration on NPO status due to upcoming procedure, mentioning "getting out of here" and cursing, with limited to no orientation to place or situation. Student facilitated session with Mod A verbal cues for redirection and divided attention to functional conversation about basic biographical information and sorting task with Mod A verbal cues for ~40 minutes. Patient with no awareness of intellectual deficits and continues to exhibit hyperverbose behavior with confabulation and decreased topic maintenance and turn-taking, but maintained appropriate social interaction; suspect patient's frustration with NPO status. Patient was noted to have impulsivity regarding bolus size and speaking while consuming snack of Dys. 1  textures, however, to avoid possible patient escalation no dysphagia goals were addressed at this time. Patient demonstrated no overt s/s of aspiration. Patient requested hot chocolate and ambulated to dayroom with supervision, then returned in room with all needs within reach. Continue with current plan of care.   FIM:  Comprehension Comprehension Mode: Auditory Comprehension: 4-Understands basic 75 - 89% of the time/requires cueing 10 - 24% of the time Expression Expression Mode: Verbal Expression: 3-Expresses basic 50 - 74% of the time/requires cueing 25 - 50% of the time. Needs to repeat parts of sentences. Social Interaction Social Interaction: 4-Interacts appropriately 75 - 89% of the time - Needs redirection for appropriate language or to initiate interaction. Problem Solving Problem Solving: 3-Solves basic 50 - 74% of the time/requires cueing 25 - 49% of the time Memory Memory: 2-Recognizes or recalls 25 - 49% of the time/requires cueing 51 - 75% of the time FIM - Eating Eating Activity: 5: Supervision/cues;5: Needs verbal cues/supervision  Pain Pain Assessment Pain Assessment: No/denies pain  Therapy/Group: Individual Therapy  Tacey RuizKatherine Bayan Kushnir 05/14/2014, 12:45 PM

## 2014-05-14 NOTE — Progress Notes (Signed)
Physical Therapy Session Note  Patient Details  Name: Justin Pugh MRN: [id number] Date of Birth: [birth year]  Today's Date: 05/14/2014 PT Individual Time: 0905-1000 PT Individual Time Calculation (min): 55 min   Short Term Goals: Week 3:  PT Short Term Goal 1 (Week 3): Patient will be oriented to situation with max multimodal cues.  PT Short Term Goal 2 (Week 3): Patient will sustain attention to therapeutic activity x 15 min with supervision. PT Short Term Goal 3 (Week 3): Patient will demonstrate intellectual awareness in controlled environment with max cues.   Skilled Therapeutic Interventions/Progress Updates:   Session focused on selective and alternating attention, problem solving, memory, awareness, and orientation. Patient voicing frustration due to NPO status in preparation for removal of PEG and change in schedule due to "emergency." Engaged in therapeutic conversation with focus on orientation and purpose of NPO, mod cues for redirection and total A for orientation to place and situation. Patient redirected to physical task of organizing chairs and cleaning tables in dayroom x 10 min with supervision. Recreational therapist joined session and patient engaged in making trivet with grout and mosaic tiles from kit. Patient required initial max A multimodal cues to attend to directions but then was able to complete task successfully with mod A. Patient problem solved through equipment needed to complete task with min A and assisted in retrieving items/cleaning up. Patient left mod I in room.   Therapy Documentation Precautions:  Precautions Precautions: None Precaution Comments: Elopement risk, PEG Restrictions Weight Bearing Restrictions: No Pain: Pain Assessment Pain Assessment: No/denies pain Locomotion : Ambulation Ambulation/Gait Assistance: 7: Independent   See FIM for current functional status  Therapy/Group: Individual Therapy  Laretta Alstrom 05/14/2014, 10:35  AM

## 2014-05-14 NOTE — Progress Notes (Signed)
Recreational Therapy Session Note  Patient Details  Name: Justin Pugh MRN: [id number] Date of Birth: [birth year] Today's Date: 05/14/2014  Pain:  No c/o Skilled Therapeutic Interventions/Progress Updates: Session focused on selective attention and problem solving for tabletop craft.  Pt ambulated throughout unit to gather supplies to complete a mosaic tile trivet with supervision.  Once in the dayroom, max multimodal cues required for attending to task for task completion.  Pt required min cues for problem solving through equipment needed and how to adapt available tools to meet his needs.  Therapy/Group: Co-Treatment   Ardyn Forge 05/14/2014, 4:24 PM
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
## 2014-05-15 ENCOUNTER — Inpatient Hospital Stay (HOSPITAL_COMMUNITY): Payer: Medicaid Other | Admitting: Physical Therapy

## 2014-05-15 ENCOUNTER — Inpatient Hospital Stay (HOSPITAL_COMMUNITY): Payer: Medicaid Other | Admitting: Speech Pathology

## 2014-05-15 ENCOUNTER — Inpatient Hospital Stay (HOSPITAL_COMMUNITY): Payer: Self-pay

## 2014-05-15 NOTE — Progress Notes (Signed)
Occupational Therapy Session Note  Patient Details  Name: Justin CohoJeffrey L Sieling MRN: [id number] Date of Birth: 03-25-1955  Today's Date: 05/15/2014 OT Individual Time: 0700-0800 OT Individual Time Calculation (min): 60 min    Short Term Goals: Focus on LTGs   Skilled Therapeutic Interventions/Progress Updates:    Pt seen for 1:1 OT session with focus on attention, memory, problem solving, dynamic balance, activity tolerance, and awareness. Pt received supine in bed. Pt completed shaving task at sink demonstrating selective and alternating attention with min cues. Pt retrieved laundry items with increased time as they were dispersed around room then ambulated to laundry room with no cues for path finding. Pt required min cues for operating machine. Ambulated to day room to provide visual aid of types of chairs then asked pt to check empty room for extra chairs to bring back. Pt required min cues for socially appropriate behavior to knock on door before entering to ensure no one was in there. Pt required mod cues for recall of specific type of chair. Pt cleaned 2 chairs and carried them 25+ feet from rooms to day room at Mod I level and no cues for path finding. Pt retrieve breakfast tray with increased time then returned to room. Engaged in therapeutic conversation during breakfast with pt demonstrating intellectual awareness with min cues. Note pt being less hyperverbal on this date, required mod cues for topic maintenance. Pt with no s/s of aspiration during breakfast. Pt left sitting in room with all needs in reach.   Therapy Documentation Precautions:  Precautions Precautions: None Precaution Comments: Elopement risk, PEG Restrictions Weight Bearing Restrictions: No General:   Vital Signs:   Pain:   ADL:   Exercises:   Other Treatments:    See FIM for current functional status  Therapy/Group: Individual Therapy  Daneil Danerkinson, Cailen Mihalik N 05/15/2014, 9:18 AM

## 2014-05-15 NOTE — Progress Notes (Signed)
Physical Therapy Session Note  Patient Details  Name: Justin Pugh MRN: [id number] Date of Birth: [birth year]  Today's Date: 05/15/2014 PT Individual Time: 0800-0900 PT Individual Time Calculation (min): 60 min   Short Term Goals: Week 4:  PT Short Term Goal 1 (Week 4): = LTGs   Skilled Therapeutic Interventions/Progress Updates:   Session focused on sustained/selective attention, working memory, problem solving, topographical orientation, and community reintegration. Patient appropriate for off the unit activity; performed functional ambulation on/off unit and elevators, up/down flight of stairs, outside on uneven surfaces/inclines/declines, etc, with supervision for safety due to history of elopement. Patient required min cues for path finding to locate exit by [employer] and gift shop. Patient able to recall 2/3 objects to identify outside (flag, fountain, flowers) with mod Pugh multimodal cues and total Pugh to locate objects. Patient recalled [facility] Patient required mod multimodal assist to navigate through hospital and locate main entrance. Patient recalled task to locate fountains without assist. Patient able to return to rehab unit with min cues and recalled task to change laundry to dryer. Patient required no cues for operating machine. Patient perseverating on chairs throughout session due to task completed with OT prior to PT session and required mod-max cues for topic maintenance with therapeutic conversation. Of note, after walking on opposite side of wall from therapist while outside, patient appeared to not recognize this clinician as he said "Good morning!" and asked, "Where did my helper go?" Patient left mod I in room.    Therapy Documentation Precautions:  Precautions Precautions: None Precaution Comments: Elopement risk, PEG Restrictions Weight Bearing Restrictions: No Pain: Pain Assessment Pain Assessment: No/denies  pain Locomotion : Ambulation Ambulation/Gait Assistance: 7: Independent   See FIM for current functional status  Therapy/Group: Individual Therapy  Justin Pugh, Justin Pugh 05/15/2014, 11:12 AM

## 2014-05-15 NOTE — Progress Notes (Signed)
Speech Language Pathology Daily Session Note  Patient Details  Name: Justin Pugh MRN: [id number] Date of Birth: 1955-04-03  Today's Date: 05/15/2014 SLP Individual Time: 1000-1100 SLP Individual Time Calculation (min): 60 min  Short Term Goals: Week 7: SLP Short Term Goal 1 (Week 7): Patient will orient to time, place and situation with Max A multimodal cues.  SLP Short Term Goal 2 (Week 7): Patient will demonstrate sustained attention to functional task for 2 minutes with Max A multimodal cues.  SLP Short Term Goal 3 (Week 7): Patient will demonstrate efficient mastication with trials of Dys. 2  textures with minimal overt s/s of aspiration with Min A multimodal cues. SLP Short Term Goal 4 (Week 7): Patient will consume trials of thin liquids with minimal overt s/s of aspiration with supervision multimodal cues for use of swallowing strategies.   Skilled Therapeutic Interventions: Skilled treatment focused on addressing cognitive goals. Patient was very pleasant and cooperative throughout session. Clinician presented patient with a task of building a birdhouse with a model kit that included all necessary parts and instructions. Patient required moderate to maximal cues to direct patient in reading directions, but patient was not able to follow the directions and instead he attempted to figure out how to build the house on his own. Initially, patient required maximal verbal cues to direct him to start/initiate actually building the birdhouse. Patient did demonstrate some instances of good problem solving and reasoning with this task to try to correct problems, however these were problems that he had created by not following the directions. (he did used to frame houses, and do tile work). Patient participated in conversation regarding the reason he is here in the hospital. Patient perseverative on "owing a million dollars", but did state that he "bumped his head...there was some bleeding" when  clinician provided semantic cues to focus his attention away from perseveration on owing a million dollars. Patient consumed trial of graham crackers, with moderate verbal cues to direct and redirect his attention to eating. He did not exhibit any overt s/s aspiration with this consistency.   FIM:  Comprehension Comprehension Mode: Auditory Comprehension: 4-Understands basic 75 - 89% of the time/requires cueing 10 - 24% of the time Expression Expression Mode: Verbal Expression: 3-Expresses basic 50 - 74% of the time/requires cueing 25 - 50% of the time. Needs to repeat parts of sentences. Social Interaction Social Interaction: 4-Interacts appropriately 75 - 89% of the time - Needs redirection for appropriate language or to initiate interaction. Problem Solving Problem Solving: 3-Solves basic 50 - 74% of the time/requires cueing 25 - 49% of the time Memory Memory: 2-Recognizes or recalls 25 - 49% of the time/requires cueing 51 - 75% of the time FIM - Eating Eating Activity: 5: Needs verbal cues/supervision  Pain Pain Assessment Pain Assessment: No/denies pain Pain Score: 0-No pain  Therapy/Group: Individual Therapy  Dannial Monarch 05/15/2014, 12:19 PM  Dannial Monarch, [place], [place]

## 2014-05-15 NOTE — Progress Notes (Signed)
PHYSICAL MEDICINE & REHABILITATION     PROGRESS NOTE    Subjective/Complaints: No issues. Tube out without any problems. ROS--    Objective: Vital Signs: Blood pressure 115/60, pulse 71, temperature 97.8 F (36.6 C), temperature source Oral, resp. rate 16, height 6' (1.829 m), weight 87.091 kg (192 lb), SpO2 97 %. No results found.  Recent Labs  05/14/14 0800  WBC 6.9  HGB 13.6  HCT 41.8  PLT 171    Recent Labs  05/14/14 0800  NA 138  K 4.3  CL 100  GLUCOSE 96  BUN 6  CREATININE 0.79  CALCIUM 9.8   CBG (last 3)  No results for input(s): GLUCAP in the last 72 hours.  Wt Readings from Last 3 Encounters:  05/06/14 87.091 kg (192 lb)  03/09/14 84.188 kg (185 lb 9.6 oz)  01/23/14 95.255 kg (210 lb)    Physical Exam:  Constitutional: He appears well-developed and well-nourished. He is sleeping. He is easily aroused.  HENT:  Head: Normocephalic.  Neck: supple Cardiovascular: Normal rate and regular rhythm.  Respiratory: Effort normal. No respiratory distress.  GI: Soft. Bowel sounds are normal. He exhibits no distension. There is mild abdominal tenderness. Minimal drainage at former tube site. Musculoskeletal: He exhibits no edema.  Neurological: He is alert today.        Moves all extremities without difficulty.    Skin: Skin is warm and dry.  Psychiatric:  Confused but more appropriate. Showing improved insight and awareness  Assessment/Plan: 1. 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 Physiatrist is providing close team supervision and 24 hour management of active medical problems listed below. Physiatrist and rehab team continue to assess barriers to discharge/monitor patient progress toward functional and medical goals. Appreciate trauma note, contrast seen in stomach and duodenum although RN states there is some leakage around  stoma FIM: FIM - Bathing Bathing Steps Patient Completed: Chest, Abdomen, Front perineal area, Right Arm, Left Arm, Buttocks, Right upper leg, Left upper leg, Left lower leg (including foot), Right lower leg (including foot) Bathing: 6: More than reasonable amount of time  FIM - Upper Body Dressing/Undressing Upper body dressing/undressing steps patient completed: Thread/unthread right sleeve of pullover shirt/dresss, Thread/unthread left sleeve of pullover shirt/dress, Put head through opening of pull over shirt/dress, Pull shirt over trunk Upper body dressing/undressing: 6: More than reasonable amount of time FIM - Lower Body Dressing/Undressing Lower body dressing/undressing steps patient completed: Thread/unthread right underwear leg, Thread/unthread left underwear leg, Pull underwear up/down, Thread/unthread right pants leg, Thread/unthread left pants leg, Pull pants up/down, Don/Doff right sock, Don/Doff left sock, Fasten/unfasten right shoe, Don/Doff left shoe, Don/Doff right shoe, Fasten/unfasten left shoe Lower body dressing/undressing: 5: Supervision: Safety issues/verbal cues  FIM - Toileting Toileting steps completed by patient: Adjust clothing prior to toileting, Performs perineal hygiene, Adjust clothing after toileting Toileting Assistive Devices: Grab bar or rail for support Toileting: 7: Independent: No helper, no device  FIM - Diplomatic Services operational officer Devices: Therapist, music Transfers: 7-Independent: No helper, 7-From toilet/BSC  FIM - Banker Devices: Arm rests Bed/Chair Transfer: 7: Independent: No helper  FIM - Locomotion: Wheelchair Locomotion: Wheelchair: 0: Activity did not occur FIM - Locomotion: Ambulation Locomotion: Ambulation Assistive Devices: Other (comment) (none) Ambulation/Gait Assistance: 7: Independent Locomotion: Ambulation: 7: Travels 150 ft or more  independently  Comprehension Comprehension Mode: Auditory Comprehension: 4-Understands basic 75 - 89% of  the time/requires cueing 10 - 24% of the time  Expression Expression Mode: Verbal Expression: 3-Expresses basic 50 - 74% of the time/requires cueing 25 - 50% of the time. Needs to repeat parts of sentences.  Social Interaction Social Interaction Mode: Not assessed Social Interaction: 4-Interacts appropriately 75 - 89% of the time - Needs redirection for appropriate language or to initiate interaction.  Problem Solving Problem Solving: 3-Solves basic 50 - 74% of the time/requires cueing 25 - 49% of the time  Memory Memory: 2-Recognizes or recalls 25 - 49% of the time/requires cueing 51 - 75% of the time  Medical Problem List and Plan: 1. Functional deficits secondary to TBI   2. DVT Prophylaxis/Anticoagulation: Pharmaceutical: Lovenox 3. Pain Management: Will continue oxycodone prn. Will likely need premedication as unable to express needs. Will monitor for now.  4. Mood: Unable to gauge at this time due to cognitive deficits. LCSW to follow along for evaluation and support once more appropriate.  5. Neuropsych: This patient is not capable of making decisions on her own behalf. -out of vail bed, no sitter 6. Skin/Wound Care: pt is mobile 7. Fluids/Electrolytes/Nutrition:megace stoppped .    8. Agitation: confused but less agitated.   -seroquel  -klonopin and librium stopped due to lethargy  -depakote 1000 mg bid -sleeping well  -appreciate psych assessment and input--- 9. ?GERD/odynophagia/dysphagia-D2 thins  -PEG out  LOS (Days) 67 A FACE TO FACE EVALUATION WAS PERFORMED  Talene Glastetter T 05/15/2014 9:19 AM

## 2014-05-16 ENCOUNTER — Inpatient Hospital Stay (HOSPITAL_COMMUNITY): Payer: Medicaid Other | Admitting: Physical Therapy

## 2014-05-16 ENCOUNTER — Inpatient Hospital Stay (HOSPITAL_COMMUNITY): Payer: Self-pay

## 2014-05-16 NOTE — Progress Notes (Signed)
Patient ID: Justin CohoJeffrey L Pugh, male   DOB: [birth year], 57 y.o.   MRN: [id number]    [place] PHYSICAL MEDICINE & REHABILITATION     PROGRESS NOTE   05/16/14.  Subjective/Complaints:  59 y/o admit for CIR with functional deficits secondary to TBI. No new issues.  BP and pulse trending down; on low dose inderal-will d/c  ROS--    History reviewed. No pertinent past medical history.  Patient Vitals for the past 24 hrs:  BP Temp Temp src Pulse Resp SpO2  05/16/14 0529 (!) 95/56 mmHg 97.9 F (36.6 C) Oral (!) 55 20 98 %  05/15/14 2247 - - - (!) 58 - -  05/15/14 1646 97/72 mmHg 98.4 F (36.9 C) Oral (!) 56 18 100 %     Intake/Output Summary (Last 24 hours) at 05/16/14 0910 Last data filed at 05/16/14 0800  Gross per 24 hour  Intake    960 ml  Output      0 ml  Net    960 ml     Objective: Vital Signs: Blood pressure 95/56, pulse 55, temperature 97.9 F (36.6 C), temperature source Oral, resp. rate 20, height 6' (1.829 m), weight 192 lb (87.091 kg), SpO2 98 %. No results found.  Recent Labs  05/14/14 0800  WBC 6.9  HGB 13.6  HCT 41.8  PLT 171    Recent Labs  05/14/14 0800  NA 138  K 4.3  CL 100  GLUCOSE 96  BUN 6  CREATININE 0.79  CALCIUM 9.8   CBG (last 3)  No results for input(s): GLUCAP in the last 72 hours.  Wt Readings from Last 3 Encounters:  05/06/14 192 lb (87.091 kg)  03/09/14 185 lb 9.6 oz (84.188 kg)  01/23/14 210 lb (95.255 kg)    Physical Exam:  Constitutional: He appears well-developed and well-nourished. He is sleeping. He is easily aroused.  HENT:  Head: Normocephalic.  Neck: supple Cardiovascular: Normal rate and regular rhythm.  Respiratory: Effort normal. No respiratory distress.  GI: Soft. Bowel sounds are normal. He exhibits no distension. There is mild abdominal tenderness. Minimal drainage at former tube site. Musculoskeletal: He exhibits no edema.  Neurological: He is alert today.        Moves all extremities without  difficulty.    Skin: Skin is warm and dry.  Psychiatric:  Remains confused but more appropriate.   Assessment/Plan: 1. Functional deficits secondary to TBI   2. DVT Prophylaxis/Anticoagulation: Pharmaceutical: Lovenox 3. Pain Management: Will continue oxycodone prn. Will likely need premedication as unable to express needs. Will monitor for now.  4. Mood: Unable to gauge at this time due to cognitive deficits. LCSW to follow along for evaluation and support once more appropriate.  5. Neuropsych: This patient is not capable of making decisions on her own behalf. -out of vail bed, no sitter 6. Skin/Wound Care: pt is mobile 7. Fluids/Electrolytes/Nutrition:megace stoppped .    8. Agitation: confused but less agitated.   -seroquel  -klonopin and librium stopped due to lethargy  -depakote 1000 mg bid -sleeping well  -appreciate psych assessment and input--- 9. ?GERD/odynophagia/dysphagia-D2 thins  -PEG out  LOS (Days) 68 A FACE TO FACE EVALUATION WAS PERFORMED  Rogelia BogaKWIATKOWSKI,Jaccob Czaplicki FRANK 05/16/2014 9:09 AM

## 2014-05-16 NOTE — Progress Notes (Signed)
Physical Therapy Session Note  Patient Details  Name: Justin Pugh MRN: [id number] Date of Birth: 10/19/1955  Today's Date: 05/16/2014 PT Individual Time: 1400-1430 PT Individual Time Calculation (min): 30 min   Short Term Goals: Week 4:  PT Short Term Goal 1 (Week 4): = LTGs   Skilled Therapeutic Interventions/Progress Updates:    Neuromuscular Reeducation: PT session focused on cognitive remediation including: problem solving, attention to task, and following multi-step cues. Pt received in room sitting in recliner chair, happy to see this therapist and willing to help PT work on thank-you card writing task. Pt req mod cues for sustained attention to task in coming up with items needed to write thank you notes (cards with envelopes, pens, addresses, stamps). Pt demonstrates short term focused attention in writing items in an ordered list on notepad. Pt req mod-max cues to follow multi-step commands of: explaining to PT where to go (place of business) to obtain each item and then to write this place of business on the notepad. PT then instructs pt to help her figure out roughly how much this should cost. Pt is able to give relatively close approximation for cost of items ($2/card, $1 per pen, $0.50 per stamp) and then req mod-max cues for attention to task of figuring out line item cost of each item and then adding up the total. Pt is able to do simply math (multiplying) in his head and uses the notepad to assist in adding 3 line items together, correctly.   Pt continues to have poor attention skills, but behavior has greatly improved in participation to task. Continue with lifestyle related cognitive rehabilitation and per PT POC.   Therapy Documentation Precautions:  Precautions Precautions: None Precaution Comments: Elopement risk, PEG Restrictions Weight Bearing Restrictions: No Pain: Pain Assessment Pain Assessment: No/denies pain  See FIM for current functional  status  Therapy/Group: Individual Therapy  Jeanett Antonopoulos M 05/16/2014, 3:37 PM

## 2014-05-16 NOTE — Progress Notes (Signed)
Occupational Therapy Session Note  Patient Details  Name: Justin Pugh MRN: [id number] Date of Birth: [birth year]  Today's Date: 05/16/2014 OT Individual Time: 1430-1505 OT Individual Time Calculation (min): 35 min    Short Term Goals: Week 6:  OT Short Term Goal 1 (Week 6): Patient will sustain attention to functional task (other than ADL) for 5 min with max multimodal cues OT Short Term Goal 2 (Week 6): Patient will retrieve all bathing and dressing needs with mod cues for organization OT Short Term Goal 3 (Week 6): Patient will identify one cognitive deficit with max cues  Skilled Therapeutic Interventions/Progress Updates:    Pt seen for 1:1 OT session with focus on problem solving, organization, sequencing, and attention. Pt received following PT session and agreeable to make milkshake. Pt required min cues to retrieve all items. Pt recalled location of refrigerators and kitchen without cues. Pt required mod-max cues for organization of task with increased time. Pt with improved problem solving to use whisk and spoon as he was unable to locate all parts to blender for task. Pt required min cues for selective attention throughout activity as another pt and therapist was in kitchen as well. Pt initiated cleaning up following activity, rinsing all items and placing in dishwasher. Pt returned to room and left with all needs in reach.   Therapy Documentation Precautions:  Precautions Precautions: None Precaution Comments: Elopement risk, PEG Restrictions Weight Bearing Restrictions: No General:   Vital Signs:  Pain:   ADL:   Exercises:   Other Treatments:    See FIM for current functional status  Therapy/Group: Individual Therapy  Daneil Danerkinson, Jacorey Donaway N 05/16/2014, 3:07 PM

## 2014-05-17 ENCOUNTER — Inpatient Hospital Stay (HOSPITAL_COMMUNITY): Payer: Medicaid Other | Admitting: Occupational Therapy

## 2014-05-17 ENCOUNTER — Inpatient Hospital Stay (HOSPITAL_COMMUNITY): Payer: Self-pay

## 2014-05-17 NOTE — Progress Notes (Signed)
Occupational Therapy Session Note  Patient Details  Name: Justin Pugh MRN: [id number] Date of Birth: 1955-04-25  Today's Date: 05/17/2014 OT Individual Time:  -   1345-1415 (30 min)   Short Term Goals: Week 1:  OT Short Term Goal 1 (Week 1): Pt will initiate 1 self-care task with max cues OT Short Term Goal 1 - Progress (Week 1): Met OT Short Term Goal 2 (Week 1): Pt will demonstrate sustained attention to functional task for 2 min with max cues OT Short Term Goal 2 - Progress (Week 1): Not met OT Short Term Goal 3 (Week 1): Pt will conisstently be orientated x2 with max cues OT Short Term Goal 3 - Progress (Week 1): Met OT Short Term Goal 4 (Week 1): Pt will remain in room with enclosure bed open for 15 min without cues for redirection  OT Short Term Goal 4 - Progress (Week 1): Progressing toward goal Week 2:  OT Short Term Goal 1 (Week 2): Pt will wash 10/10 body parts at supervision level with max multimodal cues OT Short Term Goal 1 - Progress (Week 2): Met OT Short Term Goal 2 (Week 2): Pt will be oriented x2 with max cues  OT Short Term Goal 2 - Progress (Week 2): Not met OT Short Term Goal 3 (Week 2): Pt will demonstrate sustained attention to functional task for 1 min with max cues  OT Short Term Goal 3 - Progress (Week 2): Other (comment) (pt had met goal however due to agitation over past few days, pt has not met goal) OT Short Term Goal 4 (Week 2): Pt will identify 1 physical or cognitive deficit with max cues OT Short Term Goal 4 - Progress (Week 2): Not met Week 3:  OT Short Term Goal 1 (Week 3): Pt will identify 1 physical or cognitive deficit with max cues OT Short Term Goal 1 - Progress (Week 3): Not met OT Short Term Goal 2 (Week 3): Pt will follow behavioral management plan for out of room schedule with therapy 100% of time with max cues OT Short Term Goal 2 - Progress (Week 3): Partly met OT Short Term Goal 3 (Week 3): Pt will be oriented x2 with max cues  OT  Short Term Goal 3 - Progress (Week 3): Met OT Short Term Goal 4 (Week 3): Pt will initiate 1 self-care task with min cues OT Short Term Goal 4 - Progress (Week 3): Met Week 4:  OT Short Term Goal 1 (Week 4): Pt will participate in 45 min therapy session without verbal or phyical agitation  OT Short Term Goal 1 - Progress (Week 4): Met OT Short Term Goal 2 (Week 4): Pt will identify 1 physical or cognitive deficit with max cues OT Short Term Goal 2 - Progress (Week 4): Met OT Short Term Goal 3 (Week 4): Pt will be oriented x3 with max cues OT Short Term Goal 3 - Progress (Week 4): Met  Skilled Therapeutic Interventions/Progress Updates:       Pt seen for 1:1 OT session with focus on attention, memory, problem solving, dynamic balance, activity tolerance, and awareness. Engaged in functional task for sustained sttention and working memory.  Pt. Verbalized communications he needed to make with his case worker.  Did 5x sit to stand x 8 in 15 seconds. Pt. Maintained good eye contact throughout entire session during conversation.   Left pt with P.T.  for next session.    Therapy Documentation Precautions:  Precautions Precautions:  None Precaution Comments: Elopement risk, PEG Restrictions Weight Bearing Restrictions: No Pain: Pain Assessment Pain Assessment: No/denies pain  See FIM for current functional status  Therapy/Group: Individual Therapy  Lisa Roca 05/17/2014, 2:17 PM

## 2014-05-17 NOTE — Progress Notes (Signed)
Physical Therapy Session Note  Patient Details  Name: Justin Pugh MRN: [id number] Date of Birth: November 01, 1955  Today's Date: 05/17/2014 PT Individual Time: 1415-1445 PT Individual Time Calculation (min): 30 min   Short Term Goals: Week 4:  PT Short Term Goal 1 (Week 4): = LTGs   Skilled Therapeutic Interventions/Progress Updates:    Pt received handed off from OT, agreeable to participate in therapy. Pt engaged in multiple cognitive tasks of sorting items by color and then sorting by number, finding empty spots in gym to place furniture for it to be out of the way of walking, and stacking chairs into stacks of certain amounts in day room. Pt completed all tasks with min cueing due to internal distractions and verbal perseverations, but pt pleasant and cooperative throughout session. Session ended in pt's room, where pt was left w/ all needs within reach.    Therapy Documentation Precautions:  Precautions Precautions: None Precaution Comments: Elopement risk, PEG Restrictions Weight Bearing Restrictions: No Pain:  No/denies pain  See FIM for current functional status  Therapy/Group: Individual Therapy  Justin Pugh, Justin Pugh  Justin Pugh, PT, DPT 05/17/2014, 7:42 AM
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
## 2014-05-18 ENCOUNTER — Inpatient Hospital Stay (HOSPITAL_COMMUNITY): Payer: Medicaid Other

## 2014-05-18 ENCOUNTER — Inpatient Hospital Stay (HOSPITAL_COMMUNITY): Payer: Medicaid Other | Admitting: Physical Therapy

## 2014-05-18 ENCOUNTER — Inpatient Hospital Stay (HOSPITAL_COMMUNITY): Payer: Medicaid Other | Admitting: Speech Pathology

## 2014-05-18 NOTE — Progress Notes (Signed)
[facility] PHYSICAL MEDICINE & REHABILITATION     PROGRESS NOTE    Subjective/Complaints: No new complaints ROS--    Objective: Vital Signs: Blood pressure 104/66, pulse 76, temperature 97.9 F (36.6 C), temperature source Oral, resp. rate 18, height 6' (1.829 m), weight 87.091 kg (192 lb), SpO2 100 %. No results found. No results for input(s): WBC, HGB, HCT, PLT in the last 72 hours. No results for input(s): NA, K, CL, GLUCOSE, BUN, CREATININE, CALCIUM in the last 72 hours.  Invalid input(s): CO CBG (last 3)  No results for input(s): GLUCAP in the last 72 hours.  Wt Readings from Last 3 Encounters:  05/06/14 87.091 kg (192 lb)  03/09/14 84.188 kg (185 lb 9.6 oz)  01/23/14 95.255 kg (210 lb)    Physical Exam:  Constitutional: He appears well-developed and well-nourished. He is sleeping. He is easily aroused.  HENT:  Head: Normocephalic.  Neck: supple Cardiovascular: Normal rate and regular rhythm.  Respiratory: Effort normal. No respiratory distress.  GI: Soft. Bowel sounds are normal. He exhibits no distension. There is mild abdominal tenderness. Minimal drainage at former tube site. Musculoskeletal: He exhibits no edema.  Neurological: He is alert today.        Moves all extremities without difficulty.    Skin: Skin is warm and dry.  Psychiatric:  Confused but more appropriate. Showing improved insight and awareness  Assessment/Plan: 1. 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 Physiatrist is providing close team supervision and 24 hour management of active medical problems listed below. Physiatrist and rehab team continue to assess barriers to discharge/monitor patient progress toward functional and medical goals. Appreciate trauma note, contrast seen in stomach and duodenum although RN states there is some leakage around stoma FIM: FIM - Bathing Bathing  Steps Patient Completed: Chest, Abdomen, Front perineal area, Right Arm, Left Arm, Buttocks, Right upper leg, Left upper leg, Left lower leg (including foot), Right lower leg (including foot) Bathing: 6: More than reasonable amount of time  FIM - Upper Body Dressing/Undressing Upper body dressing/undressing steps patient completed: Thread/unthread right sleeve of pullover shirt/dresss, Thread/unthread left sleeve of pullover shirt/dress, Put head through opening of pull over shirt/dress, Pull shirt over trunk Upper body dressing/undressing: 6: More than reasonable amount of time FIM - Lower Body Dressing/Undressing Lower body dressing/undressing steps patient completed: Thread/unthread right underwear leg, Thread/unthread left underwear leg, Pull underwear up/down, Thread/unthread right pants leg, Thread/unthread left pants leg, Pull pants up/down, Don/Doff right sock, Don/Doff left sock, Fasten/unfasten right shoe, Don/Doff left shoe, Don/Doff right shoe, Fasten/unfasten left shoe Lower body dressing/undressing: 5: Supervision: Safety issues/verbal cues  FIM - Toileting Toileting steps completed by patient: Adjust clothing prior to toileting, Performs perineal hygiene, Adjust clothing after toileting Toileting Assistive Devices: Grab bar or rail for support Toileting: 7: Independent: No helper, no device  FIM - Diplomatic Services operational officer Devices: Therapist, music Transfers: 7-Independent: No helper, 7-From toilet/BSC  FIM - Banker Devices: Arm rests Bed/Chair Transfer: 7: Independent: No helper  FIM - Locomotion: Wheelchair Locomotion: Wheelchair: 0: Activity did not occur FIM - Locomotion: Ambulation Locomotion: Ambulation Assistive Devices: Other (comment) (none) Ambulation/Gait Assistance: 7: Independent Locomotion: Ambulation: 7: Travels 150 ft or more independently  Comprehension Comprehension Mode:  Auditory Comprehension: 4-Understands basic 75 - 89% of the time/requires cueing 10 - 24% of the time  Expression Expression Mode: Verbal Expression: 3-Expresses basic 50 - 74%  of the time/requires cueing 25 - 50% of the time. Needs to repeat parts of sentences.  Social Interaction Social Interaction Mode: Not assessed Social Interaction: 4-Interacts appropriately 75 - 89% of the time - Needs redirection for appropriate language or to initiate interaction.  Problem Solving Problem Solving: 3-Solves basic 50 - 74% of the time/requires cueing 25 - 49% of the time  Memory Memory: 2-Recognizes or recalls 25 - 49% of the time/requires cueing 51 - 75% of the time  Medical Problem List and Plan: 1. Functional deficits secondary to TBI   2. DVT Prophylaxis/Anticoagulation: Pharmaceutical: Lovenox 3. Pain Management: Will continue oxycodone prn. Will likely need premedication as unable to express needs. Will monitor for now.  4. Mood: Unable to gauge at this time due to cognitive deficits. LCSW to follow along for evaluation and support once more appropriate.  5. Neuropsych: This patient is not capable of making decisions on her own behalf. 6. Skin/Wound Care: pt is mobile 7. Fluids/Electrolytes/Nutrition:megace stoppped .    8. Agitation: confused but less agitated.   -seroquel  -klonopin and librium stopped due to lethargy  -depakote 1000 mg bid -sleeping well  -appreciate psych assessment and input--- 9. ?GERD/odynophagia/dysphagia-D2 thins  -PEG out  LOS (Days) 70 A FACE TO FACE EVALUATION WAS PERFORMED  Justin Pugh T 05/18/2014 9:00 AM

## 2014-05-18 NOTE — Progress Notes (Signed)
Speech Language Pathology Daily Session Note  Patient Details  Name: Justin Pugh MRN: [id number] Date of Birth: 1955-07-26  Today's Date: 05/18/2014 SLP Individual Time: 1400-1525 SLP Individual Time Calculation (min): 85 min  Short Term Goals: SLP Short Term Goal 1 (Week 10): Patient will orient to time, place and situation with Mod A multimodal cues for use of external/environmental aids.  SLP Short Term Goal 2 (Week 10): Patient will sustain attention to a functional task for 15 minutes with Mod A multimodal cues for redirection. SLP Short Term Goal 3 (Week 10): Patient will masticate trials of Dys. 3 textures with Min A multimodal cues for sustained attention to bolus and clearance of residuals from oral cavity across 3 consecutive sessions.  SLP Short Term Goal 4 (Week 10): Patient will plan, organize and execute problem solving during basic to semi-complex functional tasks with Min assist verbal cues for use of compensatory strategies.  Skilled Therapeutic Interventions: Skilled treatment session focused on cognitive goals. Patient agreeable to participate in therapy and ambulated with supervision to dayroom. Student facilitated session with functional task of creating sign advertising bake sale. Patient was initially hyperverbose with decreased conversational turn-taking, however, once patient engaged in sign-making task, patient was Mod I for maintaining selective attention to task in a mildly distracting environment for ~60 minutes and for functional problem-solving involving measuring, cutting, and posting sign. Patient oriented to date with Min A question cues after patient independently recalled holiday occurring this past weekend. Patient returned to room with all needs within reach. Continue with current plan of care.   FIM:  Comprehension Comprehension Mode: Auditory Comprehension: 4-Understands basic 75 - 89% of the time/requires cueing 10 - 24% of the  time Expression Expression Mode: Verbal Social Interaction Social Interaction: 4-Interacts appropriately 75 - 89% of the time - Needs redirection for appropriate language or to initiate interaction. Problem Solving Problem Solving: 5-Solves basic problems: With no assist Memory Memory: 2-Recognizes or recalls 25 - 49% of the time/requires cueing 51 - 75% of the time  Pain Pain Assessment Pain Assessment: No/denies pain  Therapy/Group: Individual Therapy  Justin Pugh 05/18/2014, 3:51 PM
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
## 2014-05-18 NOTE — Progress Notes (Signed)
Resting without complain of during the night. Changed old peg site dressing at HS. Small area of hypergranulation tissue observed. Justin Pugh, Justin Pugh
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
## 2014-05-18 NOTE — Progress Notes (Signed)
Physical Therapy Session Note  Patient Details  Name: Justin Pugh MRN: [id number] Date of Birth: 1955/12/16  Today's Date: 05/18/2014 PT Individual Time: 0905-1000 PT Individual Time Calculation (min): 55 min   Short Term Goals: Week 4:  PT Short Term Goal 1 (Week 4): = LTGs   Skilled Therapeutic Interventions/Progress Updates:   Session focused on sustained/selective attention, orientation, awareness, and problem solving with functional task of preparing for bake sale on rehab unit. Patient required min cues for orientation to date as he initially stated today was Easter. Patient attended to tasks of sorting cookies and tagging prices x 20 min with mod cues and was able to appropriately estimate prices for bake sale based on quantity/quality of product. Patient assisted in gathering supplies for bake sale with mod cues for topographical orientation. Patient engaged this therapist in questions regarding background and education and stated that maybe if he worked with therapy, it would help him "get out of here faster." Patient required total A for awareness of currently working with therapist and awareness of deficits.   Therapy Documentation Precautions:  Precautions Precautions: None Precaution Comments: Elopement risk, PEG Restrictions Weight Bearing Restrictions: No Pain: Pain Assessment Pain Assessment: No/denies pain  See FIM for current functional status  Therapy/Group: Individual Therapy  [name], Mirl Hillery A 05/18/2014, 10:54 AM

## 2014-05-19 ENCOUNTER — Inpatient Hospital Stay (HOSPITAL_COMMUNITY): Payer: Medicaid Other | Admitting: Speech Pathology

## 2014-05-19 ENCOUNTER — Inpatient Hospital Stay (HOSPITAL_COMMUNITY): Payer: Medicaid Other | Admitting: *Deleted

## 2014-05-19 ENCOUNTER — Inpatient Hospital Stay (HOSPITAL_COMMUNITY): Payer: Medicaid Other

## 2014-05-19 NOTE — Progress Notes (Signed)
Occupational Therapy Session Note  Patient Details  Name: Katrine CohoJeffrey L Perkins MRN: [id number] Date of Birth: 1955-10-24  Today's Date: 05/19/2014 OT Individual Time: 0700-0800 OT Individual Time Calculation (min): 60 min    Short Term Goals: Week 7:  OT Short Term Goal 1 (Week 7): STGs=LTGs  Skilled Therapeutic Interventions/Progress Updates:    Pt seen for 1:1 OT session with focus on awareness, sequencing, organization, memory, and attention. Pt received supine in bed. Engaged in therapeutic conversation with focus on awareness and orientation. Pt required max cues for intellectual awareness and continues to report "not believing I hurt my head." Pt perseverative on "never getting to eat" and "people not working here," requiring max-total cues for topic maintenance. Pt utilized schedule to identify therapies and times with mod cues. Pt required total A for reasoning as pt stating "my therapists never come" when this therapist provided name badge to reinforce role of therapist. Pt agreed to complete bathing this AM, requiring min cues for organization to retrieve all bathing and dressing needs before ambulating to ADL apartment. Pt completed bathing and dressing at mod I-supervision level overall. Returned to room and provided pt with multi step task to locate scale, weigh self then inform RN. Pt located scale without difficulty, then required min cues to problem solve to obtain weight. Pt unable to recall name of RN, after checking board before leaving room, therefore returned to room to obtain name again. Upon return, pt declining locating RN stating "she can find me later." Pt continued to decline locating her despite max cues of encouragement, however agreeable to write weight down with 100% accuracy for recall. NT entered and provided breakfast, therefore pt ate breakfast with supervision cues for smaller bite size. Pt left sitting in recliner with all needs.   Therapy Documentation Precautions:   Precautions Precautions: None Precaution Comments: Elopement risk, PEG Restrictions Weight Bearing Restrictions: No General:   Vital Signs: Therapy Vitals Temp: 98 F (36.7 C) Temp Source: Oral Pulse Rate: 68 Resp: 18 BP: 121/68 mmHg Patient Position (if appropriate): Lying Oxygen Therapy SpO2: 100 % O2 Device: Not Delivered Pain: Pain Assessment Pain Assessment: No/denies pain  See FIM for current functional status  Therapy/Group: Individual Therapy  Daneil Danerkinson, Nashla Althoff N 05/19/2014, 9:27 AM

## 2014-05-19 NOTE — Progress Notes (Signed)
[facility]IL[facility]     PROGRESS NOTE    Subjective/Complaints:  no complaints ROS--    Objective: Vital Signs: Blood pressure 121/68, pulse 68, temperature 98 F (36.7 C), temperature source Oral, resp. rate 18, height 6' (1.829 m), weight 89.495 kg (197 lb 4.8 oz), SpO2 100 %. No results found. No results for input(s): WBC, HGB, HCT, PLT in the last 72 hours. No results for input(s): NA, K, CL, GLUCOSE, BUN, CREATININE, CALCIUM in the last 72 hours.  Invalid input(s): CO CBG (last 3)  No results for input(s): GLUCAP in the last 72 hours.  Wt Readings from Last 3 Encounters:  05/19/14 89.495 kg (197 lb 4.8 oz)  03/09/14 84.188 kg (185 lb 9.6 oz)  01/23/14 95.255 kg (210 lb)    Physical Exam:  Constitutional: He appears well-developed and well-nourished. He is sleeping. He is easily aroused.  HENT:  Head: Normocephalic.  Neck: supple Cardiovascular: Normal rate and regular rhythm.  Respiratory: Effort normal. No respiratory distress.  GI: Soft. Bowel sounds are normal. He exhibits no distension. There is mild abdominal tenderness. Minimal drainage at former tube site. Musculoskeletal: He exhibits no edema.  Neurological: He is alert today.        Moves all extremities without difficulty.    Skin: Skin is warm and dry.  Psychiatric:  Confused but more appropriate. Showing improved insight and awareness  Assessment/Plan: 1. 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 Physiatrist is providing close team supervision and 24 hour management of active medical problems listed below. Physiatrist and rehab team continue to assess barriers to discharge/monitor patient progress toward functional and medical goals. Appreciate trauma note, contrast seen in stomach and duodenum although RN states there is some leakage around stoma FIM: FIM -  Bathing Bathing Steps Patient Completed: Chest, Abdomen, Front perineal area, Right Arm, Left Arm, Buttocks, Right upper leg, Left upper leg, Left lower leg (including foot), Right lower leg (including foot) Bathing: 6: More than reasonable amount of time  FIM - Upper Body Dressing/Undressing Upper body dressing/undressing steps patient completed: Thread/unthread right sleeve of pullover shirt/dresss, Thread/unthread left sleeve of pullover shirt/dress, Put head through opening of pull over shirt/dress, Pull shirt over trunk Upper body dressing/undressing: 6: More than reasonable amount of time FIM - Lower Body Dressing/Undressing Lower body dressing/undressing steps patient completed: Thread/unthread right underwear leg, Thread/unthread left underwear leg, Pull underwear up/down, Thread/unthread right pants leg, Thread/unthread left pants leg, Pull pants up/down, Don/Doff right sock, Don/Doff left sock, Fasten/unfasten right shoe, Don/Doff left shoe, Don/Doff right shoe, Fasten/unfasten left shoe Lower body dressing/undressing: 5: Supervision: Safety issues/verbal cues  FIM - Toileting Toileting steps completed by patient: Adjust clothing prior to toileting, Performs perineal hygiene, Adjust clothing after toileting Toileting Assistive Devices: Grab bar or rail for support Toileting: 7: Independent: No helper, no device  FIM - Diplomatic Services operational officerToilet Transfers Toilet Transfers Assistive Devices: Therapist, musicGrab bars Toilet Transfers: 7-Independent: No helper, 7-From toilet/BSC  FIM - BankerBed/Chair Transfer Bed/Chair Transfer Assistive Devices: Arm rests Bed/Chair Transfer: 7: Independent: No helper  FIM - Locomotion: Wheelchair Locomotion: Wheelchair: 0: Activity did not occur FIM - Locomotion: Ambulation Locomotion: Ambulation Assistive Devices: Other (comment) (none) Ambulation/Gait Assistance: 7: Independent Locomotion: Ambulation: 7: Travels 150 ft or more independently  Comprehension Comprehension Mode:  Auditory Comprehension: 5-Follows basic conversation/direction: With no assist  Expression Expression Mode: Verbal Expression: 4-Expresses basic 75 - 89% of the time/requires cueing 10 -  24% of the time. Needs helper to occlude trach/needs to repeat words.  Social Interaction Social Interaction Mode: Not assessed Social Interaction: 4-Interacts appropriately 75 - 89% of the time - Needs redirection for appropriate language or to initiate interaction.  Problem Solving Problem Solving: 5-Solves basic 90% of the time/requires cueing < 10% of the time  Memory Memory: 3-Recognizes or recalls 50 - 74% of the time/requires cueing 25 - 49% of the time  Medical Problem List and Plan: 1. Functional deficits secondary to TBI   2. DVT Prophylaxis/Anticoagulation: Pharmaceutical: Lovenox 3. Pain Management: Will continue oxycodone prn. Will likely need premedication as unable to express needs. Will monitor for now.  4. Mood: Unable to gauge at this time due to cognitive deficits. LCSW to follow along for evaluation and support once more appropriate.  5. Neuropsych: This patient is not capable of making decisions on her own behalf. 6. Skin/Wound Care: pt is mobile 7. Fluids/Electrolytes/Nutrition:megace stoppped .    8. Agitation: confused but less agitated.   -seroquel  -klonopin and librium stopped due to lethargy  -depakote 1000 mg bid -sleeping well  -appreciate psych assessment and input--- 9. ?GERD/odynophagia/dysphagia-D2 thins  -PEG out  LOS (Days) 71 A FACE TO FACE EVALUATION WAS PERFORMED  Ravi Tuccillo T 05/19/2014 9:07 AM

## 2014-05-19 NOTE — Progress Notes (Signed)
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 Patient pushed cart throughout hospital including on/off elevators and interacted with hospital staff and visitors appropriately with min cues. Patient advertised [employer] sale and price range but required total A for cause for bake sale as he stated multiple times, "We are raising funds to start a new organization." Patient was unable to attend to pricing of specific items based on provided list/price tags. Upon returning to rehab unit, patient required min cues for orientation to date with utilization of external aid after asking for today's newspaper. Patient pleased with amount of interest in bake sale and success of task at end of session.   Therapy Documentation Precautions:  Precautions Precautions: None Precaution Comments: Elopement risk Restrictions Weight Bearing Restrictions: No Pain: Pain Assessment Pain Assessment: No/denies pain  See FIM for current functional status  Therapy/Group: Individual Therapy  [name], [name] A 05/19/2014, 11:20 AM

## 2014-05-19 NOTE — Progress Notes (Signed)
Speech Language Pathology Daily Session Note  Patient Details  Name: Justin Pugh MRN: [id number] Date of Birth: 1955/10/01  Today's Date: 05/19/2014 SLP Individual Time: 0800-0900 SLP Individual Time Calculation (min): 60 min  Short Term Goals: SLP Short Term Goal 1 (Week 10): Patient will orient to time, place and situation with Mod A multimodal cues for use of external/environmental aids.  SLP Short Term Goal 2 (Week 10): Patient will sustain attention to a functional task for 15 minutes with Mod A multimodal cues for redirection. SLP Short Term Goal 3 (Week 10): Patient will masticate trials of Dys. 3 textures with Min A multimodal cues for sustained attention to bolus and clearance of residuals from oral cavity across 3 consecutive sessions.  SLP Short Term Goal 4 (Week 10): Patient will plan, organize and execute problem solving during basic to semi-complex functional tasks with Min assist verbal cues for use of compensatory strategies.  Skilled Therapeutic Interventions: Skilled treatment session focused on cognitive and dysphagia goals. Upon arrival, patient was seated in room completing breakfast of Dys. 2 textures and thin liquids, which he consumed with no overt s/s of aspiration. Student facilitated session with Min A verbal cues and repetitions for patient attention to rules to task. Student further facilitated session by providing Min A verbal and visual cues for functional problem-solving associated with task and for alternating attention between task and patient conversation in a mildly distracting environment for ~15 minutes. Patient continues to demonstrate hyperverbose speech and decreased turn-taking and topic maintenance today. Patient independently oriented to date and year, and oriented to month with Mod A verbal context cues. Student further facilitated session with observation of trials of Dys. 3 textures; patient with efficient mastication and no overt s/s of  aspiration but increased verbosity increases aspiration risk. Patient returned to room and left with all needs within reach. Continue with current plan of care.   FIM:  Comprehension Comprehension Mode: Auditory Comprehension: 4-Understands basic 75 - 89% of the time/requires cueing 10 - 24% of the time Expression Expression Mode: Verbal Expression: 4-Expresses basic 75 - 89% of the time/requires cueing 10 - 24% of the time. Needs helper to occlude trach/needs to repeat words. Social Interaction Social Interaction: 4-Interacts appropriately 75 - 89% of the time - Needs redirection for appropriate language or to initiate interaction. Problem Solving Problem Solving: 3-Solves basic 50 - 74% of the time/requires cueing 25 - 49% of the time Memory Memory: 2-Recognizes or recalls 25 - 49% of the time/requires cueing 51 - 75% of the time FIM - Eating Eating Activity: 5: Supervision/cues  Pain Pain Assessment Pain Assessment: No/denies pain  Therapy/Group: Individual Therapy  Tacey RuizKatherine Taleeyah Bora 05/19/2014, 12:42 PM

## 2014-05-19 NOTE — Progress Notes (Signed)
Recreational Therapy Session Note  Patient Details  Name: Justin Pugh MRN: [id number] Date of Birth: Oct 15, 1955 Today's Date: 05/19/2014  Pain: no c/o Skilled Therapeutic Interventions/Progress Updates: Session focused on activity tolerance, functional mobility throughout hospital negotiating obstacles, selective & divided attention, emergent awareness, socially appropriate behavior, interpersonal communication, and problem solving.  Pt ambulated throughout hospital pushing a cart with baked good [facility] with supervision.  Pt required supervision/min cues for interactions with staff and visitors and to list price ranges for bake goods.  Pt confabulating about reason for bake sale, requiring total cues to provide accurate information.    Therapy/Group: Co-Treatment   Khamari Yousuf 05/19/2014, 4:14 PM
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
## 2014-05-20 ENCOUNTER — Inpatient Hospital Stay (HOSPITAL_COMMUNITY): Payer: Medicaid Other | Admitting: Speech Pathology

## 2014-05-20 ENCOUNTER — Inpatient Hospital Stay (HOSPITAL_COMMUNITY): Payer: Medicaid Other | Admitting: Physical Therapy

## 2014-05-20 ENCOUNTER — Inpatient Hospital Stay (HOSPITAL_COMMUNITY): Payer: Self-pay | Admitting: Occupational Therapy

## 2014-05-20 ENCOUNTER — Inpatient Hospital Stay (HOSPITAL_COMMUNITY): Payer: Medicaid Other | Admitting: *Deleted

## 2014-05-20 NOTE — Progress Notes (Signed)
Physical Therapy Session Note  Patient Details  Name: Justin CohoJeffrey L Wandel MRN: [id number] Date of Birth: August 26, 1955  Today's Date: 05/20/2014 PT Individual Time: 1130-1200 PT Individual Time Calculation (min): 30 min   Short Term Goals: Week 4:  PT Short Term Goal 1 (Week 4): = LTGs   Skilled Therapeutic Interventions/Progress Updates:   Session focused on orientation, short term memory, functional problem solving, anticipatory awareness, and selective attention. Patient independently recalled events from past few days including preparing for bake sale and pushing cart through hospital for bake sale but unable to identify this clinician as person who completed task with him. Patient reported that he used to exercise but hasn't been able to after reviewing men's health magazine on bed. Patient agreeable to exercising with therapist. Patient participated in structured exercise program to address upper and lower body with therapist and able to complete as directed with min cues for sets and reps as well as safety recommendations for modified pushups on mat table and lat pull down bar in ortho gym and walking lunges and squats in day room. Patient prepared cup of hot chocolate and placed additional sugar packets back after therapist pointed out that hot chocolate already has sugar in it. Patient left in room.   Therapy Documentation Precautions:  Precautions Precautions: None Precaution Comments: Elopement risk Restrictions Weight Bearing Restrictions: No  Pain: Pain Assessment Pain Assessment: No/denies pain Locomotion : Ambulation Ambulation/Gait Assistance: 7: Independent   See FIM for current functional status  Therapy/Group: Individual Therapy  [name], Tyrea Froberg A 05/20/2014, 12:25 PM

## 2014-05-20 NOTE — Progress Notes (Signed)
Speech Language Pathology Daily Session Note  Patient Details  Name: Justin CohoJeffrey L Pugh MRN: [id number] Date of Birth: [birth year]  Today's Date: 05/20/2014 SLP Individual Time: [id number] SLP Individual Time Calculation (min): 30 min  Short Term Goals: Week 10: SLP Short Term Goal 1 (Week 10): Patient will orient to time, place and situation with Mod A multimodal cues for use of external/environmental aids.  SLP Short Term Goal 2 (Week 10): Patient will sustain attention to a functional task for 15 minutes with Mod A multimodal cues for redirection. SLP Short Term Goal 3 (Week 10): Patient will masticate trials of Dys. 3 textures with Min A multimodal cues for sustained attention to bolus and clearance of residuals from oral cavity across 3 consecutive sessions.  SLP Short Term Goal 4 (Week 10): Patient will plan, organize and execute problem solving during basic to semi-complex functional tasks with Min assist verbal cues for use of compensatory strategies.  Skilled Therapeutic Interventions: Skilled treatment session focused on cognitive goals. Upon arrival, patient was awake while sitting upright in the chair and was agreeable to participate in treatment session.  Patient independently recalled events from previous therapy session as well as intermittent events with this clinician from previous sessions earlier in the week (making sign for bake sale, etc). Patient requested assistance from this SLP to read his current diagnosis that was written on a piece of paper that had come up with his breakfast tray.  Patient then requested definitions for "oral" and "dysphagia." This SLP showed the patient the definitions located in the "Brain Injury Book" that was received in his CIR notebook as well as provide education to the patient in regards to his current brain injury and level of recovery. The patient asked this clinician to "star" the selections that he needed to read and reported that he would "read  over them later," however, patient also reported, "I think I am the same person I was 20 years ago."  RN present at end of session and patient was able to independently recall the "routine" to medication administration and located the appropriate ointment and gauze needed for his dressing change that was in his drawer.  Patient left in room with RN present. Continue with current plan of care.    FIM:  Comprehension Comprehension Mode: Auditory Comprehension: 4-Understands basic 75 - 89% of the time/requires cueing 10 - 24% of the time Expression Expression Mode: Verbal Expression: 4-Expresses basic 75 - 89% of the time/requires cueing 10 - 24% of the time. Needs helper to occlude trach/needs to repeat words. Social Interaction Social Interaction: 4-Interacts appropriately 75 - 89% of the time - Needs redirection for appropriate language or to initiate interaction. Problem Solving Problem Solving: 3-Solves basic 50 - 74% of the time/requires cueing 25 - 49% of the time Memory Memory: 2-Recognizes or recalls 25 - 49% of the time/requires cueing 51 - 75% of the time FIM - Eating Eating Activity: 5: Supervision/cues  Pain Pain Assessment Pain Assessment: No/denies pain  Therapy/Group: Individual Therapy  Damek Ende 05/20/2014, 11:54 AM

## 2014-05-20 NOTE — Progress Notes (Signed)
Recreational Therapy Session Note  Patient Details  Name: Katrine CohoJeffrey L Aguinaldo MRN: [id number] Date of Birth: 11/28/1955 Today's Date: 05/20/2014  Pain: no c/o Skilled Therapeutic Interventions/Progress Updates: Session focused on selective attention and topic maintenance during conversation.  Initiated discussion with pt about meal prep activity as pt has previously stated he enjoyed cooking.   Pt stated he had been reading over information he had been given about why he was here in the hospital and who to contact to in order to get his medical records.  Pt required max cues to be redirected to topic throughout 30 minute session.  Therapy/Group: Individual Therapy  Alin Chavira 05/20/2014, 4:52 PM
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
## 2014-05-20 NOTE — Progress Notes (Signed)
[facility] PHYSICAL MEDICINE & REHABILITATION     PROGRESS NOTE    Subjective/Complaints:   No issues ROS--    Objective: Vital Signs: Blood pressure 104/58, pulse 53, temperature 97.7 F (36.5 C), temperature source Oral, resp. rate 18, height 6' (1.829 m), weight 89.495 kg (197 lb 4.8 oz), SpO2 98 %. No results found. No results for input(s): WBC, HGB, HCT, PLT in the last 72 hours. No results for input(s): NA, K, CL, GLUCOSE, BUN, CREATININE, CALCIUM in the last 72 hours.  Invalid input(s): CO CBG (last 3)  No results for input(s): GLUCAP in the last 72 hours.  Wt Readings from Last 3 Encounters:  05/19/14 89.495 kg (197 lb 4.8 oz)  03/09/14 84.188 kg (185 lb 9.6 oz)  01/23/14 95.255 kg (210 lb)    Physical Exam:  Constitutional: He appears well-developed and well-nourished. He is sleeping. He is easily aroused.  HENT:  Head: Normocephalic.  Neck: supple Cardiovascular: Normal rate and regular rhythm.  Respiratory: Effort normal. No respiratory distress.  GI: Soft. Bowel sounds are normal. He exhibits no distension. There is mild abdominal tenderness. Minimal drainage at former tube site. Musculoskeletal: He exhibits no edema.  Neurological: He is alert today.        Moves all extremities without difficulty.    Skin: Skin is warm and dry.  Psychiatric:  Confused but more appropriate. Showing improved insight and awareness  Assessment/Plan: 1. 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 Physiatrist is providing close team supervision and 24 hour management of active medical problems listed below. Physiatrist and rehab team continue to assess barriers to discharge/monitor patient progress toward functional and medical goals. Appreciate trauma note, contrast seen in stomach and duodenum although RN states there is some leakage around stoma FIM: FIM -  Bathing Bathing Steps Patient Completed: Chest, Abdomen, Front perineal area, Right Arm, Left Arm, Buttocks, Right upper leg, Left upper leg, Left lower leg (including foot), Right lower leg (including foot) Bathing: 6: More than reasonable amount of time  FIM - Upper Body Dressing/Undressing Upper body dressing/undressing steps patient completed: Thread/unthread right sleeve of pullover shirt/dresss, Thread/unthread left sleeve of pullover shirt/dress, Put head through opening of pull over shirt/dress, Pull shirt over trunk Upper body dressing/undressing: 6: More than reasonable amount of time FIM - Lower Body Dressing/Undressing Lower body dressing/undressing steps patient completed: Thread/unthread right underwear leg, Thread/unthread left underwear leg, Pull underwear up/down, Thread/unthread right pants leg, Thread/unthread left pants leg, Pull pants up/down, Don/Doff right sock, Don/Doff left sock, Fasten/unfasten right shoe, Don/Doff left shoe, Don/Doff right shoe, Fasten/unfasten left shoe Lower body dressing/undressing: 5: Supervision: Safety issues/verbal cues  FIM - Toileting Toileting steps completed by patient: Adjust clothing prior to toileting, Performs perineal hygiene, Adjust clothing after toileting Toileting Assistive Devices: Grab bar or rail for support Toileting: 7: Independent: No helper, no device  FIM - Diplomatic Services operational officer Devices: Therapist, music Transfers: 7-Independent: No helper, 7-From toilet/BSC  FIM - Banker Devices: Arm rests Bed/Chair Transfer: 7: Independent: No helper  FIM - Locomotion: Wheelchair Locomotion: Wheelchair: 0: Activity did not occur FIM - Locomotion: Ambulation Locomotion: Ambulation Assistive Devices: Other (comment) (none) Ambulation/Gait Assistance: 7: Independent Locomotion: Ambulation: 7: Travels 150 ft or more independently  Comprehension Comprehension Mode:  Auditory Comprehension: 4-Understands basic 75 - 89% of the time/requires cueing 10 - 24% of the time  Expression Expression Mode: Verbal Expression:  4-Expresses basic 75 - 89% of the time/requires cueing 10 - 24% of the time. Needs helper to occlude trach/needs to repeat words.  Social Interaction Social Interaction Mode: Not assessed Social Interaction: 4-Interacts appropriately 75 - 89% of the time - Needs redirection for appropriate language or to initiate interaction.  Problem Solving Problem Solving: 3-Solves basic 50 - 74% of the time/requires cueing 25 - 49% of the time  Memory Memory: 2-Recognizes or recalls 25 - 49% of the time/requires cueing 51 - 75% of the time  Medical Problem List and Plan: 1. Functional deficits secondary to TBI   2. DVT Prophylaxis/Anticoagulation: Pharmaceutical: Lovenox 3. Pain Management: Will continue oxycodone prn. Will likely need premedication as unable to express needs. Will monitor for now.  4. Mood: Unable to gauge at this time due to cognitive deficits. LCSW to follow along for evaluation and support once more appropriate.  5. Neuropsych: This patient is not capable of making decisions on her own behalf. 6. Skin/Wound Care: pt is mobile 7. Fluids/Electrolytes/Nutrition:megace stoppped .    8. Agitation: confused but less agitated.   -seroquel  -klonopin and librium stopped due to lethargy  -depakote 1000 mg bid -sleeping well  -appreciate psych assessment and input--- 9. ?GERD/odynophagia/dysphagia-D2 thins  -PEG out  LOS (Days) 72 A FACE TO FACE EVALUATION WAS PERFORMED  SWARTZ,ZACHARY T 05/20/2014 9:30 AM

## 2014-05-21 ENCOUNTER — Inpatient Hospital Stay (HOSPITAL_COMMUNITY): Payer: Medicaid Other | Admitting: Physical Therapy

## 2014-05-21 ENCOUNTER — Inpatient Hospital Stay (HOSPITAL_COMMUNITY): Payer: Self-pay

## 2014-05-21 ENCOUNTER — Inpatient Hospital Stay (HOSPITAL_COMMUNITY): Payer: Medicaid Other | Admitting: Speech Pathology

## 2014-05-21 MED ORDER — SUCRALFATE 1 GM/10ML PO SUSP: 1.0000 g | Freq: Three times a day (TID) | ORAL | Status: DC

## 2014-05-21 MED ORDER — VALPROIC ACID 250 MG PO CAPS: 1000.0000 mg | ORAL_CAPSULE | Freq: Two times a day (BID) | ORAL | Status: DC

## 2014-05-21 MED ORDER — TUBERCULIN PPD 5 UNIT/0.1ML ID SOLN
5.0000 [IU] | Freq: Once | INTRADERMAL | Status: DC
  Administered 2014-05-21: 5 [IU] via INTRADERMAL
  Filled 2014-05-21: qty 0.1

## 2014-05-21 MED ORDER — QUETIAPINE FUMARATE 200 MG PO TABS: 200.0000 mg | ORAL_TABLET | Freq: Two times a day (BID) | ORAL | Status: DC

## 2014-05-21 MED ORDER — POLYETHYLENE GLYCOL 3350 17 G PO PACK: 17.0000 g | PACK | Freq: Every day | ORAL | Status: DC

## 2014-05-21 MED ORDER — ALUM & MAG HYDROXIDE-SIMETH 200-200-20 MG/5ML PO SUSP: 30.0000 mL | ORAL | Status: DC | PRN

## 2014-05-21 MED ORDER — PANTOPRAZOLE SODIUM 40 MG PO TBEC: 40.0000 mg | DELAYED_RELEASE_TABLET | Freq: Two times a day (BID) | ORAL | Status: DC

## 2014-05-21 MED ORDER — NICOTINE 21 MG/24HR TD PT24: 21.0000 mg | MEDICATED_PATCH | Freq: Every day | TRANSDERMAL | Status: DC

## 2014-05-21 MED ORDER — CLONIDINE HCL 0.1 MG/24HR TD PTWK: 0.1000 mg | MEDICATED_PATCH | TRANSDERMAL | Status: DC

## 2014-05-21 MED ORDER — ACETAMINOPHEN 160 MG/5ML PO SOLN: 650.0000 mg | Freq: Three times a day (TID) | ORAL | Status: DC
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
## 2014-05-21 NOTE — Progress Notes (Signed)
Speech Language Pathology Daily Session Note and Discharge Summary  Patient Details  Name: Justin Pugh MRN: [id number] Date of Birth: 1955-12-24  Today's Date: 05/21/2014 SLP Individual Time: 0830-0900 SLP Individual Time Calculation (min): 30 min  Skilled Therapeutic Interventions: Skilled treatment session focused on cognitive goals. Upon arrival, patient was supine in bed, awake, and agreeable to participate in therapy. Student facilitated session with functional organization task, for which patient required Supervision A verbal and visual cues for initiation, organization, and basic problem solving. Patient required Min A verbal cues to sustain attention for ~25 minutes in a quiet environment and continues to demonstrate hyperverbose language with decreased turn-taking, but demonstrated increased topic maintenance this session. Patient returned to room and left with all needs within reach. Continue with current plan of care.  Patient has met 3 of 3 long term goals.  Patient to discharge at overall Max (Mod I for swallowing; overall Max A for cognition) level.  Reasons goals not met: n/a   Clinical Impression/Discharge Summary: Patient has made functional gains and met 3 out of 3 LTGs this admission due to increased swallowing function, use of external aids for recall, and increased sustained attention. Patient is currently demonstrating behaviors consistent with Rancho Level VI with emerging Rancho Level VII behaviors. Patient exhibits hyperverbose speech with decreased topic maintenance and turn-taking and demonstrates behavioral impairments characterized by impulsivity and low frustration tolerance, and requires Max A for redirection to functional tasks. Patient furthermore requires Min-Mod A verbal cues for selective attention for ~30 minutes in a quiet environment, Mod A visual and verbal cues for orientation, Mod-Max A verbal and visual cues for mildly complex problem-solving, Max A verbal  cues for safety awareness, and Max-Total A verbal and visual cues for insight and intellectual awareness of cognitive deficits. Patient is currently tolerating current diet of Dys. 2 textures and thin liquids with no overt s/s of aspiration and utilizing safe swallow strategies with Mod I; suspect at this time patient will remain on Dys. 2 textures and thin liquids due to cognitive deficits. Patient education completed and will be ongoing at next venue of care. Patient to discharge to a ALF to receive 24-hour supervision, where he would continue to benefit from skilled SLP intervention to maximize cognitive and swallowing functioning, thereby enhancing functional independence and reducing caregiver burden.  Care Partner:  Caregiver Able to Provide Assistance: No  Type of Caregiver Assistance: Cognitive  Recommendation:  24 hour supervision/assistance;Other (comment) (ALF)  Rationale for SLP Follow Up: Maximize cognitive function and independence;Maximize swallowing safety   Equipment:  n/a   Reasons for discharge: Discharged from hospital;Treatment goals met   Patient/Family Agrees with Progress Made and Goals Achieved: Yes   See FIM for current functional status  Servando Snare 05/21/2014, 4:36 PM

## 2014-05-21 NOTE — Progress Notes (Addendum)
Social Work Patient ID: Justin Pugh, male   DOB: January 27, 1956, 59 y.o.   MRN: [id number]   Have secured an ALF bed offer [facility] [place] [facility] [place] with planned transfer to take place tomorrow.  Have informed pt of plans and he appears to have some minimal awareness of this plan to leave CIR and move to another facility.  He does ask for some bags to begin packing his clothes, however, then asks "Do I need to wear handcuffs out of here?"  Attempted to reassure him that he is simply moving to another care facility and NOT going to jail.  Have left a message for mother and await contact from her to confirm that she can meet pt at facility to complete admission paperwork.   Have alerted tx team and MD - all aware.  Darryon Bastin, LCSW   Have spoken with pt's mother who is in agreement with above plans.  Have made reservation with [facility] transportation who will pick up pt at 8:30 am tomorrow. Mother to meet pt at facility late morning.  Tx team, nursing and PA aware of early morning d/c plans.  Kirk Sampley, LCSW

## 2014-05-21 NOTE — Progress Notes (Signed)
[place] PHYSICAL MEDICINE & REHABILITATION     PROGRESS NOTE    Subjective/Complaints:  up with therapy ROS--    Objective: Vital Signs: Blood pressure 112/76, pulse 72, temperature 98.6 F (37 C), temperature source Oral, resp. rate 18, height 6' (1.829 m), weight 89.585 kg (197 lb 8 oz), SpO2 100 %. No results found. No results for input(s): WBC, HGB, HCT, PLT in the last 72 hours. No results for input(s): NA, K, CL, GLUCOSE, BUN, CREATININE, CALCIUM in the last 72 hours.  Invalid input(s): CO CBG (last 3)  No results for input(s): GLUCAP in the last 72 hours.  Wt Readings from Last 3 Encounters:  05/20/14 89.585 kg (197 lb 8 oz)  03/09/14 84.188 kg (185 lb 9.6 oz)  01/23/14 95.255 kg (210 lb)    Physical Exam:  Constitutional: He appears well-developed and well-nourished. He is sleeping. He is easily aroused.  HENT:  Head: Normocephalic.  Neck: supple Cardiovascular: Normal rate and regular rhythm.  Respiratory: Effort normal. No respiratory distress.  GI: Soft. Bowel sounds are normal. He exhibits no distension. There is mild abdominal tenderness. Minimal drainage at former tube site. Musculoskeletal: He exhibits no edema.  Neurological: He is alert today.        Moves all extremities without difficulty.    Skin: Skin is warm and dry.  Psychiatric:  Confused but more appropriate. Showing improved insight and awareness  Assessment/Plan: 1. 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 Physiatrist is providing close team supervision and 24 hour management of active medical problems listed below. Physiatrist and rehab team continue to assess barriers to discharge/monitor patient progress toward functional and medical goals. Appreciate trauma note, contrast seen in stomach and duodenum although RN states there is some leakage around stoma FIM: FIM -  Bathing Bathing Steps Patient Completed: Chest, Abdomen, Front perineal area, Right Arm, Left Arm, Buttocks, Right upper leg, Left upper leg, Left lower leg (including foot), Right lower leg (including foot) Bathing: 6: More than reasonable amount of time  FIM - Upper Body Dressing/Undressing Upper body dressing/undressing steps patient completed: Thread/unthread right sleeve of pullover shirt/dresss, Thread/unthread left sleeve of pullover shirt/dress, Put head through opening of pull over shirt/dress, Pull shirt over trunk Upper body dressing/undressing: 6: More than reasonable amount of time FIM - Lower Body Dressing/Undressing Lower body dressing/undressing steps patient completed: Thread/unthread right underwear leg, Thread/unthread left underwear leg, Pull underwear up/down, Thread/unthread right pants leg, Thread/unthread left pants leg, Pull pants up/down, Don/Doff right sock, Don/Doff left sock, Fasten/unfasten right shoe, Don/Doff left shoe, Don/Doff right shoe, Fasten/unfasten left shoe Lower body dressing/undressing: 5: Supervision: Safety issues/verbal cues  FIM - Toileting Toileting steps completed by patient: Adjust clothing prior to toileting, Performs perineal hygiene, Adjust clothing after toileting Toileting Assistive Devices: Grab bar or rail for support Toileting: 7: Independent: No helper, no device  FIM - Diplomatic Services operational officerToilet Transfers Toilet Transfers Assistive Devices: Therapist, musicGrab bars Toilet Transfers: 7-Independent: No helper, 7-From toilet/BSC  FIM - BankerBed/Chair Transfer Bed/Chair Transfer Assistive Devices: Arm rests Bed/Chair Transfer: 7: Independent: No helper  FIM - Locomotion: Wheelchair Locomotion: Wheelchair: 0: Activity did not occur FIM - Locomotion: Ambulation Locomotion: Ambulation Assistive Devices: Other (comment) (none) Ambulation/Gait Assistance: 7: Independent Locomotion: Ambulation: 7: Travels 150 ft or more independently  Comprehension Comprehension Mode:  Auditory Comprehension: 4-Understands basic 75 - 89% of the time/requires cueing 10 - 24% of the time  Expression Expression Mode: Verbal Expression:  4-Expresses basic 75 - 89% of the time/requires cueing 10 - 24% of the time. Needs helper to occlude trach/needs to repeat words.  Social Interaction Social Interaction Mode: Not assessed Social Interaction: 4-Interacts appropriately 75 - 89% of the time - Needs redirection for appropriate language or to initiate interaction.  Problem Solving Problem Solving: 3-Solves basic 50 - 74% of the time/requires cueing 25 - 49% of the time  Memory Memory: 2-Recognizes or recalls 25 - 49% of the time/requires cueing 51 - 75% of the time  Medical Problem List and Plan: 1. Functional deficits secondary to TBI   2. DVT Prophylaxis/Anticoagulation: Pharmaceutical: Lovenox 3. Pain Management: Will continue oxycodone prn. Will likely need premedication as unable to express needs. Will monitor for now.  4. Mood: Unable to gauge at this time due to cognitive deficits. LCSW to follow along for evaluation and support once more appropriate.  5. Neuropsych: This patient is not capable of making decisions on her own behalf. 6. Skin/Wound Care: pt is mobile 7. Fluids/Electrolytes/Nutrition:megace stoppped .    8. Agitation: confused non agitated   -seroquel   -depakote 1000 mg bid -sleeping well   - 9. ?GERD/odynophagia/dysphagia-D2 thins--eating well  -PEG out  LOS (Days) 73 A FACE TO FACE EVALUATION WAS PERFORMED  SWARTZ,Justin Pugh 05/21/2014 9:21 AM

## 2014-05-21 NOTE — Progress Notes (Signed)
Physical Therapy Session Note  Patient Details  Name: Justin Pugh MRN: [id number] Date of Birth: 05/08/1955  Today's Date: 05/21/2014 PT Individual Time: 0955-1030 PT Individual Time Calculation (min): 35 min   Short Term Goals: Week 4:  PT Short Term Goal 1 (Week 4): = LTGs   Skilled Therapeutic Interventions/Progress Updates:   Session focused on attention, organization, and functional problem solving. Patient received in room, independently engaging therapist in therapeutic conversation regarding cooking magazine and stating bake sale could have more success if pies (pictured in magazine) were utilized. Patient participated in functional task of path finding from rehab unit to recreational therapist's car in parking garage to retrieve grocery bags and unload groceries in ADL kitchen. Patient demonstrated hyperverbose language with decreased attention to task this date, requiring mod-max cues to complete task of retrieving bags but able to appropriately unload groceries in kitchen in organized manner with supervision. Patient perseverating on negative interaction with night shift workers during session. Patient retrieved hot chocolate and returned graham crackers to therapist with one cue before returning to room with all needs within reach.  Therapy Documentation Precautions:  Precautions Precautions: None Precaution Comments: Elopement risk Restrictions Weight Bearing Restrictions: No Pain: Pain Assessment Pain Assessment: No/denies pain Locomotion : Ambulation Ambulation/Gait Assistance: 7: Independent   See FIM for current functional status  Therapy/Group: Individual Therapy  [name], Euphemia Lingerfelt A 05/21/2014, 10:48 AM

## 2014-05-21 NOTE — Progress Notes (Signed)
Recreational Therapy Discharge Summary Patient Details  Name: COBEY RAINERI MRN: [id number] Date of Birth: 1955-09-18 Today's Date: 05/21/2014  Long term goals set: 1  Long term goals met: 1  Comments on progress toward goals: Pt is scheduled for discharge to a locked ALF tomorrow for 24 hour supervision.  Pt is discharging at overall supervision level with mod cues for completion of moderately complex TR tasks.  Pt is most limited by decreased cognition effecting safe completion of tasks.  Reasons for discharge: discharge from hospital  Patient/family agrees with progress made and goals achieved: Yes  Fenna Semel 05/21/2014, 3:27 PM

## 2014-05-22 NOTE — Progress Notes (Signed)
[facility] [place] [facility]. Patient discharged with belongings. Mother to meet patient at new facility. Patient received morning medications before discharge. Patient had no complaints of pain at time of discharge.

## 2014-05-22 NOTE — Progress Notes (Signed)
Social Work  Discharge Note  The overall goal for the admission was met for:   Discharge location: Yes - upon admission, it was known and understood that pt would need to transfer to SNF/ ALF at d/c as family could not provide anticipated supervision needs.  Length of Stay: No - LOS=74 days - pt's d/c planning issues/ barriers were multiple, including earlier behavioral issues and then medical issues.  Also, pt without insurance coverage so required LOG for placement.    Discharge activity level: Yes - supervision  Home/community participation: Yes  Services provided included: MD, RD, PT, OT, SLP, RN, TR, Pharmacy and SW  Financial Services: Other: Medicaid and SSD applications remain "pending approval" at time of pt's d/c.  Follow-up services arranged: Other: ALF placement at [facility]. Transportation provided by [facility].  Comments (or additional information):  Patient/Family verbalized understanding of follow-up arrangements: Yes Mother in full agreement with d/c plans. Pt with minimal understanding of the plans.  Individual responsible for coordination of the follow-up plan: mother  Confirmed correct DME delivered: NA    Danniell Rotundo

## 2014-05-22 NOTE — Progress Notes (Signed)
Physical Therapy Discharge Summary  Patient Details  Name: Justin Pugh MRN: [id number] Date of Birth: 1955-10-27  Patient has met 10 of 14 long term goals due to improved activity tolerance, improved balance, improved postural control, increased strength, decreased pain, ability to compensate for deficits, improved attention, improved awareness, improved problem solving, sequencing, and organization, improved safety awareness, and improved coordination. Patient is currently demonstrating behaviors consistent with Rancho Level VI with emerging Rancho Level VII behaviors. Patient to discharge at an ambulatory level Supervision due to cognitive impairments. Patient's care partner unavailable to provide the necessary cognitive assistance at discharge, therefore discharge planned to ALF with 24/7 supervision.  Reasons goals not met: Patient continues to require max A for awareness of cognitive deficits.   Recommendations: Patient would benefit from continued therapies addressing cognitive impairments at next level of care to maximize functional independence and reduce caregiver burden.   Equipment: No equipment provided  Reasons for discharge: treatment goals met and discharge from hospital  Patient/family agrees with progress made and goals achieved: Yes  PT Discharge Precautions/Restrictions Precautions Precaution Comments: Elopement risk Restrictions Weight Bearing Restrictions: No Pain Pain Assessment Pain Assessment: No/denies pain Vision/Perception   No changes from baseline  Cognition Overall Cognitive Status: Impaired/Different from baseline Arousal/Alertness: Awake/alert Orientation Level: Oriented to person;Oriented to place;Disoriented to situation;Disoriented to time Attention: Sustained Focused Attention: Appears intact Focused Attention Impairment: Verbal basic;Functional basic Sustained Attention: Impaired Sustained Attention Impairment: Verbal complex;Functional  complex Selective Attention: Impaired Selective Attention Impairment: Verbal complex;Functional complex Alternating Attention: Appears intact Alternating Attention Impairment: Verbal complex;Functional complex Memory: Impaired Memory Impairment: Decreased recall of new information;Decreased short term memory Decreased Short Term Memory: Verbal complex;Functional complex Awareness: Impaired Awareness Impairment: Emergent impairment;Intellectual impairment;Anticipatory impairment Problem Solving: Impaired Problem Solving Impairment: Verbal complex;Functional complex Executive Function: Reasoning;Self Monitoring;Self Correcting Reasoning: Impaired Reasoning Impairment: Verbal complex;Functional complex Self Monitoring: Impaired Self Monitoring Impairment: Verbal complex;Functional complex Self Correcting: Impaired Self Correcting Impairment: Verbal complex;Functional complex Behaviors: Confabulation Safety/Judgment: Impaired Rancho [employer] Scales of Cognitive Functioning: Confused/appropriate Sensation Sensation Light Touch: Appears Intact Hot/Cold: Appears Intact Proprioception: Appears Intact Coordination Gross Motor Movements are Fluid and Coordinated: Yes Fine Motor Movements are Fluid and Coordinated: Yes Motor  Motor Motor: Within Functional Limits  Mobility Bed Mobility Bed Mobility: Supine to Sit;Sit to Supine Supine to Sit: 7: Independent Sit to Supine: 7: Independent Transfers Transfers: Yes Sit to Stand: 7: Independent Stand to Sit: 7: Independent Stand Pivot Transfers: 7: Independent Locomotion  Ambulation Ambulation: Yes Ambulation/Gait Assistance: 7: Independent Ambulation Distance (Feet): 1000 Feet Assistive device: None Gait Gait: Yes Gait Pattern: Within Functional Limits Gait velocity: WFL Stairs / Additional Locomotion Stairs: Yes Stairs Assistance: 6: Modified independent (Device/Increase time) Stair Management Technique: One rail  Right;Alternating pattern;Forwards Number of Stairs: 12 Height of Stairs: 6 Ramp: 7: Independent Curb: 7: Sales executive Mobility: No (pt ambulatory)  Trunk/Postural Assessment  Cervical Assessment Cervical Assessment: Within Functional Limits Thoracic Assessment Thoracic Assessment: Within Functional Limits Lumbar Assessment Lumbar Assessment: Within Functional Limits Postural Control Postural Control: Within Functional Limits  Balance Static Sitting Balance Static Sitting - Balance Support: Feet supported;No upper extremity supported Static Sitting - Level of Assistance: 7: Independent Dynamic Sitting Balance Dynamic Sitting - Balance Support: No upper extremity supported;Feet supported Dynamic Sitting - Level of Assistance: 7: Independent Static Standing Balance Static Standing - Balance Support: During functional activity;No upper extremity supported Static Standing - Level of Assistance: 7: Independent Dynamic Standing Balance Dynamic Standing - Balance Support: During functional  activity;No upper extremity supported Dynamic Standing - Level of Assistance: 7: Independent Extremity Assessment  RUE Assessment RUE Assessment: Within Functional Limits LUE Assessment LUE Assessment: Within Functional Limits RLE Assessment RLE Assessment: Within Functional Limits LLE Assessment LLE Assessment: Within Functional Limits  See FIM for current functional status  Laretta Alstrom 05/22/2014, 7:55 AM

## 2014-05-23 NOTE — Plan of Care (Signed)
Problem: RH Attention Goal: LTG Patient will demonstrate focused/sustained (OT) LTG: Patient will demonstrate focused/sustained/selective/alternating/divided attention during functional activities in specific environment with assist for # of minutes (OT)  Outcome: Not Met (add Reason) Not met as patient requires max cues for sustained attention  Problem: RH Awareness Goal: LTG: Patient will demonstrate intellectual/emergent (OT) LTG: Patient will demonstrate intellectual/emergent/anticipatory awareness with assist during a functional activity (OT)  Outcome: Not Met (add Reason) Not met as patient requires max cues for emergent awareness

## 2014-05-23 NOTE — Progress Notes (Signed)
Occupational Therapy Discharge Summary  Patient Details  Name: Justin Pugh MRN: [id number] Date of Birth: 01-Feb-1956  Today's Date: 05/23/2014   Patient has met 10 of 12 long term goals due to improved activity tolerance, improved balance, postural control, ability to compensate for deficits, improved attention and improved coordination.  Patient to discharge at overall Supervision level due to cognitive deficits. Patient requires mod-max assist for attention, problem solving, sequencing, organization, awareness, and safety awareness. Patient is currently demonstrating behaviors consistent with Rancho Level VI.   Patient's care partner is not necessary secondary to patient discharging to ALF.   Reasons goals not met: Patient did not meet attention and emergent awareness goals secondary to patient requiring max assist for cognition.   Recommendation:  Patient will benefit from ongoing skilled OT services in the next level of care to continue to advance functional skills in the area of cognitive impairments, safety awareness, activity tolerance, and attention.  Equipment: No equipment provided  Reasons for discharge: treatment goals met and discharge from hospital  Patient/family agrees with progress made and goals achieved: Yes  OT Discharge Precautions/Restrictions  Precautions Precaution Comments: Elopement risk Restrictions Weight Bearing Restrictions: No General   Vital Signs   Pain   ADL   Vision/Perception  Vision- History Baseline Vision/History: No visual deficits Patient Visual Report: No change from baseline Vision- Assessment Vision Assessment?: No apparent visual deficits  Cognition Overall Cognitive Status: Impaired/Different from baseline Arousal/Alertness: Awake/alert Orientation Level: Oriented to person;Oriented to place;Disoriented to situation;Oriented to time Attention: Sustained Focused Attention: Appears intact Sustained Attention:  Impaired Sustained Attention Impairment: Verbal complex;Functional complex Selective Attention: Impaired Selective Attention Impairment: Verbal complex;Functional complex Alternating Attention: Appears intact Alternating Attention Impairment: Verbal complex;Functional complex Memory: Impaired Memory Impairment: Decreased recall of new information;Decreased short term memory Awareness: Impaired Awareness Impairment: Emergent impairment;Intellectual impairment;Anticipatory impairment Executive Function:  (all impaired) Safety/Judgment: Impaired Rancho [employer] Scales of Cognitive Functioning: Confused/appropriate Sensation Sensation Light Touch: Appears Intact Hot/Cold: Appears Intact Proprioception: Appears Intact Coordination Gross Motor Movements are Fluid and Coordinated: Yes Fine Motor Movements are Fluid and Coordinated: Yes Motor  Motor Motor: Within Functional Limits Mobility  Bed Mobility Bed Mobility: Supine to Sit;Sit to Supine Supine to Sit: 7: Independent Sit to Supine: 7: Independent Transfers Transfers: Sit to Stand;Stand to Sit Sit to Stand: 7: Independent Stand to Sit: 7: Independent  Trunk/Postural Assessment  Cervical Assessment Cervical Assessment: Within Functional Limits Thoracic Assessment Thoracic Assessment: Within Functional Limits Lumbar Assessment Lumbar Assessment: Within Functional Limits Postural Control Postural Control: Within Functional Limits  Balance Balance Balance Assessed: Yes Dynamic Standing Balance Dynamic Standing - Balance Support: During functional activity;No upper extremity supported Dynamic Standing - Level of Assistance: 7: Independent Extremity/Trunk Assessment RUE Assessment RUE Assessment: Within Functional Limits (5/5 strength) LUE Assessment LUE Assessment: Within Functional Limits (5/5 strength)  See FIM for current functional status  Maleigha Colvard, Quillian Quince 05/23/2014, 2:31 PM

## 2014-07-21 ENCOUNTER — Inpatient Hospital Stay: Payer: Self-pay | Admitting: Physical Medicine & Rehabilitation

## 2014-09-08 ENCOUNTER — Encounter: Payer: Medicaid Other | Attending: Physical Medicine & Rehabilitation | Admitting: Physical Medicine & Rehabilitation
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
## 2016-11-06 IMAGING — CR DG CHEST 1V PORT
1 series · 1 of 1 positions shown · non-contrast
Comparison: 02/25/2014

CLINICAL DATA: Post intubation.

EXAM:
PORTABLE CHEST - 1 VIEW

[AP]
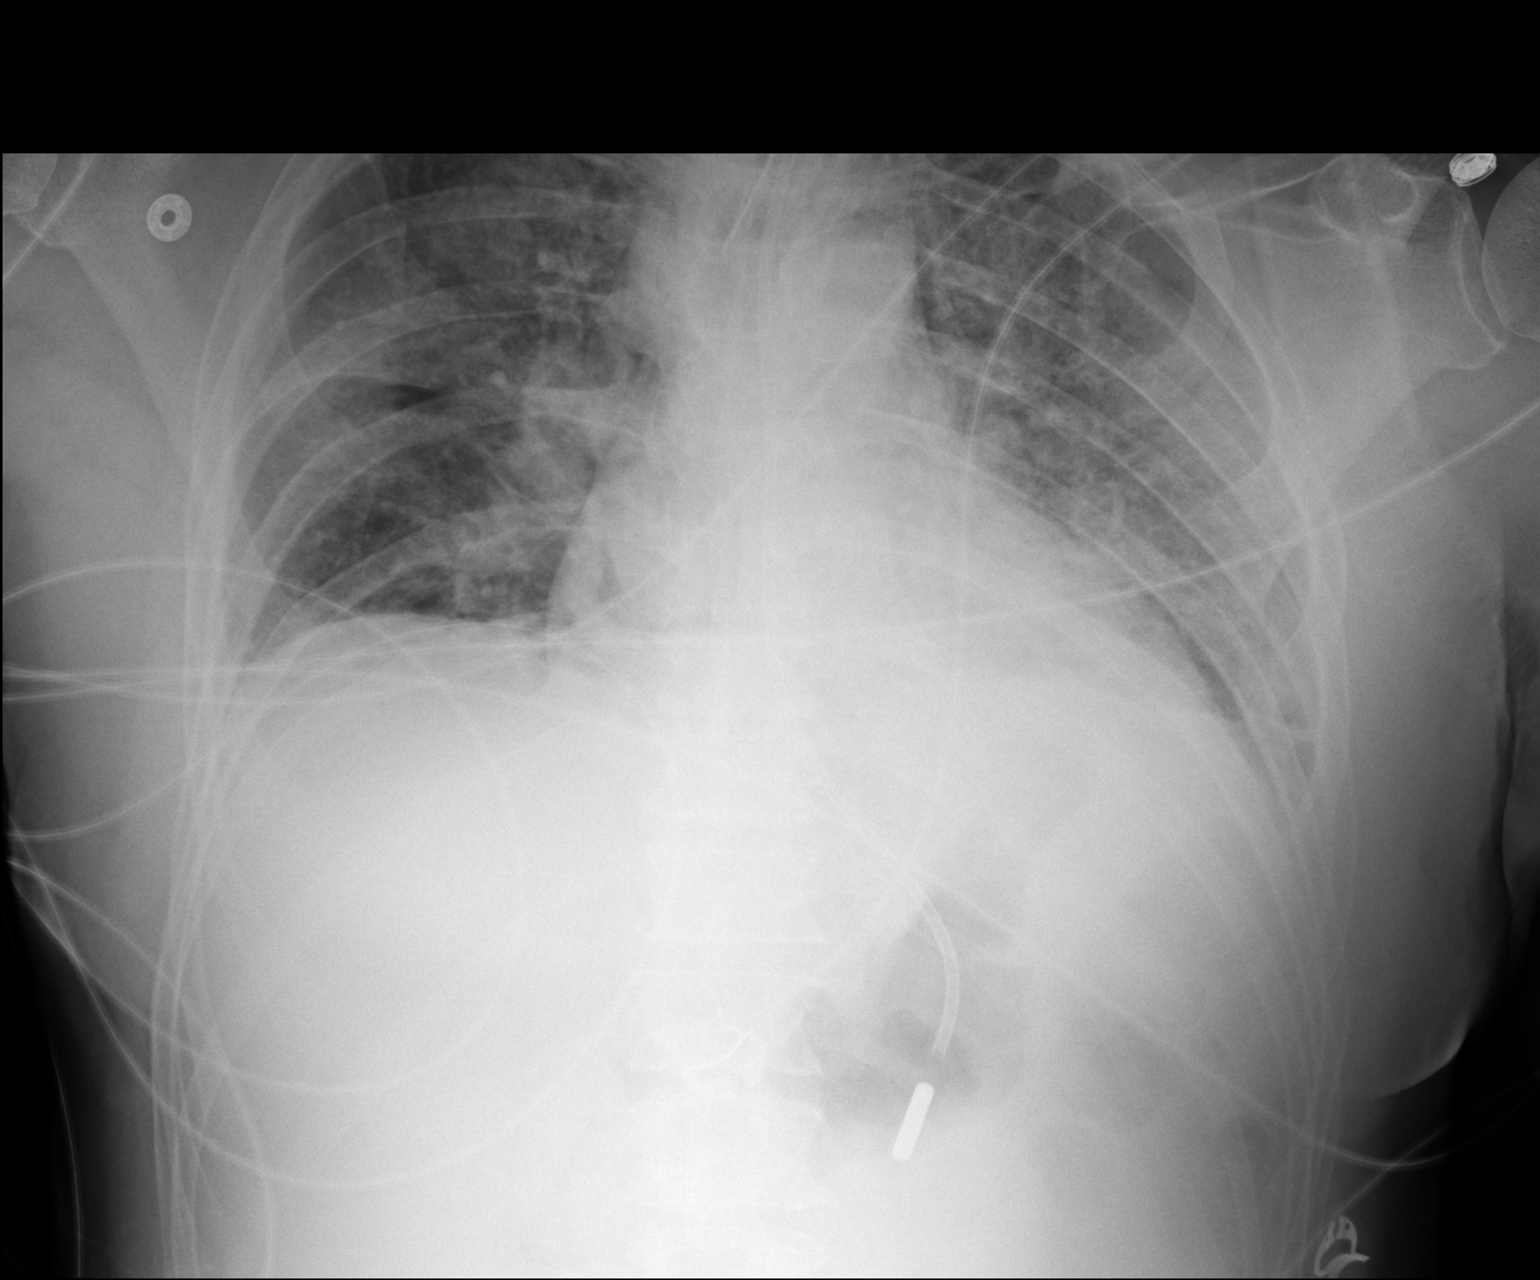

[1 of 1 positions shown; findings below may reference images not displayed]

FINDINGS: Endotracheal tube placed with tip measuring 2.6 cm above the
carinal. Enteric tube placed with tip over the left upper quadrant
consistent with location in the mid stomach. Shallow inspiration
with atelectasis in the lung bases. Cardiac enlargement with
pulmonary vascular congestion and bilateral perihilar infiltration
suggesting edema or pneumonia. No blunting of costophrenic angles.
No pneumothorax.
IMPRESSION: Appliances appear in satisfactory location. Cardiac enlargement with
pulmonary vascular congestion and bilateral perihilar infiltration
again noted.

## 2019-04-14 ENCOUNTER — Ambulatory Visit: Payer: Self-pay | Admitting: Internal Medicine

## 2019-05-01 ENCOUNTER — Encounter: Payer: Self-pay | Admitting: Internal Medicine

## 2019-05-01 ENCOUNTER — Other Ambulatory Visit: Payer: Self-pay

## 2019-05-01 ENCOUNTER — Ambulatory Visit: Payer: Self-pay | Attending: Internal Medicine | Admitting: Internal Medicine

## 2019-05-01 VITALS — BP 115/77 | HR 99 | Temp 96.8°F | Resp 16 | Ht 73.0 in | Wt 239.6 lb

## 2019-05-01 DIAGNOSIS — R251 Tremor, unspecified: Secondary | ICD-10-CM | POA: Insufficient documentation

## 2019-05-01 DIAGNOSIS — F319 Bipolar disorder, unspecified: Secondary | ICD-10-CM | POA: Insufficient documentation

## 2019-05-01 DIAGNOSIS — Z1211 Encounter for screening for malignant neoplasm of colon: Secondary | ICD-10-CM | POA: Insufficient documentation

## 2019-05-01 DIAGNOSIS — I10 Essential (primary) hypertension: Secondary | ICD-10-CM | POA: Insufficient documentation

## 2019-05-01 DIAGNOSIS — Z7689 Persons encountering health services in other specified circumstances: Secondary | ICD-10-CM

## 2019-05-01 DIAGNOSIS — Z87820 Personal history of traumatic brain injury: Secondary | ICD-10-CM | POA: Insufficient documentation

## 2019-05-01 DIAGNOSIS — F17200 Nicotine dependence, unspecified, uncomplicated: Secondary | ICD-10-CM | POA: Insufficient documentation

## 2019-05-01 DIAGNOSIS — F1010 Alcohol abuse, uncomplicated: Secondary | ICD-10-CM | POA: Insufficient documentation
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
## 2019-05-01 NOTE — Patient Instructions (Signed)
Please get the forms for the orange card/cone discount card and applied for them as soon as possible.  You were due for the second shot of the maternal vaccine on 04/25/2019.  You should call the health department and try to get this vaccine scheduled as soon as possible.

## 2019-05-02 ENCOUNTER — Telehealth: Payer: Self-pay

## 2019-05-02 LAB — CBC
Hematocrit: 47.1 % (ref 37.5–51.0)
Hemoglobin: 15.9 g/dL (ref 13.0–17.7)
MCH: 33.5 pg — ABNORMAL HIGH (ref 26.6–33.0)
MCHC: 33.8 g/dL (ref 31.5–35.7)
MCV: 99 fL — ABNORMAL HIGH (ref 79–97)
Platelets: 258 10*3/uL (ref 150–450)
RBC: 4.74 x10E6/uL (ref 4.14–5.80)
RDW: 13.8 % (ref 11.6–15.4)
WBC: 5.6 10*3/uL (ref 3.4–10.8)

## 2019-05-02 LAB — COMPREHENSIVE METABOLIC PANEL
ALT: 9 IU/L (ref 0–44)
AST: 15 IU/L (ref 0–40)
Albumin/Globulin Ratio: 1.5 (ref 1.2–2.2)
Albumin: 4.3 g/dL (ref 3.8–4.8)
Alkaline Phosphatase: 78 IU/L (ref 39–117)
BUN/Creatinine Ratio: 10 (ref 10–24)
BUN: 9 mg/dL (ref 8–27)
Bilirubin Total: 0.4 mg/dL (ref 0.0–1.2)
CO2: 22 mmol/L (ref 20–29)
Calcium: 9.6 mg/dL (ref 8.6–10.2)
Chloride: 106 mmol/L (ref 96–106)
Creatinine, Ser: 0.91 mg/dL (ref 0.76–1.27)
GFR calc Af Amer: 105 mL/min/{1.73_m2} (ref >59)
GFR calc non Af Amer: 91 mL/min/{1.73_m2} (ref >59)
Globulin, Total: 2.8 g/dL (ref 1.5–4.5)
Glucose: 85 mg/dL (ref 65–99)
Potassium: 4.4 mmol/L (ref 3.5–5.2)
Sodium: 143 mmol/L (ref 134–144)
Total Protein: 7.1 g/dL (ref 6.0–8.5)

## 2019-05-02 LAB — LIPID PANEL
Chol/HDL Ratio: 4.3 ratio (ref 0.0–5.0)
Cholesterol, Total: 215 mg/dL — ABNORMAL HIGH (ref 100–199)
HDL: 50 mg/dL (ref >39)
LDL Chol Calc (NIH): 100 mg/dL — ABNORMAL HIGH (ref 0–99)
Triglycerides: 387 mg/dL — ABNORMAL HIGH (ref 0–149)
VLDL Cholesterol Cal: 65 mg/dL — ABNORMAL HIGH (ref 5–40)

## 2019-05-02 NOTE — Progress Notes (Signed)
Let patient know that his blood count is normal meaning no anemia.  His blood sugar, kidney and liver function tests are normal.  His total cholesterol and LDL cholesterol are elevated.  This puts him at increased risk for heart attack and strokes.  Healthy eating habits and regular exercise will help to lower cholesterol.  We will recheck his cholesterol level in several months.  If still elevated I would recommend starting medication to help lower the cholesterol.  The 10-year ASCVD risk score Denman George DC Montez Hageman., et al., 2013) is: 12.8%   Values used to calculate the score:     Age: 64 years     Sex: Male     Is Non-Hispanic African American: No     Diabetic: No     Tobacco smoker: Yes     Systolic Blood Pressure: 115 mmHg     Is BP treated: No     HDL Cholesterol: 50 mg/dL     Total Cholesterol: 215 mg/dL

## 2019-05-02 NOTE — Telephone Encounter (Signed)
Contacted pt to go over lab results was unable to reach pt due to call can't be completed at this time. Contacted pt mother and per pt mother she doesn't have another number and he doesn't live with her

## 2019-05-28 ENCOUNTER — Ambulatory Visit: Payer: Self-pay | Attending: Internal Medicine

## 2019-05-28 ENCOUNTER — Other Ambulatory Visit: Payer: Self-pay

## 2019-07-04 ENCOUNTER — Other Ambulatory Visit: Payer: Self-pay

## 2019-07-04 ENCOUNTER — Ambulatory Visit: Payer: Self-pay | Attending: Internal Medicine | Admitting: Internal Medicine

## 2019-07-04 DIAGNOSIS — F319 Bipolar disorder, unspecified: Secondary | ICD-10-CM

## 2019-07-04 DIAGNOSIS — R21 Rash and other nonspecific skin eruption: Secondary | ICD-10-CM

## 2019-07-04 DIAGNOSIS — M79605 Pain in left leg: Secondary | ICD-10-CM

## 2019-07-04 NOTE — Progress Notes (Signed)
Having swelling in left leg states that it is very painful and it develops a lump at times  Very hard to walk.
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
## 2019-07-22 ENCOUNTER — Ambulatory Visit (HOSPITAL_COMMUNITY): Payer: Federal, State, Local not specified - Other | Admitting: Psychiatry

## 2019-07-23 ENCOUNTER — Telehealth (HOSPITAL_COMMUNITY): Payer: Federal, State, Local not specified - Other | Admitting: Psychiatry

## 2019-07-24 ENCOUNTER — Ambulatory Visit: Payer: Self-pay | Admitting: Family

## 2019-08-05 ENCOUNTER — Ambulatory Visit: Payer: Medicaid Other | Attending: Internal Medicine | Admitting: Internal Medicine

## 2019-08-05 ENCOUNTER — Encounter: Payer: Self-pay | Admitting: Internal Medicine

## 2019-08-05 DIAGNOSIS — M79605 Pain in left leg: Secondary | ICD-10-CM | POA: Diagnosis not present

## 2019-08-05 DIAGNOSIS — R21 Rash and other nonspecific skin eruption: Secondary | ICD-10-CM | POA: Diagnosis not present

## 2019-08-05 MED ORDER — ECONAZOLE NITRATE 1 % EX CREA: TOPICAL_CREAM | Freq: Two times a day (BID) | CUTANEOUS | 1 refills | Status: AC

## 2019-08-05 NOTE — Progress Notes (Signed)
   Virtual Visit via Telephone Note  I connected with Justin Pugh on 08/05/19 at  2:30 PM EDT by telephone and verified that I am speaking with the correct person using two identifiers.  Location: Patient: [place] Provider: home   I discussed the limitations, risks, security and privacy concerns of performing an evaluation and management service by telephone and the availability of in person appointments. I also discussed with the patient that there may be a patient responsible charge related to this service. The patient expressed understanding and agreed to proceed.   History of Present Illness: Pt presents for f/u pain and swelling in distal left leg. He reports symptoms have improved with reduced standing/walking. He states he had to walk a long distance to and from work with prolonged standing at work. Sx improved now. Denies sob, c/p, swelling or discoloration in leg.  He also req refill of topical cream he uses on his foot. Rash between toes and on top of foot. Sx had improved with cream, but has run out.    Observations/Objective:   Assessment and Plan: 1. Pain of left lower extremity Improved with reduced standing/walking, monitor sx, rv if sx recur  2. Rash of foot prob tinea, rx econazole cr bid, advised to keep foot clean and dry, rv if sx not improved over the next 2 weeks with rx or if sx worsen    Follow Up Instructions:    I discussed the assessment and treatment plan with the patient. The patient was provided an opportunity to ask questions and all were answered. The patient agreed with the plan and demonstrated an understanding of the instructions.   The patient was advised to call back or seek an in-person evaluation if the symptoms worsen or if the condition fails to improve as anticipated.  I provided 20 minutes of non-face-to-face time during this encounter.   Debby Bud, MD

## 2019-08-05 NOTE — Progress Notes (Signed)
F/u on his Left leg swelling Needs a refills on his Kenalog ointment  Pt stated swelling has not reoccurred since he  stopped doing long walks or standing log periods of time

## 2019-08-09 NOTE — Progress Notes (Signed)
Patient did not show for appointment.   

## 2019-08-12 ENCOUNTER — Ambulatory Visit (HOSPITAL_BASED_OUTPATIENT_CLINIC_OR_DEPARTMENT_OTHER): Payer: Medicaid Other | Admitting: Family

## 2019-08-12 DIAGNOSIS — Z5329 Procedure and treatment not carried out because of patient's decision for other reasons: Secondary | ICD-10-CM

## 2021-08-11 ENCOUNTER — Telehealth: Payer: Self-pay | Admitting: Internal Medicine

## 2021-08-11 NOTE — Telephone Encounter (Signed)
Copied from CRM [id number]. Topic: General - Other >> Aug 11, 2021  2:46 PM Ja-Kwan M wrote: Reason for CRM: Pt stated he used to have someone to come out to assist with his appts and medications bet they are no longer doing that so he needs to speak with someone to help him reinstated those services. Pt requests call back asap at [phone]

## 2021-08-11 NOTE — Telephone Encounter (Signed)
Call returned to patient. He explained that he used to have help with his medications and medical problems and he no longer has that help. He could not tell me who or what agency used to help him. He also said that he needs a referral to eye doctor and dentist.  He has not bee seen by a provider at one of our clinics in 2 years and he wants to re-establish care.  He has moved and his current address is updated in Epic. Scheduled him at [facility] - 08/30/2021.  He said he still has Medicaid and I gave him the phone number for DSS to register for transportation and schedule rides. I also provided him with the address and phone number for [facility].

## 2021-08-30 ENCOUNTER — Ambulatory Visit (INDEPENDENT_AMBULATORY_CARE_PROVIDER_SITE_OTHER): Payer: Medicaid Other | Admitting: Primary Care

## 2021-09-29 ENCOUNTER — Ambulatory Visit (INDEPENDENT_AMBULATORY_CARE_PROVIDER_SITE_OTHER): Payer: Medicaid Other | Admitting: Primary Care

## 2021-09-29 VITALS — BP 147/90 | HR 87 | Temp 98.2°F | Ht 73.0 in | Wt 241.8 lb

## 2021-09-29 DIAGNOSIS — Z7689 Persons encountering health services in other specified circumstances: Secondary | ICD-10-CM

## 2021-09-29 DIAGNOSIS — R7303 Prediabetes: Secondary | ICD-10-CM | POA: Diagnosis not present

## 2021-09-29 DIAGNOSIS — I10 Essential (primary) hypertension: Secondary | ICD-10-CM | POA: Diagnosis not present

## 2021-09-29 DIAGNOSIS — N489 Disorder of penis, unspecified: Secondary | ICD-10-CM

## 2021-09-29 DIAGNOSIS — Z131 Encounter for screening for diabetes mellitus: Secondary | ICD-10-CM

## 2021-09-29 LAB — POCT GLYCOSYLATED HEMOGLOBIN (HGB A1C): Hemoglobin A1C: 5.9 % — AB (ref 4.0–5.6)

## 2021-09-29 NOTE — Progress Notes (Signed)
New Patient Office Visit  Subjective    Patient ID: Justin Pugh, male    DOB: 1955/08/05  Age: 66 y.o. MRN: [id number]  CC:  Chief Complaint  Patient presents with   New Patient (Initial Visit)    Issues with penis     HPI Justin Pugh is a 66 year old male  presents to establish care. His concerns today includes issues with his "winner" it is getting smaller and rash on it. He saw a doctor before about this and was given medication that help. He is requesting refills.    Outpatient Encounter Medications as of 09/29/2021  Medication Sig   divalproex (DEPAKOTE) 500 MG DR tablet Take 500 mg by mouth 3 (three) times daily. (Patient not taking: Reported on 09/29/2021)   econazole nitrate 1 % cream Apply topically 2 (two) times daily. (Patient not taking: Reported on 09/29/2021)   triamcinolone cream (KENALOG) 0.1 % Apply 1 application topically 2 (two) times daily. (Patient not taking: Reported on 09/29/2021)   No facility-administered encounter medications on file as of 09/29/2021.    No past medical history on file.  Past Surgical History:  Procedure Laterality Date   ESOPHAGOGASTRODUODENOSCOPY N/A 04/06/2014   Procedure: ESOPHAGOGASTRODUODENOSCOPY (EGD);  Surgeon: Frederik Schmidt, MD;  Location: [facility] ENDOSCOPY;  Service: General;  Laterality: N/A;   PEG PLACEMENT N/A 04/06/2014   Procedure: PERCUTANEOUS ENDOSCOPIC GASTROSTOMY (PEG) PLACEMENT;  Surgeon: Frederik Schmidt, MD;  Location: [facility] ENDOSCOPY;  Service: General;  Laterality: N/A;   RADIOLOGY WITH ANESTHESIA N/A 04/01/2014   Procedure: MRI BRAIN WITHOUT CONTRAST /RADIOLOGY WITH ANESTHESIA;  Surgeon: Medication Radiologist, MD;  Location: [facility] OR;  Service: Radiology;  Laterality: N/A;    No family history on file.  Social History   Socioeconomic History   Marital status: Unknown    Spouse name: Not on file   Number of children: Not on file   Years of education: Not on file   Highest education level: Not on file  Occupational  History   Not on file  Tobacco Use   Smoking status: Every Day    Packs/day: 0.05    Types: Cigarettes   Smokeless tobacco: Never  Vaping Use   Vaping Use: Never used  Substance and Sexual Activity   Alcohol use: Yes    Comment: ocasionaly    Drug use: Never   Sexual activity: Not on file  Other Topics Concern   Not on file  Social History Narrative   ** Merged History Encounter **       Social Determinants of Health   Financial Resource Strain: Not on file  Food Insecurity: Not on file  Transportation Needs: Not on file  Physical Activity: Not on file  Stress: Not on file  Social Connections: Not on file  Intimate Partner Violence: Not on file    ROS Comprehensive ROS Pertinent positive and negative noted in HPI       Objective    BP (!) 147/90   Pulse 87   Temp 98.2 F (36.8 C) (Oral)   Ht 6\' 1"  (1.854 m)   Wt 241 lb 12.8 oz (109.7 kg)   SpO2 96%   BMI 31.90 kg/m   Physical Exam Vitals reviewed.  Constitutional:      Appearance: He is obese.  HENT:     Right Ear: External ear normal.     Left Ear: External ear normal.  Eyes:     Extraocular Movements: Extraocular movements intact.  Pupils: Pupils are equal, round, and reactive to light.  Cardiovascular:     Rate and Rhythm: Normal rate and regular rhythm.  Pulmonary:     Effort: Pulmonary effort is normal.     Breath sounds: Normal breath sounds.  Abdominal:     General: Bowel sounds are normal. There is distension.     Palpations: Abdomen is soft.  Genitourinary:    Penis: Uncircumcised. Erythema, tenderness and swelling present.      Comments: Between the head and shaft is a black ring (separating ) Please see picture Musculoskeletal:        General: Normal range of motion.     Cervical back: Normal range of motion and neck supple.       Assessment & Plan:  Justin Pugh was seen today for new patient (initial visit).  Diagnoses and all orders for this visit:  Screening for diabetes  mellitus -     HgB A1c 5.9  Penis symptom or sign    5.9 Encourage patient to be seen by emergency room patient refused.  Signed a AMA will refer to urology  Encounter to establish care Establish care with PCP  Prediabetes New diagnosisDiscussed being prediabetic monitoring carbohydrates and encourage exercising.  Examples given reduction in rice, potatoes, bread, sodas, sweets, and sugars  Essential hypertension Blood pressure was elevated but at this time patient was agitated and ready to leave walked out the door No follow-ups on file.   Grayce Sessions, NP

## 2021-09-30 ENCOUNTER — Other Ambulatory Visit: Payer: Self-pay

## 2021-09-30 ENCOUNTER — Emergency Department (HOSPITAL_COMMUNITY)
Admission: EM | Admit: 2021-09-30 | Discharge: 2021-09-30 | Disposition: A | Discharge status: Home / Self Care | Payer: Medicaid Other | Attending: Emergency Medicine | Admitting: Emergency Medicine

## 2021-09-30 ENCOUNTER — Encounter (HOSPITAL_COMMUNITY): Payer: Self-pay

## 2021-09-30 DIAGNOSIS — S30842A External constriction of penis, initial encounter: Secondary | ICD-10-CM | POA: Insufficient documentation

## 2021-09-30 DIAGNOSIS — W4904XA Ring or other jewelry causing external constriction, initial encounter: Secondary | ICD-10-CM | POA: Diagnosis not present

## 2021-09-30 LAB — URINALYSIS, ROUTINE W REFLEX MICROSCOPIC
Bilirubin Urine: NEGATIVE
Glucose, UA: NEGATIVE mg/dL
Hgb urine dipstick: NEGATIVE
Ketones, ur: NEGATIVE mg/dL
Leukocytes,Ua: NEGATIVE
Nitrite: NEGATIVE
Protein, ur: NEGATIVE mg/dL
Specific Gravity, Urine: 1.016 (ref 1.005–1.030)
pH: 5 (ref 5.0–8.0)

## 2021-09-30 LAB — I-STAT CHEM 8, ED
BUN: 10 mg/dL (ref 8–23)
Calcium, Ion: 1.16 mmol/L (ref 1.15–1.40)
Chloride: 104 mmol/L (ref 98–111)
Creatinine, Ser: 1 mg/dL (ref 0.61–1.24)
Glucose, Bld: 97 mg/dL (ref 70–99)
HCT: 52 % (ref 39.0–52.0)
Hemoglobin: 17.7 g/dL — ABNORMAL HIGH (ref 13.0–17.0)
Potassium: 4.6 mmol/L (ref 3.5–5.1)
Sodium: 140 mmol/L (ref 135–145)
TCO2: 25 mmol/L (ref 22–32)

## 2021-09-30 NOTE — ED Provider Triage Note (Signed)
Emergency Medicine Provider Triage Evaluation Note  Justin Pugh , a 66 y.o. male  was evaluated in triage.  Pt complains of talking on medication as well as complications with his penis.  He states he has not been taking his regular at home medications for the past year including antihypertensives and antiseizure medicines.  He states that he is had a "ring around his penis" for approximately 8 years that is getting smaller and smaller as well as more painful.  He was seen by his primary care yesterday and was told to come to the emergency department for evaluation.  He notes increased difficulty with urinating due to the decreasing size of the ring.  Denies any dysuria.  Review of Systems  Positive: See above Negative:   Physical Exam  BP (!) 144/110 (BP Location: Left Arm)   Pulse 93   Temp 98.4 F (36.9 C) (Oral)   Resp 17   Ht 6\' 1"  (1.854 m)   Wt 109.7 kg   SpO2 96%   BMI 31.90 kg/m  Gen:   Awake, no distress   Resp:  Normal effort  MSK:   Moves extremities without difficulty  Other:  Very small hair tourniquet noticed around shaft of patient's penis just proximal to glans penis.  See media from yesterday from family practice for picture.  Medical Decision Making  Medically screening exam initiated at 6:01 PM.  Appropriate orders placed.  Justin Pugh was informed that the remainder of the evaluation will be completed by another provider, this initial triage assessment does not replace that evaluation, and the importance of remaining in the ED until their evaluation is complete.     [name], Peter Garter 09/30/21 [phone]

## 2021-09-30 NOTE — ED Triage Notes (Addendum)
Per EMS- patient called out for a sick cal and when they arrived, patient stated he had a genital problem and would not elaborate except it has been going on multiple years.  Patient did say that he went to a doctor yesterday and was told to come to the ED for further evaluation.  Patient added that he has problems urinating "because it hurts when it is coming out."

## 2021-09-30 NOTE — Discharge Instructions (Addendum)
Make an appointment to have close follow-up with the urologist listed above.  Return to the emergency room if you have any worsening symptoms.

## 2021-09-30 NOTE — ED Notes (Signed)
Bladder scan 78 ml

## 2021-09-30 NOTE — ED Provider Notes (Signed)
[facility]-EMERGENCY DEPT Provider Note   CSN: [id number] Arrival date & time: 09/30/21  1636     History  Chief Complaint  Patient presents with   Hypertension   genital issue    ISAUL LANDI is a 66 y.o. male.  Patient is a 66 year old male who presents with a ring around his penis.  He says been there for about 8 years.  He says its recently been getting more painful and he is having more difficulty urinating.  He denies any fevers.  No pain on urination.  No abdominal pain.  He does state that he has not been taking his medications including antiepileptics and antihypertensive medications for several months.       Home Medications Prior to Admission medications   Medication Sig Start Date End Date Taking? Authorizing Provider  divalproex (DEPAKOTE) 500 MG DR tablet Take 500 mg by mouth 3 (three) times daily. Patient not taking: Reported on 09/29/2021    [provider]  econazole nitrate 1 % cream Apply topically 2 (two) times daily. Patient not taking: Reported on 09/29/2021 08/05/19   Debby Bud, MD  triamcinolone cream (KENALOG) 0.1 % Apply 1 application topically 2 (two) times daily. Patient not taking: Reported on 09/29/2021    [provider]      Allergies    Ativan [lorazepam]    Review of Systems   Review of Systems  Constitutional:  Negative for chills, diaphoresis, fatigue and fever.  HENT:  Negative for congestion, rhinorrhea and sneezing.   Eyes: Negative.   Respiratory:  Negative for cough, chest tightness and shortness of breath.   Cardiovascular:  Negative for chest pain and leg swelling.  Gastrointestinal:  Negative for abdominal pain, blood in stool, diarrhea, nausea and vomiting.  Genitourinary:  Positive for decreased urine volume, difficulty urinating and penile swelling. Negative for flank pain, frequency, hematuria and penile discharge.  Musculoskeletal:  Negative for arthralgias and back pain.   Skin:  Negative for rash.  Neurological:  Negative for dizziness, speech difficulty, weakness, numbness and headaches.    Physical Exam Updated Vital Signs BP (!) 165/85 (BP Location: Left Arm)   Pulse 100   Temp 98.2 F (36.8 C)   Resp 17   Ht 6\' 1"  (1.854 m)   Wt 109.7 kg   SpO2 97%   BMI 31.90 kg/m  Physical Exam Constitutional:      Appearance: He is well-developed.  HENT:     Head: Normocephalic and atraumatic.  Eyes:     Pupils: Pupils are equal, round, and reactive to light.  Cardiovascular:     Rate and Rhythm: Normal rate and regular rhythm.     Heart sounds: Normal heart sounds.  Pulmonary:     Effort: Pulmonary effort is normal. No respiratory distress.     Breath sounds: Normal breath sounds. No wheezing or rales.  Chest:     Chest wall: No tenderness.  Abdominal:     General: Bowel sounds are normal.     Palpations: Abdomen is soft.     Tenderness: There is no abdominal tenderness. There is no guarding or rebound.  Genitourinary:    Comments: Appears to be a large hair tourniquet around the shaft of the penis with very narrow constriction of the shaft of the penis. Musculoskeletal:        General: Normal range of motion.     Cervical back: Normal range of motion and neck supple.  Lymphadenopathy:  Cervical: No cervical adenopathy.  Skin:    General: Skin is warm and dry.     Findings: No rash.  Neurological:     Mental Status: He is alert and oriented to person, place, and time.      ED Results / Procedures / Treatments   Labs (all labs ordered are listed, but only abnormal results are displayed) Labs Reviewed  I-STAT CHEM 8, ED - Abnormal; Notable for the following components:      Result Value   Hemoglobin 17.7 (*)    All other components within normal limits  URINALYSIS, ROUTINE W REFLEX MICROSCOPIC    EKG None  Radiology No results found.  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  It appears to be some sort of hair tourniquet.  Per his report, its been there for about 8 years.  He says it is getting more difficult to urinate although he has been able to urinate in the ED and he does not have any urinary retention on bladder scan.  The tip of his penis is pink and appears to be well vascularized.  His urine does not appear to be infected based on urinalysis.  His kidney function is normal based on blood work.  I spoke with Dr. Mena Goes with urology.  He recommends that since this is more of a chronic issue, that patient follow-up in the office for definitive care.  I discussed this with the patient.  At this point he does not have indication for hospitalization.  He was discharged home in good condition.  Return precautions were given.  He was given information about making appointment to follow-up with Dr. Mena Goes.  {Final Clinical Impression(s) / ED Diagnoses Final diagnoses:  Hair tourniquet of penis, initial encounter    Rx / DC Orders ED Discharge Orders     None         Rolan Bucco, MD 09/30/21 2129

## 2021-10-10 ENCOUNTER — Encounter: Payer: Self-pay | Admitting: *Deleted
